# Patient Record
Sex: Female | Born: 1935 | Race: Black or African American | Hispanic: No | Smoking: Never smoker
Health system: Southern US, Community
[De-identification: ages and names within clinical notes are randomized; demographics above are authoritative.]

## PROBLEM LIST (undated history)

## (undated) DIAGNOSIS — J449 Chronic obstructive pulmonary disease, unspecified: Secondary | ICD-10-CM

## (undated) DIAGNOSIS — F0390 Unspecified dementia without behavioral disturbance: Secondary | ICD-10-CM

## (undated) DIAGNOSIS — I10 Essential (primary) hypertension: Secondary | ICD-10-CM

## (undated) DIAGNOSIS — S43005A Unspecified dislocation of left shoulder joint, initial encounter: Secondary | ICD-10-CM

## (undated) HISTORY — PX: OOPHORECTOMY: SHX86

## (undated) HISTORY — DX: Unspecified dislocation of left shoulder joint, initial encounter: S43.005A

---

## 2007-04-27 ENCOUNTER — Ambulatory Visit: Payer: Self-pay | Admitting: Emergency Medicine

## 2007-05-22 ENCOUNTER — Ambulatory Visit: Payer: Self-pay | Admitting: Internal Medicine

## 2007-06-01 ENCOUNTER — Ambulatory Visit: Payer: Self-pay | Admitting: Internal Medicine

## 2010-02-02 ENCOUNTER — Ambulatory Visit: Payer: Self-pay | Admitting: Internal Medicine

## 2010-02-02 ENCOUNTER — Inpatient Hospital Stay: Payer: Self-pay | Admitting: Internal Medicine

## 2010-10-18 ENCOUNTER — Ambulatory Visit: Payer: Self-pay | Admitting: Family Medicine

## 2010-11-05 ENCOUNTER — Ambulatory Visit: Payer: Self-pay | Admitting: Family Medicine

## 2010-11-18 ENCOUNTER — Emergency Department: Payer: Self-pay | Admitting: Emergency Medicine

## 2010-11-24 ENCOUNTER — Emergency Department: Payer: Self-pay | Admitting: Emergency Medicine

## 2010-12-21 ENCOUNTER — Ambulatory Visit: Payer: Self-pay | Admitting: Surgery

## 2010-12-28 ENCOUNTER — Ambulatory Visit: Payer: Self-pay | Admitting: Surgery

## 2011-04-29 ENCOUNTER — Ambulatory Visit: Payer: Self-pay | Admitting: Family Medicine

## 2012-11-14 ENCOUNTER — Emergency Department: Payer: Self-pay | Admitting: Emergency Medicine

## 2012-11-14 LAB — CBC
MCH: 27.5 pg (ref 26.0–34.0)
MCV: 84 fL (ref 80–100)
Platelet: 169 10*3/uL (ref 150–440)
RDW: 14.8 % — ABNORMAL HIGH (ref 11.5–14.5)
WBC: 6.5 10*3/uL (ref 3.6–11.0)

## 2012-11-14 LAB — COMPREHENSIVE METABOLIC PANEL
Albumin: 4.1 g/dL (ref 3.4–5.0)
Anion Gap: 7 (ref 7–16)
BUN: 12 mg/dL (ref 7–18)
Bilirubin,Total: 0.5 mg/dL (ref 0.2–1.0)
Chloride: 101 mmol/L (ref 98–107)
EGFR (African American): >60
Osmolality: 274 (ref 275–301)
Potassium: 3.7 mmol/L (ref 3.5–5.1)
SGOT(AST): 26 U/L (ref 15–37)
SGPT (ALT): 23 U/L (ref 12–78)
Sodium: 136 mmol/L (ref 136–145)
Total Protein: 7.8 g/dL (ref 6.4–8.2)

## 2012-11-14 LAB — URINALYSIS, COMPLETE
Bacteria: NONE SEEN
Blood: NEGATIVE
Leukocyte Esterase: NEGATIVE
Ph: 6 (ref 4.5–8.0)
Protein: 30
RBC,UR: 5 /HPF (ref 0–5)
Squamous Epithelial: <1
WBC UR: 2 /HPF (ref 0–5)

## 2012-11-14 LAB — CK TOTAL AND CKMB (NOT AT ARMC): CK, Total: 109 U/L (ref 21–215)

## 2016-04-08 ENCOUNTER — Emergency Department: Payer: Medicare Other

## 2016-04-08 ENCOUNTER — Emergency Department
Admission: EM | Admit: 2016-04-08 | Discharge: 2016-04-08 | Disposition: A | Discharge status: Home / Self Care | Payer: Medicare Other | Attending: Student | Admitting: Student

## 2016-04-08 ENCOUNTER — Encounter: Payer: Self-pay | Admitting: Emergency Medicine

## 2016-04-08 DIAGNOSIS — I10 Essential (primary) hypertension: Secondary | ICD-10-CM | POA: Diagnosis present

## 2016-04-08 DIAGNOSIS — R05 Cough: Secondary | ICD-10-CM | POA: Insufficient documentation

## 2016-04-08 DIAGNOSIS — J449 Chronic obstructive pulmonary disease, unspecified: Secondary | ICD-10-CM | POA: Insufficient documentation

## 2016-04-08 DIAGNOSIS — R059 Cough, unspecified: Secondary | ICD-10-CM

## 2016-04-08 HISTORY — DX: Chronic obstructive pulmonary disease, unspecified: J44.9

## 2016-04-08 HISTORY — DX: Essential (primary) hypertension: I10

## 2016-04-08 LAB — CBC
HEMATOCRIT: 40.2 % (ref 35.0–47.0)
HEMOGLOBIN: 13 g/dL (ref 12.0–16.0)
MCH: 27.2 pg (ref 26.0–34.0)
MCHC: 32.4 g/dL (ref 32.0–36.0)
MCV: 83.9 fL (ref 80.0–100.0)
Platelets: 176 10*3/uL (ref 150–440)
RBC: 4.79 MIL/uL (ref 3.80–5.20)
RDW: 14.9 % — ABNORMAL HIGH (ref 11.5–14.5)
WBC: 6.2 10*3/uL (ref 3.6–11.0)

## 2016-04-08 LAB — URINALYSIS COMPLETE WITH MICROSCOPIC (ARMC ONLY)
Bacteria, UA: NONE SEEN
Bilirubin Urine: NEGATIVE
Glucose, UA: NEGATIVE mg/dL
Hgb urine dipstick: NEGATIVE
Ketones, ur: NEGATIVE mg/dL
LEUKOCYTES UA: NEGATIVE
Nitrite: NEGATIVE
PH: 6 (ref 5.0–8.0)
PROTEIN: NEGATIVE mg/dL
SPECIFIC GRAVITY, URINE: 1.005 (ref 1.005–1.030)

## 2016-04-08 LAB — TROPONIN I: Troponin I: <0.03 ng/mL (ref <0.031)

## 2016-04-08 LAB — BASIC METABOLIC PANEL
Anion gap: 5 (ref 5–15)
BUN: 20 mg/dL (ref 6–20)
CO2: 30 mmol/L (ref 22–32)
Calcium: 9.5 mg/dL (ref 8.9–10.3)
Chloride: 105 mmol/L (ref 101–111)
Creatinine, Ser: 1.07 mg/dL — ABNORMAL HIGH (ref 0.44–1.00)
GFR calc Af Amer: 53 mL/min — ABNORMAL LOW (ref >60)
GFR calc non Af Amer: 46 mL/min — ABNORMAL LOW (ref >60)
GLUCOSE: 112 mg/dL — AB (ref 65–99)
Potassium: 3.7 mmol/L (ref 3.5–5.1)
Sodium: 140 mmol/L (ref 135–145)

## 2016-04-08 NOTE — ED Notes (Signed)
Pt presents with feelings of being drained and weak. States her bp has been elevated.

## 2016-04-08 NOTE — ED Provider Notes (Signed)
[facility] Emergency Department Provider Note  ____________________________________________  Time seen: Approximately 1:01 PM  I have reviewed the triage vital signs and the nursing notes.   HISTORY  Chief Complaint Hypertension    HPI Molly Parks is a 82 y.o. female with history of hypertension, GERD and COPD presents for evaluation of one week of intermittent hypertension, gradual onset, moderate, responsive to her antihypertensive medications. Patient reports that she checks her blood pressure daily. Over the past few days. she has seen a systolic as high as "180s". This typically resolves when she takes her antihypertensive medicines. Today she was concerned that her blood pressure was "too low to take my blood pressure medicines". For that reason, she presents to the emergency department. She denies any chest pain, difficulty breathing, headache, vision changes, numbness or weakness. No abdominal pain, vomiting, diarrhea. She has had some fatigue and cough productive of clear phlegm but denies any fevers.   Past Medical History  Diagnosis Date  . Hypertension   . COPD (chronic obstructive pulmonary disease) (HCC)     There are no active problems to display for this patient.   History reviewed. No pertinent past surgical history.  No current outpatient prescriptions on file.  Allergies Penicillins  No family history on file.  Social History Social History  Substance Use Topics  . Smoking status: Never Smoker   . Smokeless tobacco: None  . Alcohol Use: No    Review of Systems Constitutional: No fever/chills Eyes: No visual changes. ENT: No sore throat. Cardiovascular: Denies chest pain. Respiratory: Denies shortness of breath. Gastrointestinal: No abdominal pain.  No nausea, no vomiting.  No diarrhea.  No constipation. Genitourinary: Negative for dysuria. Musculoskeletal: Negative for back pain. Skin: Negative for rash. Neurological:  Negative for headaches, focal weakness or numbness.  10-point ROS otherwise negative.  ____________________________________________   PHYSICAL EXAM:  VITAL SIGNS: ED Triage Vitals  Enc Vitals Group     BP 04/08/16 1141 154/95 mmHg     Pulse Rate 04/08/16 1140 95     Resp 04/08/16 1140 18     Temp 04/08/16 1140 98 F (36.7 C)     Temp Source 04/08/16 1140 Oral     SpO2 04/08/16 1140 100 %     Weight 04/08/16 1140 147 lb (66.679 kg)     Height 04/08/16 1140  (1.753 m)     Head Cir --      Peak Flow --      Pain Score --      Pain Loc --      Pain Edu? --      Excl. in GC? --     Constitutional: Alert and oriented. Well appearing and in no acute distress. Eyes: Conjunctivae are normal. PERRL. EOMI. Head: Atraumatic. Nose: No congestion/rhinnorhea. Mouth/Throat: Mucous membranes are moist.  Oropharynx non-erythematous. Neck: No stridor. Supple without meningismus. Cardiovascular: Normal rate, regular rhythm. Grossly normal heart sounds.  Good peripheral circulation. Respiratory: Normal respiratory effort.  No retractions. Lungs CTAB. Gastrointestinal: Soft and nontender. No distention.  No CVA tenderness. Genitourinary: deferred Musculoskeletal: No lower extremity tenderness nor edema.  No joint effusions. Neurologic:  Normal speech and language. No gross focal neurologic deficits are appreciated. No gait instability. 5 out of 5 strength in bilateral upper and lower extremities, sensation intact to light touch throughout, cranial nerves II through XII intact. Skin:  Skin is warm, dry and intact. No rash noted. Psychiatric: Mood and affect are normal. Speech and behavior  are normal.  ____________________________________________   LABS (all labs ordered are listed, but only abnormal results are displayed)  Labs Reviewed  BASIC METABOLIC PANEL - Abnormal; Notable for the following:    Glucose, Bld 112 (*)    Creatinine, Ser 1.07 (*)    GFR calc non Af Amer 46 (*)     GFR calc Af Amer 53 (*)    All other components within normal limits  CBC - Abnormal; Notable for the following:    RDW 14.9 (*)    All other components within normal limits  URINALYSIS COMPLETEWITH MICROSCOPIC ([facility] ONLY) - Abnormal; Notable for the following:    Color, Urine YELLOW (*)    APPearance CLEAR (*)    Squamous Epithelial / LPF 0-5 (*)    All other components within normal limits  TROPONIN I   ____________________________________________  EKG  ED ECG REPORT I, Gayla DossGayle, Taevyn Hausen A, the attending physician, personally viewed and interpreted this ECG.   Date: 04/08/2016  EKG Time: 11:49  Rate: 89  Rhythm: normal sinus rhythm  Axis: normal  Intervals:none  ST&T Change: No acute ST elevation.  Motion artifact.  ____________________________________________  RADIOLOGY  CXR IMPRESSION: Negative chest. ____________________________________________   PROCEDURES  Procedure(s) performed: None  Critical Care performed: No  ____________________________________________   INITIAL IMPRESSION / ASSESSMENT AND PLAN / ED COURSE  Pertinent labs & imaging results that were available during my care of the patient were reviewed by me and considered in my medical decision making (see chart for details).  Molly Parks is a 82 y.o. female with history of hypertension, GERD and COPD presents for evaluation of one week of intermittent hypertension as well as cough. On exam, she is very well-appearing and in no acute distress. Vital signs stable, she is afebrile. She has mild to moderate hypertension here with systolic blood pressure ranging from 154-175 however she is asymptomatic. She has an intact neurological exam.BMP with mild guarding elevation 1.07, encouraged her to drink fluids. CBC remarkable troponin negative. Urinalysis consistent with infection. Chest x-ray shows no acute cardio pulmonary abnormality. No signs/symptoms supporting end organ dysfunction.  I discussed with  her that we would not make any alterations in her blood pressure medications at this time. We discussed that she will follow-up with her primary care doctor for reevaluation. We discussed return precautions, need for close follow-up and she is comfortable with the discharge plan. DC home. ____________________________________________   FINAL CLINICAL IMPRESSION(S) / ED DIAGNOSES  Final diagnoses:  Essential hypertension  Cough      Gayla DossEryka A Kinleigh Nault, MD 04/08/16 1528

## 2016-05-30 ENCOUNTER — Other Ambulatory Visit: Payer: Self-pay | Admitting: Family Medicine

## 2016-05-30 DIAGNOSIS — I714 Abdominal aortic aneurysm, without rupture, unspecified: Secondary | ICD-10-CM

## 2016-05-31 ENCOUNTER — Other Ambulatory Visit: Payer: Self-pay | Admitting: Family Medicine

## 2016-05-31 DIAGNOSIS — R1033 Periumbilical pain: Secondary | ICD-10-CM

## 2016-05-31 DIAGNOSIS — Z136 Encounter for screening for cardiovascular disorders: Secondary | ICD-10-CM

## 2016-06-06 ENCOUNTER — Ambulatory Visit: Admission: RE | Admit: 2016-06-06 | Payer: Medicare Other | Source: Ambulatory Visit

## 2016-09-12 ENCOUNTER — Other Ambulatory Visit: Payer: Self-pay | Admitting: Family Medicine

## 2016-09-12 DIAGNOSIS — Z780 Asymptomatic menopausal state: Secondary | ICD-10-CM

## 2017-01-07 ENCOUNTER — Emergency Department: Payer: Medicare Other

## 2017-01-07 ENCOUNTER — Emergency Department
Admission: EM | Admit: 2017-01-07 | Discharge: 2017-01-07 | Discharge status: Against Medical Advice | Payer: Medicare Other | Attending: Emergency Medicine | Admitting: Emergency Medicine

## 2017-01-07 ENCOUNTER — Encounter: Payer: Self-pay | Admitting: Intensive Care

## 2017-01-07 DIAGNOSIS — R112 Nausea with vomiting, unspecified: Secondary | ICD-10-CM | POA: Diagnosis not present

## 2017-01-07 DIAGNOSIS — I10 Essential (primary) hypertension: Secondary | ICD-10-CM | POA: Diagnosis not present

## 2017-01-07 DIAGNOSIS — I63431 Cerebral infarction due to embolism of right posterior cerebral artery: Secondary | ICD-10-CM | POA: Diagnosis not present

## 2017-01-07 DIAGNOSIS — R42 Dizziness and giddiness: Secondary | ICD-10-CM | POA: Diagnosis present

## 2017-01-07 DIAGNOSIS — R29810 Facial weakness: Secondary | ICD-10-CM

## 2017-01-07 DIAGNOSIS — J449 Chronic obstructive pulmonary disease, unspecified: Secondary | ICD-10-CM | POA: Diagnosis not present

## 2017-01-07 LAB — URINALYSIS, COMPLETE (UACMP) WITH MICROSCOPIC
BACTERIA UA: NONE SEEN
BILIRUBIN URINE: NEGATIVE
Glucose, UA: 50 mg/dL — AB
Hgb urine dipstick: NEGATIVE
KETONES UR: 20 mg/dL — AB
Nitrite: NEGATIVE
PROTEIN: 30 mg/dL — AB
SPECIFIC GRAVITY, URINE: 1.014 (ref 1.005–1.030)
pH: 7 (ref 5.0–8.0)

## 2017-01-07 LAB — COMPREHENSIVE METABOLIC PANEL
ALT: 10 U/L — ABNORMAL LOW (ref 14–54)
ANION GAP: 7 (ref 5–15)
AST: 20 U/L (ref 15–41)
Albumin: 3.9 g/dL (ref 3.5–5.0)
Alkaline Phosphatase: 42 U/L (ref 38–126)
BUN: 16 mg/dL (ref 6–20)
CHLORIDE: 104 mmol/L (ref 101–111)
CO2: 27 mmol/L (ref 22–32)
Calcium: 9 mg/dL (ref 8.9–10.3)
Creatinine, Ser: 0.99 mg/dL (ref 0.44–1.00)
GFR calc Af Amer: 58 mL/min — ABNORMAL LOW (ref >60)
GFR calc non Af Amer: 50 mL/min — ABNORMAL LOW (ref >60)
Glucose, Bld: 131 mg/dL — ABNORMAL HIGH (ref 65–99)
Potassium: 4.1 mmol/L (ref 3.5–5.1)
SODIUM: 138 mmol/L (ref 135–145)
Total Bilirubin: 0.8 mg/dL (ref 0.3–1.2)
Total Protein: 7 g/dL (ref 6.5–8.1)

## 2017-01-07 LAB — LIPASE, BLOOD: LIPASE: 18 U/L (ref 11–51)

## 2017-01-07 LAB — CBC
HEMATOCRIT: 41.7 % (ref 35.0–47.0)
HEMOGLOBIN: 13.9 g/dL (ref 12.0–16.0)
MCH: 28 pg (ref 26.0–34.0)
MCHC: 33.4 g/dL (ref 32.0–36.0)
MCV: 83.8 fL (ref 80.0–100.0)
Platelets: 208 10*3/uL (ref 150–440)
RBC: 4.98 MIL/uL (ref 3.80–5.20)
RDW: 13.7 % (ref 11.5–14.5)
WBC: 6.1 10*3/uL (ref 3.6–11.0)

## 2017-01-07 MED ORDER — MECLIZINE HCL 25 MG PO TABS
25.0000 mg | ORAL_TABLET | Freq: Once | ORAL | Status: AC
  Administered 2017-01-07: 25 mg via ORAL
  Filled 2017-01-07: qty 1

## 2017-01-07 MED ORDER — IOPAMIDOL (ISOVUE-370) INJECTION 76%
75.0000 mL | Freq: Once | INTRAVENOUS | Status: AC | PRN
Start: 2017-01-07 — End: 2017-01-07
  Administered 2017-01-07: 75 mL via INTRAVENOUS

## 2017-01-07 MED ORDER — MECLIZINE HCL 25 MG PO TABS: 25.0000 mg | ORAL_TABLET | Freq: Three times a day (TID) | ORAL | 0 refills | Status: DC | PRN

## 2017-01-07 MED ORDER — SODIUM CHLORIDE 0.9 % IV BOLUS (SEPSIS)
500.0000 mL | Freq: Once | INTRAVENOUS | Status: AC
  Administered 2017-01-07: 500 mL via INTRAVENOUS

## 2017-01-07 MED ORDER — ONDANSETRON HCL 4 MG/2ML IJ SOLN
4.0000 mg | Freq: Once | INTRAMUSCULAR | Status: AC
  Administered 2017-01-07: 4 mg via INTRAVENOUS
  Filled 2017-01-07: qty 2

## 2017-01-07 MED ORDER — ONDANSETRON 4 MG PO TBDP: 4.0000 mg | ORAL_TABLET | Freq: Three times a day (TID) | ORAL | 0 refills | Status: DC | PRN

## 2017-01-07 NOTE — ED Notes (Signed)
Patient in CT

## 2017-01-07 NOTE — Progress Notes (Signed)
CH had just completed visiting Pt in [place], when a family member of Pt in [place] asked if he could speak with him. CH talked to the family member. Pt's family wanted a Nurse to come in and help the Pt. CH called the Nurse to help the Pt.    01/07/17 1600  Clinical Encounter Type  Visited With Patient;Family  Visit Type Initial;Code;Trauma  Referral From Family  Consult/Referral To Chaplain  Spiritual Encounters  Spiritual Needs Prayer;Other (Comment)

## 2017-01-07 NOTE — ED Provider Notes (Signed)
[facility] Emergency Department Provider Note  ____________________________________________  Time seen: Approximately 10:48 AM  I have reviewed the triage vital signs and the nursing notes.   HISTORY  Chief Complaint Dizziness and Emesis    HPI Molly Parks is a 83 y.o. female with a history of HTN, COPD, presenting for dizziness and vomiting. The patient reports that she was preparing Cheerios in her kitchen around 7 AM, and when she turned, she became acutely "off balance, I almost fell," with associated nausea and did have one episode of emesis in the emergency department. She denies any headache, numbness tingling or weakness, blurred or double vision, speech changes, or recent trauma. No recent URI symptoms. No fever or chills.No recent changes in medications.   Past Medical History:  Diagnosis Date  . COPD (chronic obstructive pulmonary disease) (HCC)   . Hypertension     There are no active problems to display for this patient.   History reviewed. No pertinent surgical history.    Allergies Penicillins  History reviewed. No pertinent family history.  Social History Social History  Substance Use Topics  . Smoking status: Never Smoker  . Smokeless tobacco: Never Used  . Alcohol use No    Review of Systems Constitutional: No fever/chills.No trauma. No recent illness. Positive dizziness. Eyes: No visual changes. No blurred or double vision. ENT: No sore throat. No congestion or rhinorrhea. Cardiovascular: Denies chest pain. Denies palpitations. Respiratory: Denies shortness of breath.  No cough. Gastrointestinal: No abdominal pain.  Positive nausea, positive vomiting.  No diarrhea.  No constipation. Genitourinary: Negative for dysuria. Musculoskeletal: Negative for back pain. Skin: Negative for rash. Neurological: Negative for headaches. No focal numbness, tingling or weakness. Positive difficulty walking due to  imbalance.  10-point ROS otherwise negative.  ____________________________________________   PHYSICAL EXAM:  VITAL SIGNS: ED Triage Vitals  Enc Vitals Group     BP 01/07/17 1034 (!) 188/92     Pulse Rate 01/07/17 1034 75     Resp 01/07/17 1034 (!) 36     Temp 01/07/17 1034 97.6 F (36.4 C)     Temp Source 01/07/17 1034 Oral     SpO2 01/07/17 1034 100 %     Weight 01/07/17 1036 137 lb (62.1 kg)     Height 01/07/17 1036 5' 9.5" (1.765 m)     Head Circumference --      Peak Flow --      Pain Score --      Pain Loc --      Pain Edu? --      Excl. in GC? --     Constitutional: Patient is alert and able to answer questions appropriately. Her speech is clear, and she is comfortable appearing. Chronically ill appearing, cachectic, but nontoxic. Eyes: Conjunctivae are normal.  EOMI. PERRLA. No scleral icterus. Head: Atraumatic. Nose: No congestion/rhinnorhea. Mouth/Throat: Mucous membranes are dry.  Neck: No stridor.  Supple.  No JVD. Cardiovascular: Normal rate, regular rhythm with occasional extra beat. No murmurs, rubs or gallops.  Respiratory: Normal respiratory effort.  No accessory muscle use or retractions. Lungs CTAB.  No wheezes, rales or ronchi. Gastrointestinal: Soft, nontender and nondistended.  No guarding or rebound.  No peritoneal signs. Musculoskeletal: No LE edema. No ttp in the calves or palpable cords.  Negative Homan's sign. Neurologic: Alert. Speech is clear.  Loss of right nasolabial fold with normal wrinkles on the forehead bilaterally. Smile is symmetric and tongue is midline. EOMI. PERRLA. No horizontal or vertical  nystagmus and extraocular movements do not reproduce dizziness. No pronator drift. 5 out of 5 grip, biceps, triceps, hip flexors, plantar flexion and dorsiflexion. Normal sensation to light touch in the bilateral upper and lower extremities, and face. Normal heel-to-shin. Normal finger-nose-finger without ataxia. Skin:  Skin is warm, dry and intact.  No rash noted. Psychiatric: Mood and affect are normal. Speech and behavior are normal.  Normal judgement.  ____________________________________________   LABS (all labs ordered are listed, but only abnormal results are displayed)  Labs Reviewed  URINALYSIS, COMPLETE (UACMP) WITH MICROSCOPIC - Abnormal; Notable for the following:       Result Value   Color, Urine YELLOW (*)    APPearance CLEAR (*)    Glucose, UA 50 (*)    Ketones, ur 20 (*)    Protein, ur 30 (*)    Leukocytes, UA TRACE (*)    Squamous Epithelial / LPF 0-5 (*)    All other components within normal limits  COMPREHENSIVE METABOLIC PANEL - Abnormal; Notable for the following:    Glucose, Bld 131 (*)    ALT 10 (*)    GFR calc non Af Amer 50 (*)    GFR calc Af Amer 58 (*)    All other components within normal limits  CBC  LIPASE, BLOOD   ____________________________________________  EKG  Not completed prior to leaving AMA  ____________________________________________  RADIOLOGY  Ct Angio Head W Or Wo Contrast  Result Date: 01/07/2017 CLINICAL DATA:  Dizziness, weakness, nausea upon waking. History of hypertension. EXAM: CT ANGIOGRAPHY HEAD AND NECK TECHNIQUE: Multidetector CT imaging of the head and neck was performed using the standard protocol during bolus administration of intravenous contrast. Multiplanar CT image reconstructions and MIPs were obtained to evaluate the vascular anatomy. Carotid stenosis measurements (when applicable) are obtained utilizing NASCET criteria, using the distal internal carotid diameter as the denominator. CONTRAST:  75 cc Isovue 370 COMPARISON:  None. FINDINGS: CT HEAD FINDINGS BRAIN: Patchy blurring of the RIGHT medial occipital lobe, RIGHT occipital lobe. Multiple old small cerebellar infarcts. Old small RIGHT caudate lacunar infarct. The ventricles and sulci are normal for age. No intraparenchymal hemorrhage, mass effect nor midline shift. Patchy supratentorial white matter  hypodensities less than expected for patient's age, though non-specific are most compatible with chronic small vessel ischemic disease. No abnormal extra-axial fluid collections. Basal cisterns are patent. VASCULAR: Unremarkable. SKULL: No skull fracture. No significant scalp soft tissue swelling. SINUSES/ORBITS: The mastoid air-cells and included paranasal sinuses are well-aerated.The included ocular globes and orbital contents are non-suspicious. OTHER: None. CTA NECK AORTIC ARCH: Normal appearance of the thoracic arch, normal branch pattern. Mild calcific atherosclerosis of the aortic arch. The origins of the innominate, left Common carotid artery and subclavian artery are widely patent. RIGHT CAROTID SYSTEM: Common carotid artery is widely patent, coursing in a straight line fashion. Normal appearance of the carotid bifurcation without hemodynamically significant stenosis by NASCET criteria. 2 mm posteriorly directed out patching RIGHT external carotid artery origin. Normal appearance of the included internal carotid artery. LEFT CAROTID SYSTEM: Common carotid artery is widely patent, coursing in a straight line fashion. Normal appearance of the carotid bifurcation without hemodynamically significant stenosis by NASCET criteria, trace calcific atherosclerosis. Normal appearance of the included internal carotid artery. VERTEBRAL ARTERIES:Codominant vertebral artery's. Normal appearance of the vertebral arteries, which appear widely patent. SKELETON: No acute osseous process though bone windows have not been submitted. Severe C3-4 through C6-7 degenerative discs resulting in moderate to severe LEFT C3-4, C4-5, C5-6 neural  foraminal narrowing. OTHER NECK: Soft tissues of the neck are non-acute though, not tailored for evaluation. CTA HEAD ANTERIOR CIRCULATION: Normal appearance of the cervical internal carotid arteries, petrous, cavernous and supra clinoid internal carotid arteries. Widely patent anterior  communicating artery, anterior and middle cerebral arteries. Mild luminal irregularity of the anterior cerebral arteries, to lesser extent mid and distal middle cerebral arteries. No large vessel occlusion, hemodynamically significant stenosis, dissection, contrast extravasation or aneurysm. POSTERIOR CIRCULATION: Patent vertebral arteries, vertebrobasilar junction and basilar artery, as well as main branch vessels. Patent posterior cerebral arteries. Mild luminal irregularity RIGHT greater than LEFT posterior cerebral artery mid and distal segments. Small LEFT and tiny RIGHT posterior communicating arteries are present. No large vessel occlusion, hemodynamically significant stenosis, dissection, contrast extravasation or aneurysm. VENOUS SINUSES: Major dural venous sinuses are patent though not tailored for evaluation on this angiographic examination. ANATOMIC VARIANTS: None. DELAYED PHASE: No abnormal intracranial enhancement. IMPRESSION: CT HEAD: Age indeterminate patchy small RIGHT posterior cerebral artery/ watershed infarcts. Old RIGHT basal ganglia and small cerebellar infarcts. Mild chronic small vessel ischemic disease. CTA NECK: No hemodynamically significant stenosis or acute vascular process. 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 CTA HEAD:  No emergent large vessel occlusion or severe stenosis. Mild intracranial atherosclerosis. Electronically Signed   By: Awilda Metro M.D.   On: 01/07/2017 13:47   Dg Chest 2 View  Result Date: 01/07/2017 CLINICAL DATA:  Weakness, dizziness and nausea since this morning. Vomiting. Productive cough. EXAM: CHEST  2 VIEW COMPARISON:  04/08/2016 FINDINGS: Lungs are adequately inflated without consolidation or effusion. Cardiomediastinal silhouette is within normal. There is minimal calcified plaque over the thoracic aorta. There are mild degenerative changes of the spine. Remainder of the exam is unchanged. IMPRESSION: No active  cardiopulmonary disease. Electronically Signed   By: Elberta Fortis M.D.   On: 01/07/2017 11:56   Ct Angio Neck W And/or Wo Contrast  Result Date: 01/07/2017 CLINICAL DATA:  Dizziness, weakness, nausea upon waking. History of hypertension. EXAM: CT ANGIOGRAPHY HEAD AND NECK TECHNIQUE: Multidetector CT imaging of the head and neck was performed using the standard protocol during bolus administration of intravenous contrast. Multiplanar CT image reconstructions and MIPs were obtained to evaluate the vascular anatomy. Carotid stenosis measurements (when applicable) are obtained utilizing NASCET criteria, using the distal internal carotid diameter as the denominator. CONTRAST:  75 cc Isovue 370 COMPARISON:  None. FINDINGS: CT HEAD FINDINGS BRAIN: Patchy blurring of the RIGHT medial occipital lobe, RIGHT occipital lobe. Multiple old small cerebellar infarcts. Old small RIGHT caudate lacunar infarct. The ventricles and sulci are normal for age. No intraparenchymal hemorrhage, mass effect nor midline shift. Patchy supratentorial white matter hypodensities less than expected for patient's age, though non-specific are most compatible with chronic small vessel ischemic disease. No abnormal extra-axial fluid collections. Basal cisterns are patent. VASCULAR: Unremarkable. SKULL: No skull fracture. No significant scalp soft tissue swelling. SINUSES/ORBITS: The mastoid air-cells and included paranasal sinuses are well-aerated.The included ocular globes and orbital contents are non-suspicious. OTHER: None. CTA NECK AORTIC ARCH: Normal appearance of the thoracic arch, normal branch pattern. Mild calcific atherosclerosis of the aortic arch. The origins of the innominate, left Common carotid artery and subclavian artery are widely patent. RIGHT CAROTID SYSTEM: Common carotid artery is widely patent, coursing in a straight line fashion. Normal appearance of the carotid bifurcation without hemodynamically significant stenosis by  NASCET criteria. 2 mm posteriorly directed out patching RIGHT external carotid artery origin. Normal appearance of the included internal carotid  artery. LEFT CAROTID SYSTEM: Common carotid artery is widely patent, coursing in a straight line fashion. Normal appearance of the carotid bifurcation without hemodynamically significant stenosis by NASCET criteria, trace calcific atherosclerosis. Normal appearance of the included internal carotid artery. VERTEBRAL ARTERIES:Codominant vertebral artery's. Normal appearance of the vertebral arteries, which appear widely patent. SKELETON: No acute osseous process though bone windows have not been submitted. Severe C3-4 through C6-7 degenerative discs resulting in moderate to severe LEFT C3-4, C4-5, C5-6 neural foraminal narrowing. OTHER NECK: Soft tissues of the neck are non-acute though, not tailored for evaluation. CTA HEAD ANTERIOR CIRCULATION: Normal appearance of the cervical internal carotid arteries, petrous, cavernous and supra clinoid internal carotid arteries. Widely patent anterior communicating artery, anterior and middle cerebral arteries. Mild luminal irregularity of the anterior cerebral arteries, to lesser extent mid and distal middle cerebral arteries. No large vessel occlusion, hemodynamically significant stenosis, dissection, contrast extravasation or aneurysm. POSTERIOR CIRCULATION: Patent vertebral arteries, vertebrobasilar junction and basilar artery, as well as main branch vessels. Patent posterior cerebral arteries. Mild luminal irregularity RIGHT greater than LEFT posterior cerebral artery mid and distal segments. Small LEFT and tiny RIGHT posterior communicating arteries are present. No large vessel occlusion, hemodynamically significant stenosis, dissection, contrast extravasation or aneurysm. VENOUS SINUSES: Major dural venous sinuses are patent though not tailored for evaluation on this angiographic examination. ANATOMIC VARIANTS: None. DELAYED  PHASE: No abnormal intracranial enhancement. IMPRESSION: CT HEAD: Age indeterminate patchy small RIGHT posterior cerebral artery/ watershed infarcts. Old RIGHT basal ganglia and small cerebellar infarcts. Mild chronic small vessel ischemic disease. CTA NECK: No hemodynamically significant stenosis or acute vascular process. 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 CTA HEAD:  No emergent large vessel occlusion or severe stenosis. Mild intracranial atherosclerosis. Electronically Signed   By: Awilda Metro M.D.   On: 01/07/2017 13:47    ____________________________________________   PROCEDURES  Procedure(s) performed: None  Procedures  Critical Care performed: No ____________________________________________   INITIAL IMPRESSION / ASSESSMENT AND PLAN / ED COURSE  Pertinent labs & imaging results that were available during my care of the patient were reviewed by me and considered in my medical decision making (see chart for details).  83 y.o. female with a history of HTN presenting with acute onset dizziness, loss of right nasolabial fold on my examination. This patient may have a peripheral cause of vertigo, but I am also concerned about a central stroke so a get a CT angiogram the head and neck for further evaluation. I will treat the patient symptomatically to see if it can improve her symptoms. Basic labs are pending, as well as urinalysis.  ----------------------------------------- 3:21 PM on 01/07/2017 -----------------------------------------  After meclizine and Zofran, the patient states she was feeling significantly better. Her blood pressure, hypertension, even improved. However, her CT scan did show an age-indeterminate infarct in the posterior cerebral artery. I spoke with Dr. Thad Ranger, the neurologist on-call, who recommended MRI imaging for further evaluation.  I went to speak with the patient, her son, and another family member to let them know about  the concern about an age-indeterminate infarct, especially in the setting of facial droop and acute dizziness. After long discussion, the patient refused MRI and left AMA. She refused MRI because she did not want to be "in the tube." I gave her a full description of the MRI scanner, as well as offered an anti-anxiolytic or possible admission for sedation in order to perform this test, and she refused. We had a long discussion about the  risk of stroke, repeat or worsening stroke, long-term and short-term neurologic disability, and death, and she still did not want to stay for further evaluation.  ____________________________________________  FINAL CLINICAL IMPRESSION(S) / ED DIAGNOSES  Final diagnoses:  Cerebral infarction due to embolism of right posterior cerebral artery (HCC)  Dizziness  Non-intractable vomiting with nausea, unspecified vomiting type  Facial droop    Clinical Course       NEW MEDICATIONS STARTED DURING THIS VISIT:  Discharge Medication List as of 01/07/2017  2:30 PM    START taking these medications   Details  meclizine (ANTIVERT) 25 MG tablet Take 1 tablet (25 mg total) by mouth 3 (three) times daily as needed for dizziness., Starting Tue 01/07/2017, Print    ondansetron (ZOFRAN ODT) 4 MG disintegrating tablet Take 1 tablet (4 mg total) by mouth every 8 (eight) hours as needed for nausea or vomiting., Starting Tue 01/07/2017, Print          Rockne Menghini, MD 01/07/17 1529

## 2017-01-07 NOTE — ED Triage Notes (Signed)
PAtient presents from home with dizziness, weakness and nausea upon awakening. EMS v/s WNL. Blood sugar 135. Patient A&O x4. Emesis X1 during triage

## 2017-01-07 NOTE — Discharge Instructions (Signed)
Today you are leaving the hospital AGAINST MEDICAL ADVICE. There are signs of stroke on her CT scan, and the only way to know if this stroke is new or old is to get an MRI. You do not want the MRI. If you leave, you risk having worsening symptoms, new symptoms, or death.  Return to the emergency department at any time for continued evaluation, new headache, visual changes, speech changes, difficulty walking, fainting, numbness tingling or weakness, or any other symptoms concerning to you.

## 2017-02-04 ENCOUNTER — Other Ambulatory Visit: Payer: Self-pay | Admitting: Family Medicine

## 2017-02-04 DIAGNOSIS — Z780 Asymptomatic menopausal state: Secondary | ICD-10-CM

## 2017-07-31 ENCOUNTER — Emergency Department
Admission: EM | Admit: 2017-07-31 | Discharge: 2017-08-01 | Disposition: A | Discharge status: Home / Self Care | Payer: Medicare Other | Attending: Emergency Medicine | Admitting: Emergency Medicine

## 2017-07-31 ENCOUNTER — Encounter: Payer: Self-pay | Admitting: Emergency Medicine

## 2017-07-31 ENCOUNTER — Emergency Department: Payer: Medicare Other

## 2017-07-31 DIAGNOSIS — N390 Urinary tract infection, site not specified: Secondary | ICD-10-CM | POA: Diagnosis not present

## 2017-07-31 DIAGNOSIS — I10 Essential (primary) hypertension: Secondary | ICD-10-CM | POA: Insufficient documentation

## 2017-07-31 DIAGNOSIS — Z79899 Other long term (current) drug therapy: Secondary | ICD-10-CM | POA: Insufficient documentation

## 2017-07-31 DIAGNOSIS — R531 Weakness: Secondary | ICD-10-CM | POA: Insufficient documentation

## 2017-07-31 DIAGNOSIS — J449 Chronic obstructive pulmonary disease, unspecified: Secondary | ICD-10-CM | POA: Diagnosis not present

## 2017-07-31 DIAGNOSIS — R5383 Other fatigue: Secondary | ICD-10-CM | POA: Diagnosis present

## 2017-07-31 MED ORDER — SODIUM CHLORIDE 0.9 % IV BOLUS (SEPSIS)
500.0000 mL | Freq: Once | INTRAVENOUS | Status: AC
  Administered 2017-08-01: 500 mL via INTRAVENOUS

## 2017-07-31 NOTE — ED Provider Notes (Signed)
[facility] Emergency Department Provider Note   ____________________________________________   First MD Initiated Contact with Patient 07/31/17 2333     (approximate)  I have reviewed the triage vital signs and the nursing notes.   HISTORY  Chief Complaint Fatigue    HPI Molly Parks is a 83 y.o. female who presents to the ED from home with a chief complaint of generalized weakness and urinary frequency. Symptoms onset approximately 3 PM today. Patient reports occasional nausea, none currently. Reports cough productive of clear sputum. Ambulates with a cane and states she has not been so weak as to cause fall or nonambulation today.Denies associated fever, chills, chest pain, shortness of breath, abdominal pain, vomiting, diarrhea. Denies recent travel or trauma. Nothing makes her symptoms better or worse.   Past Medical History:  Diagnosis Date  . COPD (chronic obstructive pulmonary disease) (HCC)   . Hypertension     There are no active problems to display for this patient.   History reviewed. No pertinent surgical history.  Prior to Admission medications   Medication Sig Start Date End Date Taking? Authorizing Provider  ciprofloxacin (CIPRO) 250 MG tablet Take 1 tablet (250 mg total) by mouth 2 (two) times daily. 08/01/17   Irean Hong, MD  meclizine (ANTIVERT) 25 MG tablet Take 1 tablet (25 mg total) by mouth 3 (three) times daily as needed for dizziness. 01/07/17   Rockne Menghini, MD  ondansetron (ZOFRAN ODT) 4 MG disintegrating tablet Take 1 tablet (4 mg total) by mouth every 8 (eight) hours as needed for nausea or vomiting. 08/01/17   Irean Hong, MD    Allergies Penicillins  History reviewed. No pertinent family history.  Social History Social History  Substance Use Topics  . Smoking status: Never Smoker  . Smokeless tobacco: Never Used  . Alcohol use No    Review of Systems  Constitutional: Positive for generalized  weakness. No fever/chills Eyes: No visual changes. ENT: No sore throat. Cardiovascular: Denies chest pain. Respiratory: Positive for cough with clear sputum. Denies shortness of breath. Gastrointestinal: No abdominal pain.  Positive for nausea, no vomiting.  No diarrhea.  No constipation. Genitourinary: Positive for urinary frequency. Negative for dysuria. Musculoskeletal: Negative for back pain. Skin: Negative for rash. Neurological: Negative for headaches, focal weakness or numbness.   ____________________________________________   PHYSICAL EXAM:  VITAL SIGNS: ED Triage Vitals  Enc Vitals Group     BP 07/31/17 2306 (!) 154/81     Pulse Rate 07/31/17 2306 75     Resp 07/31/17 2306 15     Temp 07/31/17 2306 97.8 F (36.6 C)     Temp Source 07/31/17 2306 Oral     SpO2 07/31/17 2306 100 %     Weight 07/31/17 2308 143 lb (64.9 kg)     Height 07/31/17 2308 5' 9.5" (1.765 m)     Head Circumference --      Peak Flow --      Pain Score 07/31/17 2306 0     Pain Loc --      Pain Edu? --      Excl. in GC? --     Constitutional: Alert and oriented. Frail appearing and in no acute distress. Eyes: Conjunctivae are normal. PERRL. EOMI. Head: Atraumatic. Nose: No congestion/rhinnorhea. Mouth/Throat: Mucous membranes are moist.  Oropharynx non-erythematous. Neck: No stridor.   Cardiovascular: Normal rate, regular rhythm. Grossly normal heart sounds.  Good peripheral circulation. Respiratory: Normal respiratory effort.  No retractions. Lungs  CTAB. Gastrointestinal: Soft and nontender to light or deep palpation. No distention. No abdominal bruits. No CVA tenderness. Musculoskeletal: No lower extremity tenderness nor edema.  No joint effusions. Neurologic:  Normal speech and language. No gross focal neurologic deficits are appreciated.  Skin:  Skin is warm, dry and intact. No rash noted. Psychiatric: Mood and affect are normal. Speech and behavior are  normal.  ____________________________________________   LABS (all labs ordered are listed, but only abnormal results are displayed)  Labs Reviewed  CBC WITH DIFFERENTIAL/PLATELET - Abnormal; Notable for the following:       Result Value   MCHC 31.9 (*)    RDW 15.1 (*)    Lymphs Abs 0.8 (*)    Monocytes Absolute 0.1 (*)    All other components within normal limits  URINALYSIS, COMPLETE (UACMP) WITH MICROSCOPIC - Abnormal; Notable for the following:    Color, Urine YELLOW (*)    APPearance CLEAR (*)    Glucose, UA 150 (*)    Ketones, ur 20 (*)    Protein, ur 30 (*)    Leukocytes, UA MODERATE (*)    Squamous Epithelial / LPF 0-5 (*)    All other components within normal limits  COMPREHENSIVE METABOLIC PANEL - Abnormal; Notable for the following:    Glucose, Bld 152 (*)    GFR calc non Af Amer 60 (*)    All other components within normal limits  TROPONIN I  LIPASE, BLOOD   ____________________________________________  EKG  ED ECG REPORT I, Daiya Tamer J, the attending physician, personally viewed and interpreted this ECG.   Date: 07/31/2017  EKG Time: 2308  Rate: 73  Rhythm: normal EKG, normal sinus rhythm  Axis: Normal  Intervals:none  ST&T Change: Nonspecific  ____________________________________________  RADIOLOGY  Dg Chest Port 1 View  Result Date: 08/01/2017 CLINICAL DATA:  Acute onset of generalized fatigue and nausea. Initial encounter. EXAM: PORTABLE CHEST 1 VIEW COMPARISON:  Chest radiograph performed 01/07/2017 FINDINGS: The lungs are well-aerated and clear. There is no evidence of focal opacification, pleural effusion or pneumothorax. The cardiomediastinal silhouette is within normal limits. No acute osseous abnormalities are seen. Clips are noted within the right upper quadrant, reflecting prior cholecystectomy. IMPRESSION: No acute cardiopulmonary process seen. Electronically Signed   By: Roanna Raider M.D.   On: 08/01/2017 00:33     ____________________________________________   PROCEDURES  Procedure(s) performed: None  Procedures  Critical Care performed: No  ____________________________________________   INITIAL IMPRESSION / ASSESSMENT AND PLAN / ED COURSE  Pertinent labs & imaging results that were available during my care of the patient were reviewed by me and considered in my medical decision making (see chart for details).  83 year old female with COPD and hypertension who presents with generalized weakness and urinary frequency. Will obtain screening lab work and urinalysis, chest x-ray and reassess.  Clinical Course as of Aug 02 419  Fri Aug 01, 2017  1610 Updated patient her son on urine results. Patient reports anaphylaxis with penicillin so we'll avoid penicillin and cephalosporins altogether. Awaiting lab results.  [JS]  0138 Patient feeling overall improved. Updated patient and son of laboratory results. We'll discharge home with prescriptions for Cipro, Zofran and patient will follow-up with her PCP closely. Strict return precautions given. Both verbalize understanding and agree with plan of care.  [JS]  0153 Prescription for Colace given per patient's request.  [JS]    Clinical Course User Index [JS] Irean Hong, MD     ____________________________________________   FINAL CLINICAL  IMPRESSION(S) / ED DIAGNOSES  Final diagnoses:  Generalized weakness  Lower urinary tract infectious disease      NEW MEDICATIONS STARTED DURING THIS VISIT:  New Prescriptions   CIPROFLOXACIN (CIPRO) 250 MG TABLET    Take 1 tablet (250 mg total) by mouth 2 (two) times daily.   ONDANSETRON (ZOFRAN ODT) 4 MG DISINTEGRATING TABLET    Take 1 tablet (4 mg total) by mouth every 8 (eight) hours as needed for nausea or vomiting.     Note:  This document was prepared using Dragon voice recognition software and may include unintentional dictation errors.    Irean HongSung, Atalya Dano J, MD 08/01/17 [phone]

## 2017-07-31 NOTE — ED Triage Notes (Signed)
Pt presents to ED via EMS from home with c/o generalize fatigue since 3pm today. Not focal neuro symptoms noted. Pt also reports reports nausea but no vomiting.

## 2017-08-01 ENCOUNTER — Telehealth: Payer: Self-pay | Admitting: Emergency Medicine

## 2017-08-01 DIAGNOSIS — R531 Weakness: Secondary | ICD-10-CM | POA: Diagnosis not present

## 2017-08-01 LAB — CBC WITH DIFFERENTIAL/PLATELET
BASOS ABS: 0 10*3/uL (ref 0–0.1)
BASOS PCT: 0 %
EOS PCT: 0 %
Eosinophils Absolute: 0 10*3/uL (ref 0–0.7)
HEMATOCRIT: 39.9 % (ref 35.0–47.0)
Hemoglobin: 12.7 g/dL (ref 12.0–16.0)
Lymphocytes Relative: 11 %
Lymphs Abs: 0.8 10*3/uL — ABNORMAL LOW (ref 1.0–3.6)
MCH: 26.8 pg (ref 26.0–34.0)
MCHC: 31.9 g/dL — ABNORMAL LOW (ref 32.0–36.0)
MCV: 84 fL (ref 80.0–100.0)
MONO ABS: 0.1 10*3/uL — AB (ref 0.2–0.9)
Monocytes Relative: 2 %
NEUTROS ABS: 6.5 10*3/uL (ref 1.4–6.5)
Neutrophils Relative %: 87 %
PLATELETS: 171 10*3/uL (ref 150–440)
RBC: 4.74 MIL/uL (ref 3.80–5.20)
RDW: 15.1 % — AB (ref 11.5–14.5)
WBC: 7.5 10*3/uL (ref 3.6–11.0)

## 2017-08-01 LAB — COMPREHENSIVE METABOLIC PANEL
ALK PHOS: 53 U/L (ref 38–126)
ALT: 17 U/L (ref 14–54)
ANION GAP: 9 (ref 5–15)
AST: 26 U/L (ref 15–41)
Albumin: 4.2 g/dL (ref 3.5–5.0)
BILIRUBIN TOTAL: 0.7 mg/dL (ref 0.3–1.2)
BUN: 18 mg/dL (ref 6–20)
CALCIUM: 9.4 mg/dL (ref 8.9–10.3)
CO2: 26 mmol/L (ref 22–32)
Chloride: 101 mmol/L (ref 101–111)
Creatinine, Ser: 0.85 mg/dL (ref 0.44–1.00)
GFR, EST NON AFRICAN AMERICAN: 60 mL/min — AB (ref >60)
Glucose, Bld: 152 mg/dL — ABNORMAL HIGH (ref 65–99)
Potassium: 4.1 mmol/L (ref 3.5–5.1)
Sodium: 136 mmol/L (ref 135–145)
TOTAL PROTEIN: 7.7 g/dL (ref 6.5–8.1)

## 2017-08-01 LAB — URINALYSIS, COMPLETE (UACMP) WITH MICROSCOPIC
BILIRUBIN URINE: NEGATIVE
Bacteria, UA: NONE SEEN
Glucose, UA: 150 mg/dL — AB
HGB URINE DIPSTICK: NEGATIVE
Ketones, ur: 20 mg/dL — AB
NITRITE: NEGATIVE
PH: 7 (ref 5.0–8.0)
Protein, ur: 30 mg/dL — AB
SPECIFIC GRAVITY, URINE: 1.014 (ref 1.005–1.030)

## 2017-08-01 LAB — TROPONIN I: Troponin I: <0.03 ng/mL (ref <0.03)

## 2017-08-01 LAB — LIPASE, BLOOD: LIPASE: 26 U/L (ref 11–51)

## 2017-08-01 MED ORDER — CIPROFLOXACIN IN D5W 400 MG/200ML IV SOLN
400.0000 mg | Freq: Once | INTRAVENOUS | Status: AC
  Administered 2017-08-01: 400 mg via INTRAVENOUS
  Filled 2017-08-01: qty 200

## 2017-08-01 MED ORDER — CIPROFLOXACIN HCL 250 MG PO TABS: 250.0000 mg | ORAL_TABLET | Freq: Two times a day (BID) | ORAL | 0 refills | Status: DC

## 2017-08-01 MED ORDER — ONDANSETRON 4 MG PO TBDP: 4.0000 mg | ORAL_TABLET | Freq: Three times a day (TID) | ORAL | 0 refills | Status: DC | PRN

## 2017-08-01 MED ORDER — DOCUSATE SODIUM 100 MG PO CAPS: 100.0000 mg | ORAL_CAPSULE | Freq: Two times a day (BID) | ORAL | 0 refills | Status: DC

## 2017-08-01 NOTE — Telephone Encounter (Signed)
[facility] called to say pt is allergic to cipro.  Spoke with dr York Ceriseforbach and he would like to have rx changed to nitrofurantoin 100 mg po twice daily for 7 days.  Change called to [facility].

## 2017-08-01 NOTE — ED Provider Notes (Signed)
-----------------------------------------   11:45 AM on 08/01/2017 -----------------------------------------  Pharmacy called and they have a documented history of Cipro allergy.  Dr. Ardine BjorkSung's note also indicates that the patient has anaphylaxis to [facility], so it would be wise to avoid cephalosporins as well.  Though not the recommended treatment, the best alternative (as per ID recommendations) is nitrofurantoin 100 mg PO BID, and I will provide a 7-day course.  Marisue IvanLiz (ED RN) is calling in the prescription and updating the patient's allergy list.   Loleta RoseForbach, Azan Maneri, MD 08/01/17 1149

## 2017-08-01 NOTE — Discharge Instructions (Signed)
1. Take antibiotic as prescribed (Cipro 250 mg twice daily 7 days). 2. Take Zofran as needed for nausea. 3. Drink plenty of fluids daily. 4. Return to the ER for worsening symptoms, persistent vomiting, difficulty breathing or other concerns.

## 2017-11-03 ENCOUNTER — Emergency Department: Payer: Medicare Other

## 2017-11-03 ENCOUNTER — Other Ambulatory Visit: Payer: Self-pay

## 2017-11-03 ENCOUNTER — Inpatient Hospital Stay
Admission: EM | Admit: 2017-11-03 | Discharge: 2017-11-05 | DRG: 564 | Disposition: A | Discharge status: Home Health | Payer: Medicare Other | Attending: Internal Medicine | Admitting: Internal Medicine

## 2017-11-03 DIAGNOSIS — I10 Essential (primary) hypertension: Secondary | ICD-10-CM | POA: Diagnosis present

## 2017-11-03 DIAGNOSIS — S43005A Unspecified dislocation of left shoulder joint, initial encounter: Secondary | ICD-10-CM | POA: Diagnosis present

## 2017-11-03 DIAGNOSIS — Z881 Allergy status to other antibiotic agents status: Secondary | ICD-10-CM | POA: Diagnosis not present

## 2017-11-03 DIAGNOSIS — Z8249 Family history of ischemic heart disease and other diseases of the circulatory system: Secondary | ICD-10-CM

## 2017-11-03 DIAGNOSIS — I499 Cardiac arrhythmia, unspecified: Secondary | ICD-10-CM

## 2017-11-03 DIAGNOSIS — E876 Hypokalemia: Secondary | ICD-10-CM | POA: Diagnosis present

## 2017-11-03 DIAGNOSIS — T796XXA Traumatic ischemia of muscle, initial encounter: Secondary | ICD-10-CM | POA: Diagnosis present

## 2017-11-03 DIAGNOSIS — Z7982 Long term (current) use of aspirin: Secondary | ICD-10-CM | POA: Diagnosis not present

## 2017-11-03 DIAGNOSIS — F419 Anxiety disorder, unspecified: Secondary | ICD-10-CM | POA: Diagnosis present

## 2017-11-03 DIAGNOSIS — Y92019 Unspecified place in single-family (private) house as the place of occurrence of the external cause: Secondary | ICD-10-CM | POA: Diagnosis not present

## 2017-11-03 DIAGNOSIS — M21332 Wrist drop, left wrist: Secondary | ICD-10-CM

## 2017-11-03 DIAGNOSIS — F329 Major depressive disorder, single episode, unspecified: Secondary | ICD-10-CM | POA: Diagnosis present

## 2017-11-03 DIAGNOSIS — J449 Chronic obstructive pulmonary disease, unspecified: Secondary | ICD-10-CM | POA: Diagnosis present

## 2017-11-03 DIAGNOSIS — W010XXA Fall on same level from slipping, tripping and stumbling without subsequent striking against object, initial encounter: Secondary | ICD-10-CM | POA: Diagnosis present

## 2017-11-03 DIAGNOSIS — Z833 Family history of diabetes mellitus: Secondary | ICD-10-CM

## 2017-11-03 DIAGNOSIS — S4292XA Fracture of left shoulder girdle, part unspecified, initial encounter for closed fracture: Secondary | ICD-10-CM

## 2017-11-03 DIAGNOSIS — Z809 Family history of malignant neoplasm, unspecified: Secondary | ICD-10-CM | POA: Diagnosis not present

## 2017-11-03 DIAGNOSIS — Z880 Allergy status to penicillin: Secondary | ICD-10-CM | POA: Diagnosis not present

## 2017-11-03 DIAGNOSIS — M6282 Rhabdomyolysis: Secondary | ICD-10-CM | POA: Diagnosis present

## 2017-11-03 DIAGNOSIS — G5632 Lesion of radial nerve, left upper limb: Secondary | ICD-10-CM | POA: Diagnosis present

## 2017-11-03 DIAGNOSIS — I214 Non-ST elevation (NSTEMI) myocardial infarction: Secondary | ICD-10-CM | POA: Diagnosis present

## 2017-11-03 LAB — CBC WITH DIFFERENTIAL/PLATELET
Basophils Absolute: 0 10*3/uL (ref 0–0.1)
Basophils Relative: 0 %
EOS PCT: 0 %
Eosinophils Absolute: 0 10*3/uL (ref 0–0.7)
HEMATOCRIT: 43.6 % (ref 35.0–47.0)
Hemoglobin: 13.7 g/dL (ref 12.0–16.0)
LYMPHS PCT: 3 %
Lymphs Abs: 0.4 10*3/uL — ABNORMAL LOW (ref 1.0–3.6)
MCH: 26.9 pg (ref 26.0–34.0)
MCHC: 31.3 g/dL — AB (ref 32.0–36.0)
MCV: 85.9 fL (ref 80.0–100.0)
MONO ABS: 0.6 10*3/uL (ref 0.2–0.9)
MONOS PCT: 4 %
NEUTROS ABS: 12.3 10*3/uL — AB (ref 1.4–6.5)
Neutrophils Relative %: 93 %
PLATELETS: 173 10*3/uL (ref 150–440)
RBC: 5.07 MIL/uL (ref 3.80–5.20)
RDW: 14.9 % — AB (ref 11.5–14.5)
WBC: 13.3 10*3/uL — ABNORMAL HIGH (ref 3.6–11.0)

## 2017-11-03 LAB — BASIC METABOLIC PANEL
Anion gap: 14 (ref 5–15)
BUN: 16 mg/dL (ref 6–20)
CALCIUM: 9.6 mg/dL (ref 8.9–10.3)
CO2: 24 mmol/L (ref 22–32)
CREATININE: 0.95 mg/dL (ref 0.44–1.00)
Chloride: 100 mmol/L — ABNORMAL LOW (ref 101–111)
GFR calc Af Amer: >60 mL/min (ref >60)
GFR, EST NON AFRICAN AMERICAN: 52 mL/min — AB (ref >60)
GLUCOSE: 143 mg/dL — AB (ref 65–99)
Potassium: 3.3 mmol/L — ABNORMAL LOW (ref 3.5–5.1)
Sodium: 138 mmol/L (ref 135–145)

## 2017-11-03 LAB — CK: Total CK: 774 U/L — ABNORMAL HIGH (ref 38–234)

## 2017-11-03 LAB — TROPONIN I: Troponin I: 0.3 ng/mL (ref <0.03)

## 2017-11-03 LAB — PROTIME-INR
INR: 1.02
Prothrombin Time: 13.3 seconds (ref 11.4–15.2)

## 2017-11-03 LAB — APTT: aPTT: 25 seconds (ref 24–36)

## 2017-11-03 MED ORDER — POTASSIUM CHLORIDE IN NACL 40-0.9 MEQ/L-% IV SOLN
INTRAVENOUS | Status: DC
  Administered 2017-11-03 – 2017-11-04 (×2): 100 mL/h via INTRAVENOUS
  Filled 2017-11-03 (×4): qty 1000

## 2017-11-03 MED ORDER — SODIUM CHLORIDE 0.9 % IV BOLUS (SEPSIS)
500.0000 mL | Freq: Once | INTRAVENOUS | Status: AC
  Administered 2017-11-03: 500 mL via INTRAVENOUS

## 2017-11-03 MED ORDER — LIDOCAINE HCL (PF) 1 % IJ SOLN
INTRAMUSCULAR | Status: AC
  Filled 2017-11-03: qty 5

## 2017-11-03 MED ORDER — ONDANSETRON HCL 4 MG/2ML IJ SOLN
4.0000 mg | Freq: Once | INTRAMUSCULAR | Status: AC
  Administered 2017-11-03: 4 mg via INTRAVENOUS
  Filled 2017-11-03: qty 2

## 2017-11-03 MED ORDER — ENOXAPARIN SODIUM 80 MG/0.8ML ~~LOC~~ SOLN
1.0000 mg/kg | Freq: Two times a day (BID) | SUBCUTANEOUS | Status: DC
  Administered 2017-11-03 – 2017-11-05 (×4): 65 mg via SUBCUTANEOUS
  Filled 2017-11-03 (×4): qty 0.8

## 2017-11-03 MED ORDER — MECLIZINE HCL 25 MG PO TABS
25.0000 mg | ORAL_TABLET | Freq: Three times a day (TID) | ORAL | Status: DC | PRN
  Filled 2017-11-03: qty 1

## 2017-11-03 MED ORDER — MIDAZOLAM HCL 5 MG/5ML IJ SOLN
INTRAMUSCULAR | Status: AC
  Filled 2017-11-03: qty 5

## 2017-11-03 MED ORDER — ENOXAPARIN SODIUM 40 MG/0.4ML ~~LOC~~ SOLN: 40.0000 mg | SUBCUTANEOUS | Status: DC

## 2017-11-03 MED ORDER — HYDRALAZINE HCL 20 MG/ML IJ SOLN
10.0000 mg | Freq: Four times a day (QID) | INTRAMUSCULAR | Status: DC | PRN
Start: 2017-11-03 — End: 2017-11-05

## 2017-11-03 MED ORDER — NITROGLYCERIN 2 % TD OINT
0.5000 [in_us] | TOPICAL_OINTMENT | Freq: Four times a day (QID) | TRANSDERMAL | Status: DC
  Administered 2017-11-04 (×3): 0.5 [in_us] via TOPICAL
  Filled 2017-11-03 (×3): qty 1

## 2017-11-03 MED ORDER — ACETAMINOPHEN 650 MG RE SUPP
650.0000 mg | Freq: Four times a day (QID) | RECTAL | Status: DC | PRN
Start: 2017-11-03 — End: 2017-11-05

## 2017-11-03 MED ORDER — ACETAMINOPHEN 325 MG PO TABS
650.0000 mg | ORAL_TABLET | Freq: Four times a day (QID) | ORAL | Status: DC | PRN
  Administered 2017-11-04: 650 mg via ORAL
  Filled 2017-11-03: qty 2

## 2017-11-03 MED ORDER — DOCUSATE SODIUM 100 MG PO CAPS
100.0000 mg | ORAL_CAPSULE | Freq: Two times a day (BID) | ORAL | Status: DC
  Administered 2017-11-03 – 2017-11-05 (×4): 100 mg via ORAL
  Filled 2017-11-03 (×4): qty 1

## 2017-11-03 MED ORDER — ONDANSETRON HCL 4 MG/2ML IJ SOLN: 4.0000 mg | Freq: Four times a day (QID) | INTRAMUSCULAR | Status: DC | PRN

## 2017-11-03 MED ORDER — ONDANSETRON HCL 4 MG PO TABS: 4.0000 mg | ORAL_TABLET | Freq: Four times a day (QID) | ORAL | Status: DC | PRN

## 2017-11-03 MED ORDER — MORPHINE SULFATE (PF) 2 MG/ML IV SOLN
2.0000 mg | Freq: Once | INTRAVENOUS | Status: AC
  Administered 2017-11-03: 2 mg via INTRAVENOUS
  Filled 2017-11-03: qty 1

## 2017-11-03 NOTE — ED Triage Notes (Signed)
Pt presents today via [facility] post fall. EMD state pt was found in floor by son to whom she lives with. PT states son had not been home since yesterday, and she fell around 2711 today. Pt denies LOC or hitting head.

## 2017-11-03 NOTE — H&P (Signed)
Molly Parks is an 83 y.o. female.   Chief Complaint: Fall HPI: The patient with past medical history of hypertension and COPD presents to the emergency department via EMS after a fall at her nursing home.  The patient was on the floor for some time.  She denies hitting her head but cannot remember the circumstances of her fall.  She denies pain anywhere at this time.  CT of her head was negative in the emergency department.  At first the admitting staff was going to workup potential syncope but laboratory evaluation revealed that the patient had an NTEMI for which the hospitalist service was called for further management.  Past Medical History:  Diagnosis Date  . COPD (chronic obstructive pulmonary disease) ([place])   . Hypertension     Past Surgical History:  Procedure Laterality Date  . OOPHORECTOMY Right    Patient is not forthcoming with information  No family history on file. Patient is reluctant to talk Social History:  reports that  has never smoked. she has never used smokeless tobacco. She reports that she does not drink alcohol. Her drug history is not on file.  Allergies:  Allergies  Allergen Reactions  . Ciprofloxacin   . Penicillins     Medications Prior to Admission  Medication Sig Dispense Refill  . aspirin 325 MG EC tablet Take 325 mg daily by mouth.    . clonazePAM (KLONOPIN) 0.5 MG tablet Take 0.25 mg 2 (two) times daily as needed by mouth.   0  . ciprofloxacin (CIPRO) 250 MG tablet Take 1 tablet (250 mg total) by mouth 2 (two) times daily. (Patient not taking: Reported on 11/03/2017) 14 tablet 0  . docusate sodium (COLACE) 100 MG capsule Take 1 capsule (100 mg total) by mouth 2 (two) times daily. (Patient not taking: Reported on 11/03/2017) 10 capsule 0  . meclizine (ANTIVERT) 25 MG tablet Take 1 tablet (25 mg total) by mouth 3 (three) times daily as needed for dizziness. (Patient not taking: Reported on 11/03/2017) 12 tablet 0  . ondansetron (ZOFRAN ODT) 4 MG  disintegrating tablet Take 1 tablet (4 mg total) by mouth every 8 (eight) hours as needed for nausea or vomiting. (Patient not taking: Reported on 11/03/2017) 20 tablet 0    Results for orders placed or performed during the hospital encounter of 11/03/17 (from the past 48 hour(s))  CBC with Differential     Status: Abnormal   Collection Time: 11/03/17  7:18 PM  Result Value Ref Range   WBC 13.3 (H) 3.6 - 11.0 K/uL   RBC 5.07 3.80 - 5.20 MIL/uL   Hemoglobin 13.7 12.0 - 16.0 g/dL   HCT 43.6 35.0 - 47.0 %   MCV 85.9 80.0 - 100.0 fL   MCH 26.9 26.0 - 34.0 pg   MCHC 31.3 (L) 32.0 - 36.0 g/dL   RDW 14.9 (H) 11.5 - 14.5 %   Platelets 173 150 - 440 K/uL   Neutrophils Relative % 93 %   Neutro Abs 12.3 (H) 1.4 - 6.5 K/uL   Lymphocytes Relative 3 %   Lymphs Abs 0.4 (L) 1.0 - 3.6 K/uL   Monocytes Relative 4 %   Monocytes Absolute 0.6 0.2 - 0.9 K/uL   Eosinophils Relative 0 %   Eosinophils Absolute 0.0 0 - 0.7 K/uL   Basophils Relative 0 %   Basophils Absolute 0.0 0 - 0.1 K/uL  Basic metabolic panel     Status: Abnormal   Collection Time: 11/03/17  7:18 PM  Result Value Ref Range   Sodium 138 135 - 145 mmol/L   Potassium 3.3 (L) 3.5 - 5.1 mmol/L   Chloride 100 (L) 101 - 111 mmol/L   CO2 24 22 - 32 mmol/L   Glucose, Bld 143 (H) 65 - 99 mg/dL   BUN 16 6 - 20 mg/dL   Creatinine, Ser 0.95 0.44 - 1.00 mg/dL   Calcium 9.6 8.9 - 10.3 mg/dL   GFR calc non Af Amer 52 (L) >60 mL/min   GFR calc Af Amer >60 >60 mL/min    Comment: (NOTE) The eGFR has been calculated using the CKD EPI equation. This calculation has not been validated in all clinical situations. eGFR's persistently <60 mL/min signify possible Chronic Kidney Disease.    Anion gap 14 5 - 15  CK     Status: Abnormal   Collection Time: 11/03/17  7:18 PM  Result Value Ref Range   Total CK 774 (H) 38 - 234 U/L  Troponin I     Status: Abnormal   Collection Time: 11/03/17  7:18 PM  Result Value Ref Range   Troponin I 0.30 (HH) <0.03  ng/mL    Comment: CRITICAL RESULT CALLED TO, READ BACK BY AND VERIFIED WITH AMY COHEN ON 11/03/17 AT 2043 [facility]   Protime-INR     Status: None   Collection Time: 11/03/17 11:09 PM  Result Value Ref Range   Prothrombin Time 13.3 11.4 - 15.2 seconds   INR 1.02   APTT     Status: None   Collection Time: 11/03/17 11:09 PM  Result Value Ref Range   aPTT 25 24 - 36 seconds   Dg Elbow Complete Left  Result Date: 11/03/2017 CLINICAL DATA:  Fall. EXAM: LEFT ELBOW - COMPLETE 3+ VIEW COMPARISON:  None. FINDINGS: There is no evidence of fracture, dislocation, or joint effusion. There is no evidence of arthropathy or other focal bone abnormality. Soft tissues are unremarkable. IMPRESSION: Negative. Electronically Signed   By: Titus Dubin M.D.   On: 11/03/2017 19:33   Dg Wrist Complete Left  Result Date: 11/03/2017 CLINICAL DATA:  Fall.  Unable to move wrist. EXAM: LEFT WRIST - COMPLETE 3+ VIEW COMPARISON:  None. FINDINGS: There is no evidence of fracture or dislocation. Moderate degenerative changes of first CMC joint. Osteopenia. Soft tissues are unremarkable. IMPRESSION: Negative. Electronically Signed   By: Titus Dubin M.D.   On: 11/03/2017 19:36   Ct Head Wo Contrast  Result Date: 11/03/2017 CLINICAL DATA:  83 year old female with unwitnessed fall. EXAM: CT HEAD WITHOUT CONTRAST TECHNIQUE: Contiguous axial images were obtained from the base of the skull through the vertex without intravenous contrast. COMPARISON:  Head CT dated 01/07/2017 FINDINGS: Brain: There is moderate age-related atrophy and chronic microvascular ischemic changes. 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 Small focus of low-attenuation in the left cerebellar hemisphere, likely an old lacunar infarct. There is no acute intracranial hemorrhage. No mass effect or midline shift noted. No extra-axial fluid collection. Vascular: No hyperdense vessel or unexpected calcification. Skull:  Normal. Negative for fracture or focal lesion. Sinuses/Orbits: There is mucoperiosteal thickening and partial opacification of the right sphenoid sinus. Remainder of the visualized paranasal sinuses and the mastoid air cells are clear. Other: None IMPRESSION: 1. No acute intracranial hemorrhage. 2. Age-related atrophy chronic microvascular ischemic changes. 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 Electronically Signed   By: Anner Crete M.D.   On: 11/03/2017 20:04   Dg Shoulder Left  Result Date: 11/03/2017 CLINICAL DATA:  Postreduction for shoulder dislocation. EXAM: LEFT SHOULDER - 2+ VIEW COMPARISON:  Earlier same day FINDINGS: Three views study shows interval reduction of the humeral head dislocation. Hill-Sachs deformity evident. Tiny fragments in the acromiohumeral space suggests age indeterminate cortical fracture fragments. IMPRESSION: 1. Interval reduction of the left shoulder dislocation with Hill-Sachs deformity evident. 2. Possible intra-articular loose bodies/small fracture fragments. Electronically Signed   By: Misty Stanley M.D.   On: 11/03/2017 20:23   Dg Shoulder Left  Result Date: 11/03/2017 CLINICAL DATA:  Fall. EXAM: LEFT SHOULDER - 2+ VIEW COMPARISON:  None. FINDINGS: Acute anterior dislocation of the humeral head with respect to the glenoid. Probable Hill-Sachs deformity. Mild degenerative changes of the acromioclavicular joint. IMPRESSION: Acute anterior shoulder dislocation with probable Hill-Sachs deformity. Electronically Signed   By: Titus Dubin M.D.   On: 11/03/2017 19:34    Review of Systems  Constitutional: Negative for chills and fever.  HENT: Negative for sore throat and tinnitus.   Eyes: Negative for blurred vision and redness.  Respiratory: Negative for cough and shortness of breath.   Cardiovascular: Negative for chest pain, palpitations, orthopnea and PND.  Gastrointestinal: Negative for abdominal pain, diarrhea, nausea and vomiting.   Genitourinary: Negative for dysuria, frequency and urgency.  Musculoskeletal: Positive for falls. Negative for joint pain and myalgias.  Skin: Negative for rash.       No lesions  Neurological: Negative for speech change, focal weakness and weakness.  Endo/Heme/Allergies: Does not bruise/bleed easily.       No temperature intolerance  Psychiatric/Behavioral: Negative for depression and suicidal ideas.    Blood pressure (!) 160/78, pulse 76, temperature 97.9 F (36.6 C), temperature source Oral, resp. rate 18, height 5' 7" (1.702 m), weight 63.5 kg (140 lb), SpO2 100 %. Physical Exam  Vitals reviewed. Constitutional: She is oriented to person, place, and time. She appears well-developed and well-nourished. No distress.  HENT:  Head: Normocephalic and atraumatic.  Mouth/Throat: Oropharynx is clear and moist.  Eyes: Conjunctivae and EOM are normal. Pupils are equal, round, and reactive to light. No scleral icterus.  Neck: Normal range of motion. Neck supple. No JVD present. No tracheal deviation present. No thyromegaly present.  Cardiovascular: Normal rate, regular rhythm and normal heart sounds. Exam reveals no gallop and no friction rub.  No murmur heard. Respiratory: Effort normal and breath sounds normal.  GI: Soft. Bowel sounds are normal. She exhibits no distension. There is no tenderness.  Genitourinary:  Genitourinary Comments: Deferred  Musculoskeletal: Normal range of motion. She exhibits no edema.  Lymphadenopathy:    She has no cervical adenopathy.  Neurological: She is alert and oriented to person, place, and time. No cranial nerve deficit. She exhibits normal muscle tone.  Skin: Skin is warm and dry. No rash noted. No erythema.  Psychiatric: She has a normal mood and affect. Her behavior is normal. Judgment and thought content normal.     Assessment/Plan This is an 83 year old female admitted for NSTEMI. 1.  NSTEMI: Troponin elevated to 0.3; the patient is chest  pain-free at this time.  Continue to follow cardiac biomarkers.  Manage blood pressure.  Continue to monitor telemetry.  Consult cardiology.  Cardiogram ordered. 2.  Rhabdomyolysis: Mildly elevated CK.  May be contributing to the elevation in troponin however will check MB fraction as well.  Continue to hydrate with intravenous fluid. 3.  Hypertension: Uncontrolled.  Continue hydralazine.  I will placed Nitropaste on the patient's chest. 4.  Falls: The  patient takes meclizine as an outpatient.  Continue for in hospital stay as well as likely outpatient physical therapy. 5.  Depression: Initiate antidepressant as the patient is nearly catatonic at times. 6.  DVT prophylaxis: Therapeutic Lovenox 7.  GI prophylaxis: None The patient is a full code.  Time spent on admission orders and patient care proximally 45 minutes  Harrie Foreman, MD 11/03/2017, 11:47 PM

## 2017-11-03 NOTE — Progress Notes (Signed)
Talked to Dr. Sheryle Hailiamond about patient's BP of 160/78, order to give IV hydralazine PRN if SBP is greater than 160. No other concern at the moment. RN will continue to monitor.

## 2017-11-03 NOTE — ED Notes (Signed)
Changed patient and bed linens. 

## 2017-11-03 NOTE — ED Provider Notes (Signed)
[facility] Emergency Department Provider Note  ____________________________________________   First MD Initiated Contact with Patient 11/03/17 1819     (approximate)  I have reviewed the triage vital signs and the nursing notes.   HISTORY  Chief Complaint Fall   HPI Molly Parks is a 83 y.o. female with a history of COPD and hypertension who is presenting to the emergency department after a fall.  The patient is unsure of the exact time of the fall but the son thinks that she may have fallen at about 12 noon today.  She was then down for several hours.  The patient reports that she may have lost her balance and fallen into a door frame.  She is reporting pain now to her left elbow as well as her left shoulder.  Says the left arm secondary to pain but unable to move the left wrist secondary to weakness.  She says that over the past several hours she has had progressive weakness to the left wrist and is unable to move it now but can move her fingers to the left hand.  She denies hitting her head or having a headache.  Says that she takes a 324 mg aspirin every evening.  Denies any history of atrial fibrillation.   Past Medical History:  Diagnosis Date  . COPD (chronic obstructive pulmonary disease) (HCC)   . Hypertension     There are no active problems to display for this patient.   No past surgical history on file.  Prior to Admission medications   Medication Sig Start Date End Date Taking? Authorizing Provider  ciprofloxacin (CIPRO) 250 MG tablet Take 1 tablet (250 mg total) by mouth 2 (two) times daily. 08/01/17   Irean Hong, MD  docusate sodium (COLACE) 100 MG capsule Take 1 capsule (100 mg total) by mouth 2 (two) times daily. 08/01/17   Irean Hong, MD  meclizine (ANTIVERT) 25 MG tablet Take 1 tablet (25 mg total) by mouth 3 (three) times daily as needed for dizziness. 01/07/17   Rockne Menghini, MD  ondansetron (ZOFRAN ODT) 4 MG  disintegrating tablet Take 1 tablet (4 mg total) by mouth every 8 (eight) hours as needed for nausea or vomiting. 08/01/17   Irean Hong, MD    Allergies Ciprofloxacin and Penicillins  No family history on file.  Social History Social History   Tobacco Use  . Smoking status: Never Smoker  . Smokeless tobacco: Never Used  Substance Use Topics  . Alcohol use: No  . Drug use: Not on file    Review of Systems  Constitutional: No fever/chills Eyes: No visual changes. ENT: No sore throat. Cardiovascular: Denies chest pain. Respiratory: Denies shortness of breath. Gastrointestinal: No abdominal pain.  No nausea, no vomiting.  No diarrhea.  No constipation. Genitourinary: Negative for dysuria. Musculoskeletal: Negative for back pain. Skin: Negative for rash. Neurological: Negative for headaches, focal weakness or numbness.   ____________________________________________   PHYSICAL EXAM:  VITAL SIGNS: ED Triage Vitals [11/03/17 1743]  Enc Vitals Group     BP (!) 158/110     Pulse Rate 87     Resp 19     Temp 98.1 F (36.7 C)     Temp Source Oral     SpO2 95 %     Weight 140 lb (63.5 kg)     Height 5\' 7"  (1.702 m)     Head Circumference      Peak Flow  Pain Score      Pain Loc      Pain Edu?      Excl. in GC?     Constitutional: Alert and oriented. Well appearing and in no acute distress. Eyes: Conjunctivae are normal.  Head: Atraumatic. Nose: No congestion/rhinnorhea. Mouth/Throat: Mucous membranes are moist.  Neck: No stridor.  No tenderness to palpation of the cervical spine.  No deformity or step-off. Cardiovascular: Normal rate, regular rhythm. Grossly normal heart sounds.  Good peripheral circulation with an intact radial pulse to the left wrist. Respiratory: Normal respiratory effort.  No retractions. Lungs CTAB. Gastrointestinal: Soft and nontender. No distention. No CVA tenderness. Musculoskeletal: No lower extremity tenderness nor edema.  No joint  effusions.  5 out of 5 strength bilateral lower extreme is.  No limb shortening.  Pelvis is stable and without tenderness to the bilateral hips.  Left elbow with ecchymosis overlying the dorsal aspect.  The patient is holding her left elbow in flexion.  The left wrist is limp and being held in flexion as well and is flaccid.  However, there is 5 out of 5 strength of the fingers to the left hand with sensation that is intact to light touch.  Also with tenderness to palpation to the proximal left humerus without any deformity.  However, I am unable to range left shoulder secondary to pain.  Patient is also numb over the left lateral deltoid region.  Otherwise sensate to light touch to the left upper extremity. Neurologic:  Normal speech and language.  Skin:  Skin is warm, dry and intact. No rash noted. Psychiatric: Mood and affect are normal. Speech and behavior are normal.  ____________________________________________   LABS (all labs ordered are listed, but only abnormal results are displayed)  Labs Reviewed  CBC WITH DIFFERENTIAL/PLATELET  BASIC METABOLIC PANEL  PROTIME-INR  APTT  CK  URINALYSIS, COMPLETE (UACMP) WITH MICROSCOPIC   ____________________________________________  EKG  ED ECG REPORT I, Roy Snuffer,  Teena Iraniavid M, the attending physician, personally viewed and interpreted this ECG.   Date: 11/03/2017  EKG Time: 1744  Rate: 80  Rhythm: atrial fibrillation, rate 80  Axis: Normal  Intervals:none  ST&T Change: No ST segment elevation or depression.  No abnormal T wave inversion.  EKG machine reads minimal ST elevation in lateral leads.  However, this is likely from artifact.  ED ECG REPORT I, Arelia LongestSchaevitz,  Ashyr Hedgepath M, the attending physician, personally viewed and interpreted this ECG.   Date: 11/03/2017  EKG Time: 2000  Rate: 75  Rhythm: normal sinus rhythm  Axis: Normal  Intervals:none  ST&T Change: No ST segment elevation or depression.  No abnormal T wave  inversion.   ____________________________________________  RADIOLOGY  No acute finding on the ct of the head.    Left ant shoulder dislocation.  No acute finding to the left wrist/elbow.    Likely with Hill-Sachs deformity.  Postreduction film shows reduced left shoulder.   ____________________________________________   PROCEDURES  Procedure(s) performed:  Reduction of dislocation Date/Time: 8:09 PM Performed by: Arelia LongestSchaevitz,  Grainger Mccarley M Authorized by: Arelia LongestSchaevitz,  Marlan Steward M Consent: Verbal consent obtained. Risks and benefits: risks, benefits and alternatives were discussed Consent given by: patient Required items: required blood products, implants, devices, and special equipment available Time out: Immediately prior to procedure a "time out" was called to verify the correct patient, procedure, equipment, support staff and site/side marked as required.  Patient sedated: none Intra-articular left shoulder block from a left lateral approach.  Patient was prepped with chlorhexidine.  Injected with 1% lidocaine, 15 mL's from the lateral approach.  Good maroon flash of blood was achieved.  Vitals: Vital signs were monitored during sedation. Patient tolerance: Patient tolerated the procedure well with no immediate complications. Joint: left shoulder  Reduction technique: modified hennepin with small amount of counter traction  Patient placed in left shoulder immobilizer status post reduction.  Also placed in Velcro left forearm spica splint secondary to still with a left wrist drop.  However, her sensation did return to the left lateral deltoid status post reduction.  Procedures  Critical Care performed:   ____________________________________________   INITIAL IMPRESSION / ASSESSMENT AND PLAN / ED COURSE  Pertinent labs & imaging results that were available during my care of the patient were reviewed by me and considered in my medical decision making (see chart for  details).  DDX: Syncope, mechanical fall, proximal humerus fracture, elbow fracture, nerve injury, intracranial hemorrhage, UTI, new onset atrial fibrillation  As part of my medical decision making, I reviewed the following data within the electronic MEDICAL RECORD NUMBER Old chart reviewed  ----------------------------------------- 8:12 PM on 11/03/2017 -----------------------------------------  Patient to be admitted to the hospital.  Questionable A. fib upon arrival with syncope versus mechanical fall.  Patient also with mild rhabdomyolysis.  Signed out to Dr. Sheryle Haildiamond.  Patient and family aware of need for admission in the hospital.      ____________________________________________   FINAL CLINICAL IMPRESSION(S) / ED DIAGNOSES  Fall.  Rhabdomyolysis.  Arrhythmia.  Left shoulder dislocation.  Left-sided wrist drop.    NEW MEDICATIONS STARTED DURING THIS VISIT:  This SmartLink is deprecated. Use AVSMEDLIST instead to display the medication list for a patient.   Note:  This document was prepared using Dragon voice recognition software and may include unintentional dictation errors.     Myrna BlazerSchaevitz, Analysia Dungee Matthew, MD 11/03/17 2014

## 2017-11-03 NOTE — ED Triage Notes (Signed)
Pt states she takes Asprin daily. Pt states she has not taken her BP medication today she only takes it as needed.

## 2017-11-03 NOTE — ED Notes (Signed)
Hooked patient up to monitor. 

## 2017-11-04 LAB — URINALYSIS, COMPLETE (UACMP) WITH MICROSCOPIC
Bacteria, UA: NONE SEEN
Bilirubin Urine: NEGATIVE
Glucose, UA: NEGATIVE mg/dL
Ketones, ur: NEGATIVE mg/dL
Leukocytes, UA: NEGATIVE
Nitrite: NEGATIVE
Protein, ur: NEGATIVE mg/dL
Specific Gravity, Urine: 1.002 — ABNORMAL LOW (ref 1.005–1.030)
Squamous Epithelial / LPF: NONE SEEN
pH: 7 (ref 5.0–8.0)

## 2017-11-04 LAB — BASIC METABOLIC PANEL
Anion gap: 9 (ref 5–15)
BUN: 12 mg/dL (ref 6–20)
CHLORIDE: 104 mmol/L (ref 101–111)
CO2: 25 mmol/L (ref 22–32)
Calcium: 8.8 mg/dL — ABNORMAL LOW (ref 8.9–10.3)
Creatinine, Ser: 1.07 mg/dL — ABNORMAL HIGH (ref 0.44–1.00)
GFR calc Af Amer: 53 mL/min — ABNORMAL LOW (ref >60)
GFR calc non Af Amer: 45 mL/min — ABNORMAL LOW (ref >60)
GLUCOSE: 113 mg/dL — AB (ref 65–99)
POTASSIUM: 4 mmol/L (ref 3.5–5.1)
SODIUM: 138 mmol/L (ref 135–145)

## 2017-11-04 LAB — TROPONIN I
Troponin I: 0.99 ng/mL (ref <0.03)
Troponin I: 0.99 ng/mL (ref <0.03)

## 2017-11-04 LAB — CK: CK TOTAL: 1603 U/L — AB (ref 38–234)

## 2017-11-04 LAB — CKMB (ARMC ONLY): CK, MB: 27.1 ng/mL — ABNORMAL HIGH (ref 0.5–5.0)

## 2017-11-04 LAB — TSH: TSH: 1.045 u[IU]/mL (ref 0.350–4.500)

## 2017-11-04 MED ORDER — METOPROLOL TARTRATE 25 MG PO TABS
12.5000 mg | ORAL_TABLET | Freq: Two times a day (BID) | ORAL | Status: DC
  Administered 2017-11-04 – 2017-11-05 (×2): 12.5 mg via ORAL
  Filled 2017-11-04 (×2): qty 1

## 2017-11-04 MED ORDER — IBUPROFEN 400 MG PO TABS
600.0000 mg | ORAL_TABLET | Freq: Four times a day (QID) | ORAL | Status: DC | PRN
  Administered 2017-11-04: 600 mg via ORAL
  Filled 2017-11-04: qty 2

## 2017-11-04 MED ORDER — HYDROCODONE-ACETAMINOPHEN 5-325 MG PO TABS: 1.0000 | ORAL_TABLET | ORAL | Status: DC | PRN

## 2017-11-04 MED ORDER — CLONAZEPAM 0.5 MG PO TABS
0.2500 mg | ORAL_TABLET | Freq: Two times a day (BID) | ORAL | Status: DC
  Administered 2017-11-04: 0.25 mg via ORAL
  Filled 2017-11-04 (×2): qty 1

## 2017-11-04 MED ORDER — ASPIRIN EC 81 MG PO TBEC
81.0000 mg | DELAYED_RELEASE_TABLET | Freq: Every day | ORAL | Status: DC
  Administered 2017-11-04 – 2017-11-05 (×2): 81 mg via ORAL
  Filled 2017-11-04 (×2): qty 1

## 2017-11-04 MED ORDER — SODIUM CHLORIDE 0.9 % IV SOLN
INTRAVENOUS | Status: DC
  Administered 2017-11-04 – 2017-11-05 (×3): via INTRAVENOUS

## 2017-11-04 NOTE — Progress Notes (Signed)
Patient ID: Molly Parks, female   DOB: 11/02/1928, 83 y.o.   MRN: [id number]  [employer] PROGRESS NOTE  Molly Perchearl V Rosenbach NWG:[id number] DOB: 01/26/1928 DOA: 11/03/2017 PCP: Dione Housekeeperlmedo, Mario Ernesto, MD  HPI/Subjective: Patient had a fall and dislocated her shoulder and now cannot extend her fingers on her left hand.  The patient states that she was on the ground for a long period of time until her son came home.  She was trying to crawl and it was very difficult.  She had her shoulder reduced in the ER.  Objective: Vitals:   11/04/17 0808 11/04/17 1658  BP: 131/65 (!) 150/79  Pulse: 78 96  Resp:  18  Temp: 98 F (36.7 C) 98 F (36.7 C)  SpO2: 100% 100%    Filed Weights   11/03/17 1743 11/03/17 2234 11/04/17 0600  Weight: 63.5 kg (140 lb) 63.5 kg (140 lb) 63.8 kg (140 lb 9.6 oz)    ROS: Review of Systems  Constitutional: Negative for chills and fever.  Eyes: Negative for blurred vision.  Respiratory: Negative for cough and shortness of breath.   Cardiovascular: Negative for chest pain.  Gastrointestinal: Negative for abdominal pain, constipation, diarrhea, nausea and vomiting.  Genitourinary: Negative for dysuria.  Musculoskeletal: Positive for joint pain.  Neurological: Negative for dizziness and headaches.   Exam: Physical Exam  Constitutional: She is oriented to person, place, and time.  HENT:  Nose: No mucosal edema.  Mouth/Throat: No oropharyngeal exudate or posterior oropharyngeal edema.  Eyes: Conjunctivae, EOM and lids are normal. Pupils are equal, round, and reactive to light.  Neck: No JVD present. Carotid bruit is not present. No edema present. No thyroid mass and no thyromegaly present.  Cardiovascular: S1 normal and S2 normal. Exam reveals no gallop.  No murmur heard. Pulses:      Dorsalis pedis pulses are 2+ on the right side, and 2+ on the left side.  Respiratory: No respiratory distress. She has no wheezes. She has no rhonchi. She has no rales.  GI:  Soft. Bowel sounds are normal. There is no tenderness.  Musculoskeletal:       Right ankle: She exhibits no swelling.       Left ankle: She exhibits no swelling.  Lymphadenopathy:    She has no cervical adenopathy.  Neurological: She is alert and oriented to person, place, and time. No cranial nerve deficit.  Left wrist drop.  Patient can extend her fingers on her left hand.  Skin: Skin is warm. No rash noted. Nails show no clubbing.  Psychiatric: She has a normal mood and affect.      Data Reviewed: Basic Metabolic Panel: Recent Labs  Lab 11/03/17 1918 11/04/17 0750  NA 138 138  K 3.3* 4.0  CL 100* 104  CO2 24 25  GLUCOSE 143* 113*  BUN 16 12  CREATININE 0.95 1.07*  CALCIUM 9.6 8.8*   CBC: Recent Labs  Lab 11/03/17 1918  WBC 13.3*  NEUTROABS 12.3*  HGB 13.7  HCT 43.6  MCV 85.9  PLT 173   Cardiac Enzymes: Recent Labs  Lab 11/03/17 1918 11/03/17 2309 11/04/17 0750 11/04/17 1204  CKTOTAL 774*  --  1,603*  --   CKMB  --  27.1*  --   --   TROPONINI 0.30*  --  0.99* 0.99*     Studies: Dg Elbow Complete Left  Result Date: 11/03/2017 CLINICAL DATA:  Fall. EXAM: LEFT ELBOW - COMPLETE 3+ VIEW COMPARISON:  None. FINDINGS: There is no evidence  of fracture, dislocation, or joint effusion. There is no evidence of arthropathy or other focal bone abnormality. Soft tissues are unremarkable. IMPRESSION: Negative. Electronically Signed   By: Obie Dredge M.D.   On: 11/03/2017 19:33   Dg Wrist Complete Left  Result Date: 11/03/2017 CLINICAL DATA:  Fall.  Unable to move wrist. EXAM: LEFT WRIST - COMPLETE 3+ VIEW COMPARISON:  None. FINDINGS: There is no evidence of fracture or dislocation. Moderate degenerative changes of first CMC joint. Osteopenia. Soft tissues are unremarkable. IMPRESSION: Negative. Electronically Signed   By: Obie Dredge M.D.   On: 11/03/2017 19:36   Ct Head Wo Contrast  Result Date: 11/03/2017 CLINICAL DATA:  83 year old female with  unwitnessed fall. EXAM: CT HEAD WITHOUT CONTRAST TECHNIQUE: Contiguous axial images were obtained from the base of the skull through the vertex without intravenous contrast. COMPARISON:  Head CT dated 01/07/2017 FINDINGS: Brain: There is moderate age-related atrophy and chronic microvascular ischemic changes. 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 Small focus of low-attenuation in the left cerebellar hemisphere, likely an old lacunar infarct. There is no acute intracranial hemorrhage. No mass effect or midline shift noted. No extra-axial fluid collection. Vascular: No hyperdense vessel or unexpected calcification. Skull: Normal. Negative for fracture or focal lesion. Sinuses/Orbits: There is mucoperiosteal thickening and partial opacification of the right sphenoid sinus. Remainder of the visualized paranasal sinuses and the mastoid air cells are clear. Other: None IMPRESSION: 1. No acute intracranial hemorrhage. 2. Age-related atrophy chronic microvascular ischemic changes. 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 Electronically Signed   By: Elgie Collard M.D.   On: 11/03/2017 20:04   Dg Shoulder Left  Result Date: 11/03/2017 CLINICAL DATA:  Postreduction for shoulder dislocation. EXAM: LEFT SHOULDER - 2+ VIEW COMPARISON:  Earlier same day FINDINGS: Three views study shows interval reduction of the humeral head dislocation. Hill-Sachs deformity evident. Tiny fragments in the acromiohumeral space suggests age indeterminate cortical fracture fragments. IMPRESSION: 1. Interval reduction of the left shoulder dislocation with Hill-Sachs deformity evident. 2. Possible intra-articular loose bodies/small fracture fragments. Electronically Signed   By: Kennith Center M.D.   On: 11/03/2017 20:23   Dg Shoulder Left  Result Date: 11/03/2017 CLINICAL DATA:  Fall. EXAM: LEFT SHOULDER - 2+ VIEW COMPARISON:  None. FINDINGS: Acute anterior dislocation of the humeral  head with respect to the glenoid. Probable Hill-Sachs deformity. Mild degenerative changes of the acromioclavicular joint. IMPRESSION: Acute anterior shoulder dislocation with probable Hill-Sachs deformity. Electronically Signed   By: Obie Dredge M.D.   On: 11/03/2017 19:34    Scheduled Meds: . clonazePAM  0.25 mg Oral BID  . docusate sodium  100 mg Oral BID  . enoxaparin (LOVENOX) injection  1 mg/kg Subcutaneous Q12H  . nitroGLYCERIN  0.5 inch Topical Q6H   Continuous Infusions: . sodium chloride 75 mL/hr at 11/04/17 1210    Assessment/Plan:  1. Acute rhabdomyolysis from fall and being on the floor for a long period of time.  IV fluid hydration.  I do not think this patient had an MI.  Check CPK tomorrow morning.  Discontinue nitroglycerin patch. 2. Left wrist drop after shoulder dislocation.  Unclear if this happened while she was crawling on the ground or not.  Case discussed with orthopedic surgery to evaluate the patient.  Could be a brachial plexus injury or neuropraxia.  On last x-ray shoulder looks like it has been reduced.  Potentially will need time for this to improve.  PT and OT  consultations place.  May end up needing an EMG as outpatient.  Will get neurology consultation while here. 3. Hypokalemia.  Replaced and IV fluids.  Change IV fluids to normal saline. 4. Essential hypertension.  Start metoprolol low dose.  Discontinue Nitropatch 5. Anxiety on Klonopin  Code Status:     Code Status Orders  (From admission, onward)        Start     Ordered   11/03/17 2236  Full code  Continuous     11/03/17 2235    Code Status History    Date Active Date Inactive Code Status Order ID Comments User Context   This patient has a current code status but no historical code status.    Advance Directive Documentation     Most Recent Value  Type of Advance Directive  Living will  Pre-existing out of facility DNR order (yellow form or pink MOST form)  No data  "MOST" Form in  Place?  No data     Family Communication: Son at the bedside Disposition Plan: Once PT and OT evaluate the patient potentially can be dispositioned.  Would also like to see CPK trended better.  Consultants:  Orthopedic surgery  Time spent: 35 minutes in coordination of care speaking with nursing staff and orthopedic surgery and family  Alford HighlandWIETING, Rodolph Hagemann  [employer]

## 2017-11-04 NOTE — Progress Notes (Signed)
Patient resting in the bed at this time, rounded with MD at bedside, POA updated about POC at this time, denies  any pain, vss, mood calm,

## 2017-11-04 NOTE — Care Management Note (Signed)
Case Management Note  Patient Details  Name: Molly Parks MRN: [id number] Date of Birth: 05/18/1928  Subjective/Objective:                 Patient lives at home with her son [name].  Patient informed CM I fell and I could not get up.  "I am not going to a nursing home so forget it."  Spoke with patient's son [name].  He had left the house around 10 and returned around 3:30 and patient ws calling out for help.  Up until now- there have been no concerns regarding patient being left alone during the day.  Has access to a walker.  Patient's arm is now in a sling due to shoulder dislocation.   Discussed home health services with [name] in in agreement.  Current with PCP and confirmed contact information.  PT and OT consult pending. No agency preference for home health  Action/Plan:  [facility]  Provided Med Alert brochure Contacted Expected Discharge Date:                  Expected Discharge Plan:     In-House Referral:     Discharge planning Services     Post Acute Care Choice:    Choice offered to:     DME Arranged:    DME Agency:     HH Arranged:    HH Agency:     Status of Service:     If discussed at [employer] Length of Stay Meetings, dates discussed:    Additional Comments:  Eber HongGreene, Sarahmarie Leavey R, RN 11/04/2017, 1:26 PM

## 2017-11-04 NOTE — Consult Note (Signed)
ORTHOPAEDIC CONSULTATION  PATIENT NAME: Molly Parks DOB: 09/09/1928  MRN: [id number]  REQUESTING PHYSICIAN: Shaune Pollackhen, Qing, MD  Chief Complaint:  Left "drop wrist"   HPI: Molly Perchearl V Strub is a 83 y.o. female who complains of the inability to actively extend the left wrist and fingers. The patient fell at home yesterday and sustained an anterior dislocation of the left shoulder. She was down for approximately 4 hours before her son came home and found her. She had apparently attempted to pull herself up from the floor unsuccessfully. Upon presentation to the ER she complained of left shoulder and elbow pain but also inability to extend the wrist due to "weakness". She denied any previous problems with the left hand or wrist. She does report some decreased sensation to the left little finger.  After closed reduction of the shoulder dislocation she was placed in a shoulder immobilizer and wrist splint.   Past Medical History:  Diagnosis Date  . COPD (chronic obstructive pulmonary disease) (HCC)   . Hypertension    Past Surgical History:  Procedure Laterality Date  . OOPHORECTOMY Right    Social History   Socioeconomic History  . Marital status: Widowed    Spouse name: None  . Number of children: None  . Years of education: None  . Highest education level: None  Social Needs  . Financial resource strain: None  . Food insecurity - worry: None  . Food insecurity - inability: None  . Transportation needs - medical: None  . Transportation needs - non-medical: None  Occupational History  . None  Tobacco Use  . Smoking status: Never Smoker  . Smokeless tobacco: Never Used  Substance and Sexual Activity  . Alcohol use: No  . Drug use: None  . Sexual activity: None  Other Topics Concern  . None  Social History Narrative  . None   No family history on file. Allergies  Allergen Reactions  . Ciprofloxacin   . Penicillins    Prior to Admission medications   Medication Sig Start  Date End Date Taking? Authorizing Provider  aspirin 325 MG EC tablet Take 325 mg daily by mouth.   Yes [provider]  clonazePAM (KLONOPIN) 0.5 MG tablet Take 0.25 mg 2 (two) times daily as needed by mouth.  09/10/17  Yes [provider]  ciprofloxacin (CIPRO) 250 MG tablet Take 1 tablet (250 mg total) by mouth 2 (two) times daily. Patient not taking: Reported on 11/03/2017 08/01/17   Irean HongSung, Jade J, MD  docusate sodium (COLACE) 100 MG capsule Take 1 capsule (100 mg total) by mouth 2 (two) times daily. Patient not taking: Reported on 11/03/2017 08/01/17   Irean HongSung, Jade J, MD  meclizine (ANTIVERT) 25 MG tablet Take 1 tablet (25 mg total) by mouth 3 (three) times daily as needed for dizziness. Patient not taking: Reported on 11/03/2017 01/07/17   Rockne MenghiniNorman, Anne-Caroline, MD  ondansetron (ZOFRAN ODT) 4 MG disintegrating tablet Take 1 tablet (4 mg total) by mouth every 8 (eight) hours as needed for nausea or vomiting. Patient not taking: Reported on 11/03/2017 08/01/17   Irean HongSung, Jade J, MD   Dg Elbow Complete Left  Result Date: 11/03/2017 CLINICAL DATA:  Fall. EXAM: LEFT ELBOW - COMPLETE 3+ VIEW COMPARISON:  None. FINDINGS: There is no evidence of fracture, dislocation, or joint effusion. There is no evidence of arthropathy or other focal bone abnormality. Soft tissues are unremarkable. IMPRESSION: Negative. Electronically Signed   By: Obie DredgeWilliam T Derry M.D.   On: 11/03/2017 19:33  Dg Wrist Complete Left  Result Date: 11/03/2017 CLINICAL DATA:  Fall.  Unable to move wrist. EXAM: LEFT WRIST - COMPLETE 3+ VIEW COMPARISON:  None. FINDINGS: There is no evidence of fracture or dislocation. Moderate degenerative changes of first CMC joint. Osteopenia. Soft tissues are unremarkable. IMPRESSION: Negative. Electronically Signed   By: Obie DredgeWilliam T Derry M.D.   On: 11/03/2017 19:36   Ct Head Wo Contrast  Result Date: 11/03/2017 CLINICAL DATA:  83 year old female with unwitnessed fall. EXAM: CT HEAD  WITHOUT CONTRAST TECHNIQUE: Contiguous axial images were obtained from the base of the skull through the vertex without intravenous contrast. COMPARISON:  Head CT dated 01/07/2017 FINDINGS: Brain: There is moderate age-related atrophy and chronic microvascular ischemic changes. 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 Small focus of low-attenuation in the left cerebellar hemisphere, likely an old lacunar infarct. There is no acute intracranial hemorrhage. No mass effect or midline shift noted. No extra-axial fluid collection. Vascular: No hyperdense vessel or unexpected calcification. Skull: Normal. Negative for fracture or focal lesion. Sinuses/Orbits: There is mucoperiosteal thickening and partial opacification of the right sphenoid sinus. Remainder of the visualized paranasal sinuses and the mastoid air cells are clear. Other: None IMPRESSION: 1. No acute intracranial hemorrhage. 2. Age-related atrophy chronic microvascular ischemic changes. 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 Electronically Signed   By: Elgie CollardArash  Radparvar M.D.   On: 11/03/2017 20:04   Dg Shoulder Left  Result Date: 11/03/2017 CLINICAL DATA:  Postreduction for shoulder dislocation. EXAM: LEFT SHOULDER - 2+ VIEW COMPARISON:  Earlier same day FINDINGS: Three views study shows interval reduction of the humeral head dislocation. Hill-Sachs deformity evident. Tiny fragments in the acromiohumeral space suggests age indeterminate cortical fracture fragments. IMPRESSION: 1. Interval reduction of the left shoulder dislocation with Hill-Sachs deformity evident. 2. Possible intra-articular loose bodies/small fracture fragments. Electronically Signed   By: Kennith CenterEric  Mansell M.D.   On: 11/03/2017 20:23   Dg Shoulder Left  Result Date: 11/03/2017 CLINICAL DATA:  Fall. EXAM: LEFT SHOULDER - 2+ VIEW COMPARISON:  None. FINDINGS: Acute anterior dislocation of the humeral head with respect to the  glenoid. Probable Hill-Sachs deformity. Mild degenerative changes of the acromioclavicular joint. IMPRESSION: Acute anterior shoulder dislocation with probable Hill-Sachs deformity. Electronically Signed   By: Obie DredgeWilliam T Derry M.D.   On: 11/03/2017 19:34    Positive ROS: All other systems have been reviewed and were otherwise negative with the exception of those mentioned in the HPI and as above.  Physical Exam: General: Well developed, well nourished female seen in no acute distress. Neck: Supple, nontender, good range of motion. Spurling's test and Lhermitte's sign are negative. Skin: No lesions, ecchymosis to the shoulder, elbow, or wist. Neurologic: Awake, alert, and oriented. Sensory function is grossly intact with the exception of slight decrease to the left little finger. No appreciable numbness to the dorsal webspace between the left thumb and index finger or to the lateral aspect of the left shoulder. Motor strength is felt to be 5 over 5 with the exception of complete loss of active extension to the left wrist and left index, long, ring, and little fingers. Active left thumb extension is present but diminished. No clonus or tremor. Good motor coordination.  MUSCULOSKELETAL: Examination of the left upper extremity was performed. There was mild tenderness to palpation along the shoulder without significant swelling. Shoulder range of motion was not assessed due to the injury. No significant tenderness to palpation along the upper arm or elbow.  Tinel test at the elbow was negative. Good passive range of motion to the left wrist and digits. Fair to good grip strength and active wrist flexion. No tenderness to the hand or wrist. Good capillary refill.  Assessment: Left radial nerve palsy S/p anterior dislocation of the left shoulder  Plan: Continue with the shoulder immobilizer. Consult OT for hand therapy and fabrication of a wrist splint to accommodate the radial nerve palsy. Consider  Neurology consult. The palsy/neuropraxia may be related to a traction injury associated with the shoulder dislocation, although the axillary nerve is more commonly seen with such injury.  Hridaan Bouse P. Angie Fava M.D.

## 2017-11-04 NOTE — Progress Notes (Signed)
PT Cancellation Note  Patient Details Name: Molly Parks MRN: [id number] DOB: 02/16/1928   Cancelled Treatment:    Reason Eval/Treat Not Completed: Other (comment). Consult received and chart reviewed. Pt with fall at home with L shoulder dislocation (now in sling) with pending ortho consult. Pt also with + NSTEMI per ED notes with up trending troponin's along with pending cardio consult. Will hold therapy at this time until medically cleared.   [name] 11/04/2017, 2:09 PM  Elizabeth PalauStephanie Meshulem Onorato, PT, DPT [phone]

## 2017-11-04 NOTE — Consult Note (Signed)
[facility]  CARDIOLOGY CONSULT NOTE  Patient ID: Molly Parks MRN: [id number] DOB/AGE: 81/03/1928 83 y.o.  Admit date: 11/03/2017 Referring Physician Dr. Imogene BurnHen Primary Physician Dr. Zada Finderslmedo Primary Cardiologist Dr. Lady GaryFath Reason for Consultation fall with elevated troponin  HPI: Pt is an 83 yo female with history of hpertension who states she suffered a mechanical fall and was not able to get up for 4-5 hours. She was crawling on the floor trying to get to a telephone. Her family found her and brought her to the er. INitial troponin was 0.30 and increased to 0.99. Marland Kitchen. She was relatively hypokalemic with serum K of 3.3 and creatinine of 0.95. CK and MB were also mildly elevated suggestive of rhabdomyaolysis. She denied any chest pain prior to admission or subsequntly. She is somewhat a difficult historian. Head ct revaled atrophy with no acute cva. EKG showed nsr with no ischemia. SHe has had no arrhythmia on telemetry. She complains of left shoulder and arm pain after her fall   Review of Systems  Constitutional: Negative.   HENT: Negative.   Eyes: Negative.   Respiratory: Positive for shortness of breath.   Cardiovascular: Negative.   Gastrointestinal: Negative.   Genitourinary: Negative.   Musculoskeletal: Positive for falls, joint pain and myalgias.  Skin: Negative.   Neurological: Negative.   Endo/Heme/Allergies: Negative.   Psychiatric/Behavioral: Negative.     Past Medical History:  Diagnosis Date  . COPD (chronic obstructive pulmonary disease) (HCC)   . Hypertension     No family history on file.  Social History   Socioeconomic History  . Marital status: Widowed    Spouse name: Not on file  . Number of children: Not on file  . Years of education: Not on file  . Highest education level: Not on file  Social Needs  . Financial resource strain: Not on file  . Food insecurity - worry: Not on file  . Food insecurity -  inability: Not on file  . Transportation needs - medical: Not on file  . Transportation needs - non-medical: Not on file  Occupational History  . Not on file  Tobacco Use  . Smoking status: Never Smoker  . Smokeless tobacco: Never Used  Substance and Sexual Activity  . Alcohol use: No  . Drug use: Not on file  . Sexual activity: Not on file  Other Topics Concern  . Not on file  Social History Narrative  . Not on file    Past Surgical History:  Procedure Laterality Date  . OOPHORECTOMY Right      Medications Prior to Admission  Medication Sig Dispense Refill Last Dose  . aspirin 325 MG EC tablet Take 325 mg daily by mouth.   Unknown at Unknown  . clonazePAM (KLONOPIN) 0.5 MG tablet Take 0.25 mg 2 (two) times daily as needed by mouth.   0 prn at prn  . ciprofloxacin (CIPRO) 250 MG tablet Take 1 tablet (250 mg total) by mouth 2 (two) times daily. (Patient not taking: Reported on 11/03/2017) 14 tablet 0 Completed Course at Unknown time  . docusate sodium (COLACE) 100 MG capsule Take 1 capsule (100 mg total) by mouth 2 (two) times daily. (Patient not taking: Reported on 11/03/2017) 10 capsule 0 Not Taking  . meclizine (ANTIVERT) 25 MG tablet Take 1 tablet (25 mg total) by mouth 3 (three) times daily as needed for dizziness. (Patient not taking: Reported on 11/03/2017) 12 tablet 0 Not Taking  .  ondansetron (ZOFRAN ODT) 4 MG disintegrating tablet Take 1 tablet (4 mg total) by mouth every 8 (eight) hours as needed for nausea or vomiting. (Patient not taking: Reported on 11/03/2017) 20 tablet 0 Not Taking    Physical Exam: Blood pressure (!) 150/79, pulse 96, temperature 98 F (36.7 C), temperature source Oral, resp. rate 18, height 5\' 7"  (1.702 m), weight 63.8 kg (140 lb 9.6 oz), SpO2 100 %.   Wt Readings from Last 1 Encounters:  11/04/17 63.8 kg (140 lb 9.6 oz)     General appearance: cooperative and slowed mentation Resp: clear to auscultation bilaterally Chest wall: left sided  chest wall tenderness Cardio: regular rate and rhythm Extremities: left arm and shoulder trauma Neurologic: Grossly normal  Labs:   Lab Results  Component Value Date   WBC 13.3 (H) 11/03/2017   HGB 13.7 11/03/2017   HCT 43.6 11/03/2017   MCV 85.9 11/03/2017   PLT 173 11/03/2017    Recent Labs  Lab 11/04/17 0750  NA 138  K 4.0  CL 104  CO2 25  BUN 12  CREATININE 1.07*  CALCIUM 8.8*  GLUCOSE 113*   Lab Results  Component Value Date   CKTOTAL 1,603 (H) 11/04/2017   CKMB 27.1 (H) 11/03/2017   TROPONINI 0.99 (HH) 11/04/2017       EKG: nsr  ASSESSMENT AND PLAN:  Pt with history of hypertension who was admitted after sufferijng a mechanical fall resulting in a prolonged period on the floor. She was noted to have probable mild rhabdo with mild elevated serum troponin and ck/mb. Renal function was fairly normal. Troponin does not appear to represent an acute coronary event. Echo is pending. Would continue with gentle hydration. WIll review echo when avaialb.e  Signed: Dalia HeadingKenneth A Filiberto Wamble MD, [facility] 11/04/2017, 7:22 PM

## 2017-11-04 NOTE — Progress Notes (Signed)
OT Cancellation Note  Patient Details Name: Molly Parks MRN: [id number] DOB: 03/21/1928   Cancelled Treatment:    Reason Eval/Treat Not Completed: Medical issues which prohibited therapy. Order received and chart reviewed. Pt with fall at home with L shoulder dislocation (now in sling), L wrist drop, with pending ortho consult. Pt also with + NSTEMI per ED notes with up trending troponin's along with pending cardio consult. Will hold therapy at this time until medically cleared.  Richrd PrimeJamie Stiller, MPH, MS, OTR/L ascom [phone] 11/04/17, 2:20 PM

## 2017-11-05 DIAGNOSIS — M21332 Wrist drop, left wrist: Secondary | ICD-10-CM

## 2017-11-05 LAB — CBC
HCT: 36.5 % (ref 35.0–47.0)
HEMOGLOBIN: 11.9 g/dL — AB (ref 12.0–16.0)
MCH: 27.5 pg (ref 26.0–34.0)
MCHC: 32.5 g/dL (ref 32.0–36.0)
MCV: 84.6 fL (ref 80.0–100.0)
PLATELETS: 148 10*3/uL — AB (ref 150–440)
RBC: 4.31 MIL/uL (ref 3.80–5.20)
RDW: 14.7 % — ABNORMAL HIGH (ref 11.5–14.5)
WBC: 9.5 10*3/uL (ref 3.6–11.0)

## 2017-11-05 LAB — CK: CK TOTAL: 843 U/L — AB (ref 38–234)

## 2017-11-05 MED ORDER — METOPROLOL TARTRATE 25 MG PO TABS
12.5000 mg | ORAL_TABLET | Freq: Two times a day (BID) | ORAL | 0 refills | Status: DC
Start: 2017-11-05 — End: 2024-08-03

## 2017-11-05 MED ORDER — HYDROCODONE-ACETAMINOPHEN 5-325 MG PO TABS: 1.0000 | ORAL_TABLET | Freq: Four times a day (QID) | ORAL | 0 refills | Status: DC | PRN

## 2017-11-05 MED ORDER — BISACODYL 5 MG PO TBEC
5.0000 mg | DELAYED_RELEASE_TABLET | Freq: Every day | ORAL | Status: DC | PRN
  Administered 2017-11-05: 5 mg via ORAL
  Filled 2017-11-05: qty 1

## 2017-11-05 MED ORDER — BISACODYL 5 MG PO TBEC: 5.0000 mg | DELAYED_RELEASE_TABLET | Freq: Every day | ORAL | 0 refills | Status: DC | PRN

## 2017-11-05 NOTE — Evaluation (Signed)
Occupational Therapy Evaluation Patient Details Name: Molly Parks MRN: [id number] DOB: 1928/05/04 Today's Date: 11/05/2017    History of Present Illness Pt admitted for rhabdomyolosis. Pt with fall with caused L shoulder dislocation, reduced in ER. Pt now with complaints of L wrist drop and currently in immobilizer. Pt with increased troponins, however no acute MI per cardio. Ortho consult completed and cleared for therapy with diagnosis of radial nerve palsy. HIstory includes COPD and HTN. 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    Clinical Impression    Patient was evaluated this date by OT, she lives at home with her son. Patient was independent prior to admission and is a retired Engineer, civil (consulting). She stated that she has a long walking stick for ambulation but would use a RW for longer distances. She has a wrist cock up splint in place but no sling on during session.  She states that pain is improved but is uncomfortable mainly when having to sit up or stand.  Forward flexion is limited for ADLs and currently requires mod assist.  She presents with intact sensation in L hand with wrist drop and has minimal flexion and extension in digits 2-4 and no movement or sensation in 5th digit.  She has good AROM in L thumb.  Pt educated in ways to modify splint to help support fingers using a toilet paper roll with gauze and a foam utensil holder.  Rec she move L hand as much as possible and use R hand to passively move fingers in full extension which she demonstrated back to therapist.  If her wrist drop continues, rec a splint be fabricated at [facility] with Oletta Cohn.  This facility does not have the equipment or supplies to fabricate a custom splint.  Patient presents with decreased strength, decreased ability to perform self care tasks and will need further instructions for managing daily self care tasks with use of one hand only. Patient will benefit from skilled OT care to address these limitations  and improve independence in daily tasks to return home with her son.Rec SNF to improve function and use of LUE and hand to prevent falls and then progress to [facility] services.  She has a friend Britta Mccreedy who can help during the day and her son can supervise her at night (he works during the day). She has a BSC, grab bars over toilet and a transfer tub bench in hallway bathroom.    Follow Up Recommendations  SNF    Equipment Recommendations       Recommendations for Other Services       Precautions / Restrictions Precautions Precautions: Fall Required Braces or Orthoses: Other Brace/Splint Other Brace/Splint: shoulder immobilizer-worn at all times and L wrist cock up splint Restrictions Weight Bearing Restrictions: No      Mobility Bed Mobility Overal bed mobility: Needs Assistance Bed Mobility: Supine to Sit     Supine to sit: Mod assist     General bed mobility comments: needs assist for trunkal elevation and scooting out towards EOB. Once seated at EOB, slight dizziness noted.   Transfers Overall transfer level: Needs assistance Equipment used: (IV pole) Transfers: Sit to/from Stand Sit to Stand: Mod assist         General transfer comment: Pt cued for pushing from seated surface. Needs assist for rising to standing secondary to B LE weakness. Once standing, able to decreased assist to min assist and pt able to hold onto IV pole    Balance Overall  balance assessment: History of Falls;Needs assistance Sitting-balance support: Feet supported;Single extremity supported Sitting balance-Leahy Scale: Good     Standing balance support: Single extremity supported Standing balance-Leahy Scale: Fair                             ADL either performed or assessed with clinical judgement   ADL Overall ADL's : Needs assistance/impaired Eating/Feeding: Set up;Minimal assistance Eating/Feeding Details (indicate cue type and reason): only able to use R hand for opening  packages and is R handed Grooming: Oral care;Applying deodorant;Set up;Brushing hair;Minimal assistance Grooming Details (indicate cue type and reason): Using R hand only. Upper Body Bathing: Set up;Moderate assistance Upper Body Bathing Details (indicate cue type and reason): pain in LUE limiting ability to move LUE with no current WB restrictions from MD and was reduced in ER after dislocated and has immobilizer and wrist cock up splint LUE and wrist      Upper Body Dressing : Maximal assistance;Set up   Lower Body Dressing: Set up;Moderate assistance               Functional mobility during ADLs: Minimal assistance;Cueing for safety General ADL Comments: Mod assist for sit to stand but then min assist to supervision after she is standing to go from chair to bed per NSG report.  Pt was not able to tolerate sitting up for an hour and was in bed already since PT session.  Movement OOB is painful and needs assist for L side to push up.      Vision Baseline Vision/History: Wears glasses Wears Glasses: At all times Patient Visual Report: No change from baseline       Perception     Praxis      Pertinent Vitals/Pain Pain Assessment: Faces Faces Pain Scale: Hurts a little bit Pain Location: L shoulder with movement Pain Descriptors / Indicators: Discomfort;Dull Pain Intervention(s): Limited activity within patient's tolerance;Repositioned;Monitored during session;Premedicated before session     Hand Dominance Right   Extremity/Trunk Assessment Upper Extremity Assessment Upper Extremity Assessment: Generalized weakness   Lower Extremity Assessment Lower Extremity Assessment: Defer to PT evaluation       Communication Communication Communication: No difficulties   Cognition Arousal/Alertness: Awake/alert Behavior During Therapy: WFL for tasks assessed/performed;Anxious Overall Cognitive Status: Within Functional Limits for tasks assessed                                      General Comments  sat at EOB approx 3-4 min, able to perform LAQ and marches while dizziness subsided    Exercises     Shoulder Instructions      Home Living Family/patient expects to be discharged to:: Private residence Living Arrangements: Children Available Help at Discharge: Family;Available PRN/intermittently Type of Home: House Home Access: Stairs to enter Entergy CorporationEntrance Stairs-Number of Steps: 3 Entrance Stairs-Rails: Right Home Layout: One level     Bathroom Shower/Tub: Producer, television/film/videoWalk-in shower   Bathroom Toilet: Handicapped height Bathroom Accessibility: Yes How Accessible: Accessible via walker Home Equipment: Walker - 2 wheels;Walker - standard;Bedside commode;Grab bars - toilet;Tub bench   Additional Comments: Pt has a walk in shower in bedroom with built in seat and a tub with shower in hallway with a transfer tub bench.  She has BSC and grab bars on each side of toilet in her bathroom by bedroom.  Prior Functioning/Environment Level of Independence: Independent        Comments: previously independent, occasionally used RW for long distances        OT Problem List: Decreased strength;Decreased range of motion;Decreased activity tolerance;Impaired UE functional use;Pain      OT Treatment/Interventions: Self-care/ADL training;DME and/or AE instruction;Patient/family education    OT Goals(Current goals can be found in the care plan section) Acute Rehab OT Goals Patient Stated Goal: "to do more for myself again" OT Goal Formulation: With patient Time For Goal Achievement: 11/19/17 Potential to Achieve Goals: Good ADL Goals Pt Will Perform Grooming: with set-up;with modified independence Pt Will Perform Upper Body Dressing: with mod assist;with set-up;sitting;with caregiver independent in assisting Pt Will Perform Lower Body Dressing: with set-up;with min assist;sit to/from stand Pt Will Transfer to Toilet: with supervision;with min assist;stand  pivot transfer;grab bars Pt/caregiver will Perform Home Exercise Program: Increased strength;Left upper extremity;Increased ROM;With written HEP provided;With Supervision  OT Frequency: Min 1X/week   Barriers to D/C:            Co-evaluation              AM-PAC PT "6 Clicks" Daily Activity     Outcome Measure Help from another person eating meals?: A Little Help from another person taking care of personal grooming?: A Little Help from another person toileting, which includes using toliet, bedpan, or urinal?: A Lot Help from another person bathing (including washing, rinsing, drying)?: A Lot Help from another person to put on and taking off regular upper body clothing?: A Lot Help from another person to put on and taking off regular lower body clothing?: A Lot 6 Click Score: 14   End of Session Nurse Communication: Other (comment)(rec assessing skin at edges of splint and provided gauze along edges)  Activity Tolerance: Patient limited by pain Patient left: in bed;with call bell/phone within reach;with bed alarm set  OT Visit Diagnosis: History of falling (Z91.81);Muscle weakness (generalized) (M62.81);Other (comment)(Decreased use of LUE and hand from shoulder dislocation and wrist drop)                Time: [id number] OT Time Calculation (min): 40 min Charges:  OT General Charges $OT Visit: 1 Visit OT Evaluation $OT Eval Moderate Complexity: 1 Mod OT Treatments $Self Care/Home Management : 8-22 mins $Therapeutic Activity: 8-22 mins G-Codes: OT G-codes **NOT FOR INPATIENT CLASS** Functional Limitation: Self care Self Care Current Status (H8469(G8987): At least 60 percent but less than 80 percent impaired, limited or restricted Self Care Goal Status (G2952(G8988): At least 40 percent but less than 60 percent impaired, limited or restricted   Susanne BordersSusan Adeena Bernabe, OTR/L ascom [phone] 11/05/17, 12:05 PM

## 2017-11-05 NOTE — Discharge Summary (Signed)
[facility] [place] [facility]   PATIENT NAME: Molly Parks    MR#:  [id number]  DATE OF BIRTH:  04/23/1928  DATE OF ADMISSION:  11/03/2017   ADMITTING PHYSICIAN: Arnaldo NatalMichael S Diamond, MD  DATE OF DISCHARGE: 11/05/2017 PRIMARY CARE PHYSICIAN: Dione Housekeeperlmedo, Mario Ernesto, MD   ADMISSION DIAGNOSIS:  Left wrist drop [M21.332] Traumatic rhabdomyolysis, initial encounter (HCC) [T79.6XXA] Traumatic closed displaced fracture of left shoulder with anterior dislocation, initial encounter [J19[S42.92XA] Cardiac arrhythmia, unspecified cardiac arrhythmia type [I49.9] DISCHARGE DIAGNOSIS:  Active Problems:   Rhabdomyolysis  SECONDARY DIAGNOSIS:   Past Medical History:  Diagnosis Date  . COPD (chronic obstructive pulmonary disease) (HCC)   . Hypertension    HOSPITAL COURSE:   1. Acute rhabdomyolysis from fall and being on the floor for a long period of time.  Improving with IV fluid hydration. \  Elevated troponin, possible due to above. Continue aspirin and lopressor, follow up echo and Dr. Lady GaryFath as outpatient.  2. Left wrist drop after shoulder dislocation. Could be a brachial plexus injury or neuropraxia.  continue PT and OT, pain control. If no improvement in symptoms patient to have a NCV/EMG on an outpatient basis and MRI of the brachial plexus which will need to be performed outpatient in an open MR machine per Dr. Thad Rangereynolds. Follow up Dr. Ernest PineHooten as outpatient.  3. Hypokalemia.    Improved with supplement. 4. Essential hypertension.  Start metoprolol low dose.  Discontinued Nitropatch. 5. Anxiety on Klonopin DISCHARGE CONDITIONS:  Stable, discharge to home with home health PT, OT, RN, NA, SW today. CONSULTS OBTAINED:  Treatment Team:  Dalia HeadingFath, Kenneth A, MD Thana Farreynolds, Leslie, MD Ernest PineHooten, Illene LabradorJames P, MD DRUG ALLERGIES:   Allergies  Allergen Reactions  . Ciprofloxacin   . Penicillins    DISCHARGE MEDICATIONS:   Allergies as of 11/05/2017      Reactions   Ciprofloxacin    Penicillins       Medication List    STOP taking these medications   ciprofloxacin 250 MG tablet Commonly known as:  CIPRO     TAKE these medications   aspirin 325 MG EC tablet Take 325 mg daily by mouth.   bisacodyl 5 MG EC tablet Commonly known as:  DULCOLAX Take 1 tablet (5 mg total) daily as needed by mouth for moderate constipation.   clonazePAM 0.5 MG tablet Commonly known as:  KLONOPIN Take 0.25 mg 2 (two) times daily as needed by mouth.   docusate sodium 100 MG capsule Commonly known as:  COLACE Take 1 capsule (100 mg total) by mouth 2 (two) times daily.   HYDROcodone-acetaminophen 5-325 MG tablet Commonly known as:  NORCO/VICODIN Take 1 tablet every 6 (six) hours as needed by mouth for moderate pain or severe pain.   meclizine 25 MG tablet Commonly known as:  ANTIVERT Take 1 tablet (25 mg total) by mouth 3 (three) times daily as needed for dizziness.   metoprolol tartrate 25 MG tablet Commonly known as:  LOPRESSOR Take 0.5 tablets (12.5 mg total) 2 (two) times daily by mouth.   ondansetron 4 MG disintegrating tablet Commonly known as:  ZOFRAN ODT Take 1 tablet (4 mg total) by mouth every 8 (eight) hours as needed for nausea or vomiting.        DISCHARGE INSTRUCTIONS:  See AVS. If you experience worsening of your admission symptoms, develop shortness of breath, life threatening emergency, suicidal or homicidal thoughts you must seek medical attention immediately by calling 911 or calling your MD  immediately  if symptoms less severe.  You Must read complete instructions/literature along with all the possible adverse reactions/side effects for all the Medicines you take and that have been prescribed to you. Take any new Medicines after you have completely understood and accpet all the possible adverse reactions/side effects.   Please note  You were cared for by a hospitalist during your hospital stay. If you have any questions about your  discharge medications or the care you received while you were in the hospital after you are discharged, you can call the unit and asked to speak with the hospitalist on call if the hospitalist that took care of you is not available. Once you are discharged, your primary care physician will handle any further medical issues. Please note that NO REFILLS for any discharge medications will be authorized once you are discharged, as it is imperative that you return to your primary care physician (or establish a relationship with a primary care physician if you do not have one) for your aftercare needs so that they can reassess your need for medications and monitor your lab values.    On the day of Discharge:  VITAL SIGNS:  Blood pressure 135/64, pulse 74, temperature 98.4 F (36.9 C), temperature source Oral, resp. rate 18, height 5\' 7"  (1.702 m), weight 141 lb 9.6 oz (64.2 kg), SpO2 100 %. PHYSICAL EXAMINATION:  GENERAL:  83 y.o.-year-old patient lying in the bed with no acute distress.  EYES: Pupils equal, round, reactive to light and accommodation. No scleral icterus. Extraocular muscles intact.  HEENT: Head atraumatic, normocephalic. Oropharynx and nasopharynx clear.  NECK:  Supple, no jugular venous distention. No thyroid enlargement, no tenderness.  LUNGS: Normal breath sounds bilaterally, no wheezing, rales,rhonchi or crepitation. No use of accessory muscles of respiration.  CARDIOVASCULAR: S1, S2 normal. No murmurs, rubs, or gallops.  ABDOMEN: Soft, non-tender, non-distended. Bowel sounds present. No organomegaly or mass.  EXTREMITIES: No pedal edema, cyanosis, or clubbing. Left wrist drop.  Patient can extend her fingers on her left hand.  NEUROLOGIC: Cranial nerves II through XII are intact. Muscle strength 4/5 in all extremities. Sensation intact. Gait not checked.  PSYCHIATRIC: The patient is alert and oriented x 3.  SKIN: No obvious rash, lesion, or ulcer.  DATA REVIEW:   CBC Recent  Labs  Lab 11/05/17 0854  WBC 9.5  HGB 11.9*  HCT 36.5  PLT 148*    Chemistries  Recent Labs  Lab 11/04/17 0750  NA 138  K 4.0  CL 104  CO2 25  GLUCOSE 113*  BUN 12  CREATININE 1.07*  CALCIUM 8.8*     Microbiology Results  No results found for this or any previous visit.  RADIOLOGY:  No results found.   Management plans discussed with the patient, her son and they are in agreement.  CODE STATUS: Full Code   TOTAL TIME TAKING CARE OF THIS PATIENT: 42 minutes.    Shaune Pollackhen, Akela Pocius M.D on 11/05/2017 at 4:07 PM  Between 7am to 6pm - Pager - [phone]  After 6pm go to [email or web address] - Social research officer, governmentpassword [email or web address]  [facility]  Office  [phone]  CC: Primary care physician; Dione Housekeeperlmedo, Mario Ernesto, MD   Note: This dictation was prepared with Dragon dictation along with smaller phrase technology. Any transcriptional errors that result from this process are unintentional.

## 2017-11-05 NOTE — Progress Notes (Signed)
Molly Parks to be D/C'd Home per MD order.  Discussed prescriptions and follow up appointments with the patient. Prescriptions given to patient, medication list explained in detail. Pt verbalized understanding. Son Molly Parks at bedside  Allergies as of 11/05/2017      Reactions   Ciprofloxacin    Penicillins       Medication List    STOP taking these medications   ciprofloxacin 250 MG tablet Commonly known as:  CIPRO     TAKE these medications   aspirin 325 MG EC tablet Take 325 mg daily by mouth.   bisacodyl 5 MG EC tablet Commonly known as:  DULCOLAX Take 1 tablet (5 mg total) daily as needed by mouth for moderate constipation.   clonazePAM 0.5 MG tablet Commonly known as:  KLONOPIN Take 0.25 mg 2 (two) times daily as needed by mouth.   docusate sodium 100 MG capsule Commonly known as:  COLACE Take 1 capsule (100 mg total) by mouth 2 (two) times daily.   HYDROcodone-acetaminophen 5-325 MG tablet Commonly known as:  NORCO/VICODIN Take 1 tablet every 6 (six) hours as needed by mouth for moderate pain or severe pain.   meclizine 25 MG tablet Commonly known as:  ANTIVERT Take 1 tablet (25 mg total) by mouth 3 (three) times daily as needed for dizziness.   metoprolol tartrate 25 MG tablet Commonly known as:  LOPRESSOR Take 0.5 tablets (12.5 mg total) 2 (two) times daily by mouth.   ondansetron 4 MG disintegrating tablet Commonly known as:  ZOFRAN ODT Take 1 tablet (4 mg total) by mouth every 8 (eight) hours as needed for nausea or vomiting.       Vitals:   11/05/17 0327 11/05/17 0734  BP: 131/74 135/64  Pulse: 74 74  Resp: 18 18  Temp: 98.6 F (37 C) 98.4 F (36.9 C)  SpO2: 99% 100%    Skin clean, dry and intact without evidence of skin break down, no evidence of skin tears noted. IV catheter discontinued intact. Site without signs and symptoms of complications. Dressing and pressure applied. Tele box removed and returned.Pt denies pain at this time. No  complaints noted.  An After Visit Summary was printed and given to the patient. Patient escorted via WC, and D/C home via private auto.  Molly Parks

## 2017-11-05 NOTE — Clinical Social Work Note (Signed)
CSW received referral for SNF.  Case discussed with case manager and plan is to discharge home with home health.  CSW to sign off please re-consult if social work needs arise.  Cecil Bixby R. Cherylin Waguespack, MSW, LCSWA [phone]  

## 2017-11-05 NOTE — Consult Note (Signed)
Reason for Consult:Left wrist drop Referring Physician: Imogene Burnhen  CC: Left wrist drop  HPI: Molly Parks is an 83 y.o. female who reports a fall at her facility.  Reports that she was on the floor for 4 hours.  Presented with severe left shoulder pain.  Was found to have a left shoulder dislocation that has now been reduced.  Patient also reports left wrist drop.    Past Medical History:  Diagnosis Date  . COPD (chronic obstructive pulmonary disease) (HCC)   . Hypertension     Past Surgical History:  Procedure Laterality Date  . OOPHORECTOMY Right     Family history: Father with HTN.  Sister with cancer and DM  Social History:  reports that  has never smoked. she has never used smokeless tobacco. She reports that she does not drink alcohol. Her drug history is not on file.  Allergies  Allergen Reactions  . Ciprofloxacin   . Penicillins     Medications:  I have reviewed the patient's current medications. Prior to Admission:  Medications Prior to Admission  Medication Sig Dispense Refill Last Dose  . aspirin 325 MG EC tablet Take 325 mg daily by mouth.   Unknown at Unknown  . clonazePAM (KLONOPIN) 0.5 MG tablet Take 0.25 mg 2 (two) times daily as needed by mouth.   0 prn at prn  . ciprofloxacin (CIPRO) 250 MG tablet Take 1 tablet (250 mg total) by mouth 2 (two) times daily. (Patient not taking: Reported on 11/03/2017) 14 tablet 0 Completed Course at Unknown time  . docusate sodium (COLACE) 100 MG capsule Take 1 capsule (100 mg total) by mouth 2 (two) times daily. (Patient not taking: Reported on 11/03/2017) 10 capsule 0 Not Taking  . meclizine (ANTIVERT) 25 MG tablet Take 1 tablet (25 mg total) by mouth 3 (three) times daily as needed for dizziness. (Patient not taking: Reported on 11/03/2017) 12 tablet 0 Not Taking  . ondansetron (ZOFRAN ODT) 4 MG disintegrating tablet Take 1 tablet (4 mg total) by mouth every 8 (eight) hours as needed for nausea or vomiting. (Patient not taking:  Reported on 11/03/2017) 20 tablet 0 Not Taking   Scheduled: . aspirin EC  81 mg Oral Daily  . clonazePAM  0.25 mg Oral BID  . docusate sodium  100 mg Oral BID  . enoxaparin (LOVENOX) injection  1 mg/kg Subcutaneous Q12H  . metoprolol tartrate  12.5 mg Oral BID    ROS: History obtained from the patient  General ROS: negative for - chills, fatigue, fever, night sweats, weight gain or weight loss Psychological ROS: negative for - behavioral disorder, hallucinations, memory difficulties, mood swings or suicidal ideation Ophthalmic ROS: negative for - blurry vision, double vision, eye pain or loss of vision ENT ROS: negative for - epistaxis, nasal discharge, oral lesions, sore throat, tinnitus or vertigo Allergy and Immunology ROS: negative for - hives or itchy/watery eyes Hematological and Lymphatic ROS: negative for - bleeding problems, bruising or swollen lymph nodes Endocrine ROS: negative for - galactorrhea, hair pattern changes, polydipsia/polyuria or temperature intolerance Respiratory ROS: negative for - cough, hemoptysis, shortness of breath or wheezing Cardiovascular ROS: negative for - chest pain, dyspnea on exertion, edema or irregular heartbeat Gastrointestinal ROS: negative for - abdominal pain, diarrhea, hematemesis, nausea/vomiting or stool incontinence Genito-Urinary ROS: negative for - dysuria, hematuria, incontinence or urinary frequency/urgency Musculoskeletal ROS: left shoulder pain Neurological ROS: as noted in HPI Dermatological ROS: negative for rash and skin lesion changes  Physical Examination: Blood pressure  135/64, pulse 74, temperature 98.4 F (36.9 C), temperature source Oral, resp. rate 18, height 5\' 7"  (1.702 m), weight 64.2 kg (141 lb 9.6 oz), SpO2 100 %.  HEENT-  Normocephalic, no lesions, without obvious abnormality.  Normal external eye and conjunctiva.  Normal TM's bilaterally.  Normal auditory canals and external ears. Normal external nose, mucus  membranes and septum.  Normal pharynx. Cardiovascular- S1, S2 normal, pulses palpable throughout   Lungs- chest clear, no wheezing, rales, normal symmetric air entry Abdomen- soft, non-tender; bowel sounds normal; no masses,  no organomegaly Extremities- in sling Lymph-no adenopathy palpable Musculoskeletal-left shoulder tenderness Skin-warm and dry, no hyperpigmentation, vitiligo, or suspicious lesions  Neurological Examination   Mental Status: Alert, oriented, thought content appropriate.  Speech fluent without evidence of aphasia.  Able to follow 3 step commands without difficulty. Cranial Nerves: II: Discs flat bilaterally; Visual fields grossly normal, pupils equal, round, reactive to light and accommodation III,IV, VI: ptosis not present, extra-ocular motions intact bilaterally V,VII: smile symmetric, facial light touch sensation normal bilaterally VIII: hearing normal bilaterally IX,X: gag reflex present XI: bilateral shoulder shrug XII: midline tongue extension Motor: Due to pain patient does not cooperate fully with exam.  5/5 RUE.  Makes a 4/5 left hand grip.  Unable to extend at the wrist.  Some preserved eversion.  0/5 inversion at the wrist.  Able to lift both lower extremities against gravity.   Sensory: Pinprick and light touch decreased in the left pinky.   Deep Tendon Reflexes: Patient odes not allow to be tested in the upper extremities   Laboratory Studies:   Basic Metabolic Panel: Recent Labs  Lab 11/03/17 1918 11/04/17 0750  NA 138 138  K 3.3* 4.0  CL 100* 104  CO2 24 25  GLUCOSE 143* 113*  BUN 16 12  CREATININE 0.95 1.07*  CALCIUM 9.6 8.8*    Liver Function Tests: No results for input(s): AST, ALT, ALKPHOS, BILITOT, PROT, ALBUMIN in the last 168 hours. No results for input(s): LIPASE, AMYLASE in the last 168 hours. No results for input(s): AMMONIA in the last 168 hours.  CBC: Recent Labs  Lab 11/03/17 1918 11/05/17 0854  WBC 13.3* 9.5   NEUTROABS 12.3*  --   HGB 13.7 11.9*  HCT 43.6 36.5  MCV 85.9 84.6  PLT 173 148*    Cardiac Enzymes: Recent Labs  Lab 11/03/17 1918 11/03/17 2309 11/04/17 0750 11/04/17 1204 11/05/17 0854  CKTOTAL 774*  --  1,603*  --  843*  CKMB  --  27.1*  --   --   --   TROPONINI 0.30*  --  0.99* 0.99*  --     BNP: Invalid input(s): POCBNP  CBG: No results for input(s): GLUCAP in the last 168 hours.  Microbiology: No results found for this or any previous visit.  Coagulation Studies: Recent Labs    11/03/17 2309  LABPROT 13.3  INR 1.02    Urinalysis:  Recent Labs  Lab 11/04/17 0915  COLORURINE STRAW*  LABSPEC 1.002*  PHURINE 7.0  GLUCOSEU NEGATIVE  HGBUR MODERATE*  BILIRUBINUR NEGATIVE  KETONESUR NEGATIVE  PROTEINUR NEGATIVE  NITRITE NEGATIVE  LEUKOCYTESUR NEGATIVE    Lipid Panel:  No results found for: CHOL, TRIG, HDL, CHOLHDL, VLDL, LDLCALC  HgbA1C: No results found for: HGBA1C  Urine Drug Screen:  No results found for: LABOPIA, COCAINSCRNUR, LABBENZ, AMPHETMU, THCU, LABBARB  Alcohol Level: No results for input(s): ETH in the last 168 hours.  Other results: EKG: sinus rhythm at 75 bpm.  Imaging: Dg Elbow Complete Left  Result Date: 11/03/2017 CLINICAL DATA:  Fall. EXAM: LEFT ELBOW - COMPLETE 3+ VIEW COMPARISON:  None. FINDINGS: There is no evidence of fracture, dislocation, or joint effusion. There is no evidence of arthropathy or other focal bone abnormality. Soft tissues are unremarkable. IMPRESSION: Negative. Electronically Signed   By: Obie Dredge M.D.   On: 11/03/2017 19:33   Dg Wrist Complete Left  Result Date: 11/03/2017 CLINICAL DATA:  Fall.  Unable to move wrist. EXAM: LEFT WRIST - COMPLETE 3+ VIEW COMPARISON:  None. FINDINGS: There is no evidence of fracture or dislocation. Moderate degenerative changes of first CMC joint. Osteopenia. Soft tissues are unremarkable. IMPRESSION: Negative. Electronically Signed   By: Obie Dredge M.D.   On:  11/03/2017 19:36   Ct Head Wo Contrast  Result Date: 11/03/2017 CLINICAL DATA:  83 year old female with unwitnessed fall. EXAM: CT HEAD WITHOUT CONTRAST TECHNIQUE: Contiguous axial images were obtained from the base of the skull through the vertex without intravenous contrast. COMPARISON:  Head CT dated 01/07/2017 FINDINGS: Brain: There is moderate age-related atrophy and chronic microvascular ischemic changes. 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 Small focus of low-attenuation in the left cerebellar hemisphere, likely an old lacunar infarct. There is no acute intracranial hemorrhage. No mass effect or midline shift noted. No extra-axial fluid collection. Vascular: No hyperdense vessel or unexpected calcification. Skull: Normal. Negative for fracture or focal lesion. Sinuses/Orbits: There is mucoperiosteal thickening and partial opacification of the right sphenoid sinus. Remainder of the visualized paranasal sinuses and the mastoid air cells are clear. Other: None IMPRESSION: 1. No acute intracranial hemorrhage. 2. Age-related atrophy chronic microvascular ischemic changes. 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 Electronically Signed   By: Elgie Collard M.D.   On: 11/03/2017 20:04   Dg Shoulder Left  Result Date: 11/03/2017 CLINICAL DATA:  Postreduction for shoulder dislocation. EXAM: LEFT SHOULDER - 2+ VIEW COMPARISON:  Earlier same day FINDINGS: Three views study shows interval reduction of the humeral head dislocation. Hill-Sachs deformity evident. Tiny fragments in the acromiohumeral space suggests age indeterminate cortical fracture fragments. IMPRESSION: 1. Interval reduction of the left shoulder dislocation with Hill-Sachs deformity evident. 2. Possible intra-articular loose bodies/small fracture fragments. Electronically Signed   By: Kennith Center M.D.   On: 11/03/2017 20:23   Dg Shoulder Left  Result Date: 11/03/2017 CLINICAL DATA:   Fall. EXAM: LEFT SHOULDER - 2+ VIEW COMPARISON:  None. FINDINGS: Acute anterior dislocation of the humeral head with respect to the glenoid. Probable Hill-Sachs deformity. Mild degenerative changes of the acromioclavicular joint. IMPRESSION: Acute anterior shoulder dislocation with probable Hill-Sachs deformity. Electronically Signed   By: Obie Dredge M.D.   On: 11/03/2017 19:34     Assessment/Plan: 83 year old female presenting after a fall with a left wrist drop.  Patient will not allow more proximal muscle testing on the left arm but at this point appears to a have a severe posterior interosseous injury.  Likely compressive due to prolonged time down.  Due to limited examination can not rule out a plexus injury and patient refused MRI at this time.    Recommendations: 1.  Continued conservative therapy with OT 2.  If no improvement in symptoms patient to have a NCV/EMG on an outpatient basis and MRI of the brachial plexus which will need to be performed outpatient in an open MR machine.     Thana Farr, MD Neurology [phone] 11/05/2017, 10:22 AM

## 2017-11-05 NOTE — Plan of Care (Signed)
Patient has a shoulder immobilizer on the left shoulder. Motrin and Norco added to patient's PRN pain meds.. Patient rested well overnight without any acute event.

## 2017-11-05 NOTE — Evaluation (Signed)
Physical Therapy Evaluation Patient Details Name: Molly Parks MRN: [id number] DOB: 11/30/1928 Today's Date: 11/05/2017   History of Present Illness  Pt admitted for rhabdomyolosis. Pt with fall with caused L shoulder dislocation, reduced in ER. Pt now with complaints of L wrist drop and currently in immobilizer. Pt with increased troponins, however no acute MI per cardio. Ortho consult completed and cleared for therapy with diagnosis of radial nerve palsy. HIstory includes COPD and HTN. 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   Clinical Impression  Pt is a pleasant 83 year old female who was admitted for rhabdomyolysis from fall and L shoulder dislocation. Pt performs bed mobility with mod assist, transfers with mod assist, and ambulation with min assist and IV pole. Pt demonstrates deficits with strength/mobility/balance/endurance. Pt very fearful of falls and needs significant assistance for all mobility efforts. Pt is not safe to dc home at this time. Pt complains of immobilizer irritating skin, placed gauze around fabric for skin barrier protection. Educated on benefits of sitting in recliner, although pt very hesitant and wanted to transfer back to bed. Coordinated care with OT for continued therapy. Would benefit from skilled PT to address above deficits and promote optimal return to PLOF; recommend transition to STR upon discharge from acute hospitalization.       Follow Up Recommendations SNF    Equipment Recommendations  (possible HW vs SPC for balance)    Recommendations for Other Services       Precautions / Restrictions Precautions Precautions: Fall Required Braces or Orthoses: Other Brace/Splint Other Brace/Splint: shoulder immobilizer-worn at all times Restrictions Weight Bearing Restrictions: No      Mobility  Bed Mobility Overal bed mobility: Needs Assistance Bed Mobility: Supine to Sit     Supine to sit: Mod assist     General bed mobility comments: needs assist for  trunkal elevation and scooting out towards EOB. Once seated at EOB, slight dizziness noted.   Transfers Overall transfer level: Needs assistance Equipment used: (IV pole) Transfers: Sit to/from Stand Sit to Stand: Mod assist         General transfer comment: Pt cued for pushing from seated surface. Needs assist for rising to standing secondary to B LE weakness. Once standing, able to decreased assist to min assist and pt able to hold onto IV pole  Ambulation/Gait Ambulation/Gait assistance: Min assist Ambulation Distance (Feet): 20 Feet Assistive device: (IV Pole) Gait Pattern/deviations: Step-to pattern     General Gait Details: cautious and short steps around room with IV pole. Pt unsteady and very fearful of falling. Increased pain noted with exertion.  Stairs            Wheelchair Mobility    Modified Rankin (Stroke Patients Only)       Balance Overall balance assessment: History of Falls;Needs assistance Sitting-balance support: Feet supported;Single extremity supported Sitting balance-Leahy Scale: Good     Standing balance support: Single extremity supported Standing balance-Leahy Scale: Fair                               Pertinent Vitals/Pain Pain Assessment: Faces Faces Pain Scale: Hurts a little bit Pain Location: L shoulder with movement Pain Descriptors / Indicators: Discomfort;Dull Pain Intervention(s): Limited activity within patient's tolerance;Repositioned    Home Living Family/patient expects to be discharged to:: Private residence Living Arrangements: Children(son) Available Help at Discharge: Family;Available PRN/intermittently Type of Home: House Home Access: Stairs to enter Entrance Stairs-Rails:  Right Entrance Stairs-Number of Steps: 3 Home Layout: One level Home Equipment: Walker - 2 wheels;Walker - standard      Prior Function Level of Independence: Independent         Comments: previously independent,  occasionally used RW for long distances     Hand Dominance        Extremity/Trunk Assessment   Upper Extremity Assessment Upper Extremity Assessment: Generalized weakness(R UE grossly 4/5; unable to test L UE secondary to immoblize)    Lower Extremity Assessment Lower Extremity Assessment: Generalized weakness(B LE grossly 3+/5)       Communication   Communication: No difficulties  Cognition Arousal/Alertness: Awake/alert Behavior During Therapy: WFL for tasks assessed/performed Overall Cognitive Status: Within Functional Limits for tasks assessed                                        General Comments General comments (skin integrity, edema, etc.): sat at EOB approx 3-4 min, able to perform LAQ and marches while dizziness subsided    Exercises     Assessment/Plan    PT Assessment Patient needs continued PT services  PT Problem List Decreased strength;Decreased activity tolerance;Decreased balance;Decreased mobility;Pain       PT Treatment Interventions Gait training;Therapeutic activities;Therapeutic exercise;Balance training    PT Goals (Current goals can be found in the Care Plan section)  Acute Rehab PT Goals Patient Stated Goal: to get stronger PT Goal Formulation: With patient Time For Goal Achievement: 11/19/17 Potential to Achieve Goals: Good    Frequency Min 2X/week   Barriers to discharge Inaccessible home environment;Decreased caregiver support      Co-evaluation               AM-PAC PT "6 Clicks" Daily Activity  Outcome Measure Difficulty turning over in bed (including adjusting bedclothes, sheets and blankets)?: Unable Difficulty moving from lying on back to sitting on the side of the bed? : Unable Difficulty sitting down on and standing up from a chair with arms (e.g., wheelchair, bedside commode, etc,.)?: Unable Help needed moving to and from a bed to chair (including a wheelchair)?: A Little Help needed walking in  hospital room?: A Little Help needed climbing 3-5 steps with a railing? : A Lot 6 Click Score: 11    End of Session Equipment Utilized During Treatment: Gait belt Activity Tolerance: Patient limited by pain Patient left: in chair;with chair alarm set Nurse Communication: Mobility status(RN aide notified of external cath removal) PT Visit Diagnosis: Unsteadiness on feet (R26.81);Muscle weakness (generalized) (M62.81);History of falling (Z91.81);Difficulty in walking, not elsewhere classified (R26.2);Pain Pain - Right/Left: Left Pain - part of body: Shoulder    Time: [id number] PT Time Calculation (min) (ACUTE ONLY): 42 min   Charges:   PT Evaluation $PT Eval Moderate Complexity: 1 Mod PT Treatments $Therapeutic Activity: 23-37 mins   PT G Codes:   PT G-Codes **NOT FOR INPATIENT CLASS** Functional Assessment Tool Used: AM-PAC 6 Clicks Basic Mobility Functional Limitation: Mobility: Walking and moving around Mobility: Walking and Moving Around Current Status ([id number]): At least 60 percent but less than 80 percent impaired, limited or restricted Mobility: Walking and Moving Around Goal Status [phone](G8979): At least 40 percent but less than 60 percent impaired, limited or restricted    Elizabeth PalauStephanie Gevork Ayyad, PT, DPT [phone]   Montez Cuda 11/05/2017, 10:44 AM

## 2017-11-05 NOTE — Care Management Note (Signed)
Case Management Note  Patient Details  Name: Molly Parks MRN: [id number] Date of Birth: 04/24/1928  Subjective/Objective:                 Physical therapy has recommended skilled nursing facility placement which patient adamantly declines.  She wants to discharge home with home health services.  Her son Sherrine MaplesGlenn at the bedside is in agreement.  he is getting a Med Alert  in the home and verbalizes understanding that patient should not be left alone at present.    Action/Plan:  Notified [facility] of discharge.  Provided with list of private duty agencies.  Expected Discharge Date:  11/05/17               Expected Discharge Plan:  Home w Home Health Services  In-House Referral:     Discharge planning Services  CM Consult  Post Acute Care Choice:    Choice offered to:  Adult Children(Son Sherrine Maples- Glenn)  DME Arranged:    DME Agency:     HH Arranged:  RN, Nurse's Aide, OT, PT, Social Work [facility]  Status of Service:  Completed, signed off  If discussed at [employer] Meetings, dates discussed:    Additional Comments:  Eber HongGreene, Omaya Nieland R, RN 11/05/2017, 2:39 PM

## 2017-11-05 NOTE — Discharge Instructions (Signed)
HHPT, OT, RN, NA, SW. Fall precaution. Left wrist drop,  can not rule out a plexus injury. If no improvement in symptoms patient to have a NCV/EMG on an outpatient basis and MRI of the brachial plexus which will need to be performed outpatient in an open MR machine.

## 2017-11-12 IMAGING — DX DG CHEST 1V PORT
1 series · 1 of 1 positions shown · non-contrast
Comparison: Chest radiograph performed 01/07/2017

CLINICAL DATA: Acute onset of generalized fatigue and nausea.
Initial encounter.

EXAM:
PORTABLE CHEST 1 VIEW

[chest ap]
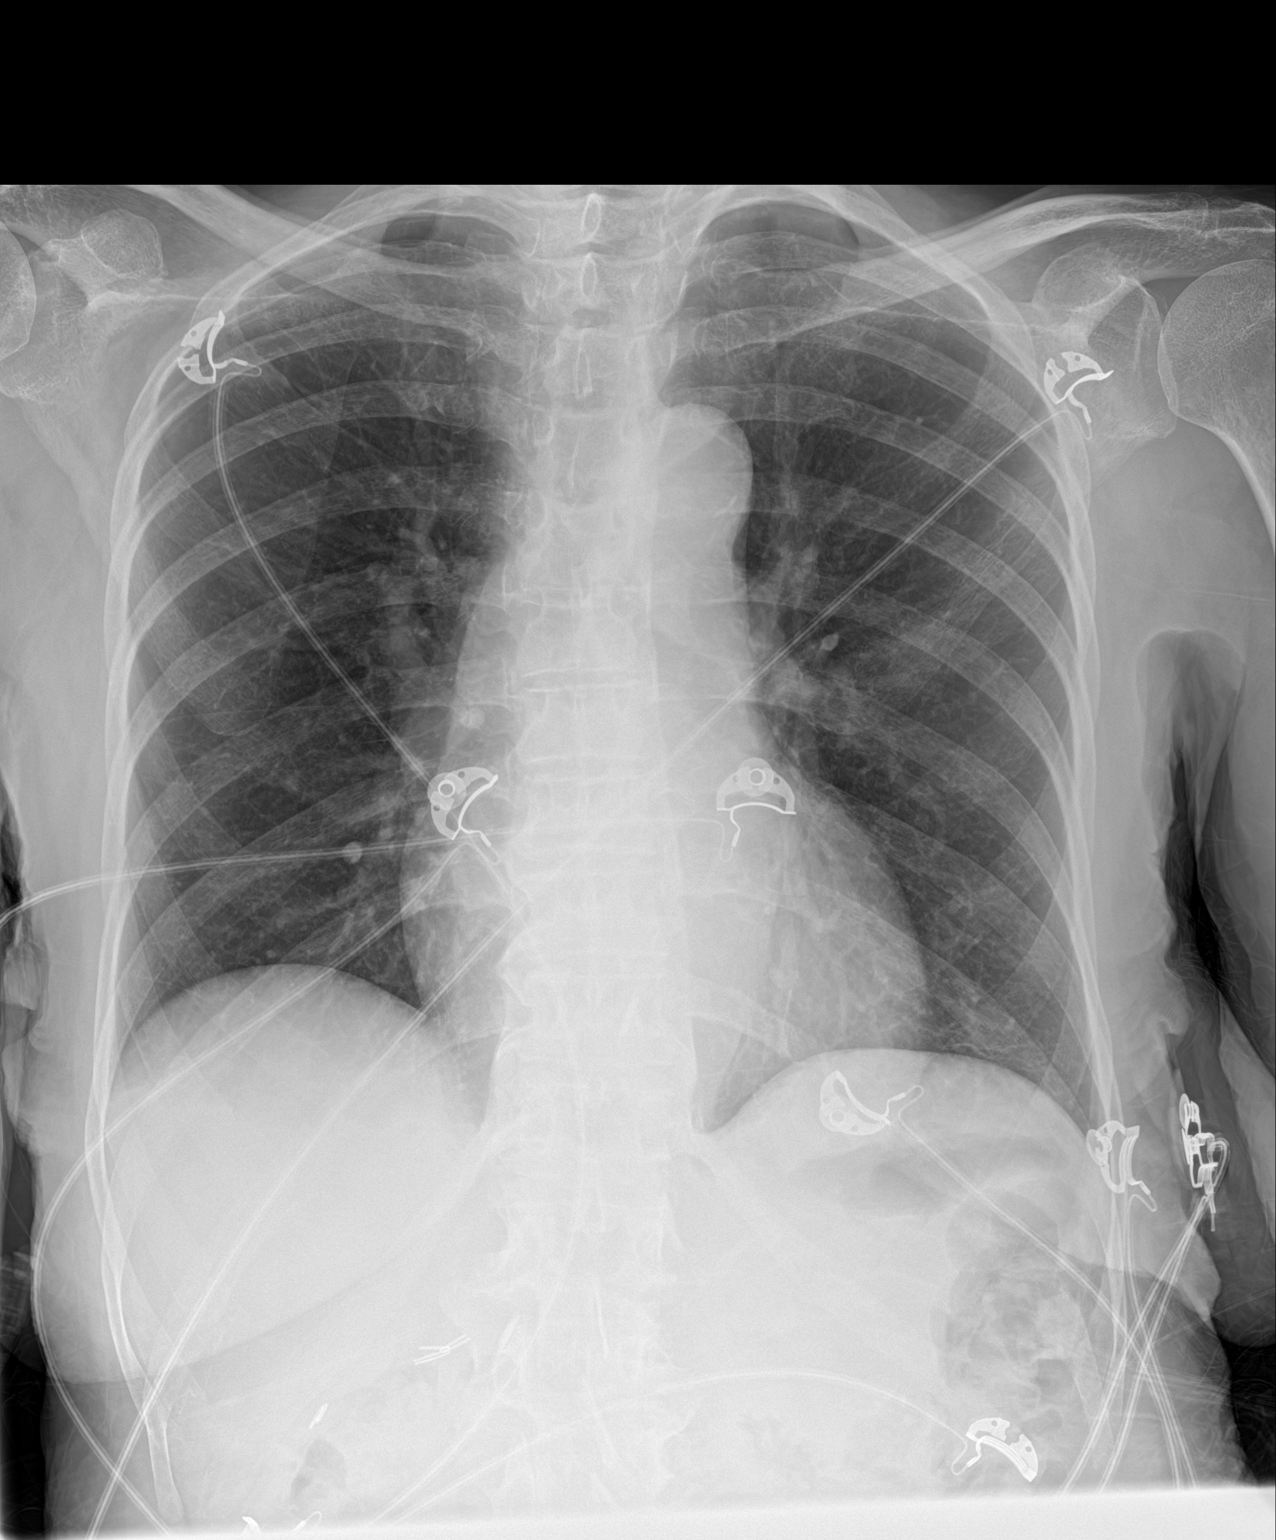

[1 of 1 positions shown; findings below may reference images not displayed]

FINDINGS: The lungs are well-aerated and clear. There is no evidence of focal
opacification, pleural effusion or pneumothorax.

The cardiomediastinal silhouette is within normal limits. No acute
osseous abnormalities are seen. Clips are noted within the right
upper quadrant, reflecting prior cholecystectomy.
IMPRESSION: No acute cardiopulmonary process seen.

## 2018-01-15 ENCOUNTER — Ambulatory Visit: Payer: Medicare Other | Attending: Unknown Physician Specialty | Admitting: Occupational Therapy

## 2018-01-15 DIAGNOSIS — M25622 Stiffness of left elbow, not elsewhere classified: Secondary | ICD-10-CM | POA: Insufficient documentation

## 2018-01-15 DIAGNOSIS — M79602 Pain in left arm: Secondary | ICD-10-CM | POA: Insufficient documentation

## 2018-01-15 DIAGNOSIS — M25642 Stiffness of left hand, not elsewhere classified: Secondary | ICD-10-CM | POA: Insufficient documentation

## 2018-01-15 DIAGNOSIS — M25632 Stiffness of left wrist, not elsewhere classified: Secondary | ICD-10-CM | POA: Insufficient documentation

## 2018-01-20 ENCOUNTER — Encounter: Payer: Self-pay | Admitting: Occupational Therapy

## 2018-01-20 ENCOUNTER — Other Ambulatory Visit: Payer: Self-pay

## 2018-01-20 ENCOUNTER — Ambulatory Visit: Payer: Medicare Other | Admitting: Occupational Therapy

## 2018-01-20 DIAGNOSIS — M25642 Stiffness of left hand, not elsewhere classified: Secondary | ICD-10-CM | POA: Diagnosis present

## 2018-01-20 DIAGNOSIS — M25622 Stiffness of left elbow, not elsewhere classified: Secondary | ICD-10-CM

## 2018-01-20 DIAGNOSIS — M79602 Pain in left arm: Secondary | ICD-10-CM

## 2018-01-20 DIAGNOSIS — M25632 Stiffness of left wrist, not elsewhere classified: Secondary | ICD-10-CM

## 2018-01-20 NOTE — Therapy (Signed)
[facility] [address] [place], [address] Phone: [phone]   Fax:  [phone]  Occupational Therapy Evaluation  Patient Details  Name: Molly Parks MRN: [id number] Date of Birth: Jun 22, 1928 Referring Provider: De Blanch   Encounter Date: 01/20/2018  OT End of Session - 01/20/18 2030    Visit Number  1    Number of Visits  1    Date for OT Re-Evaluation  01/20/18    OT Start Time  1325    OT Stop Time  1430    OT Time Calculation (min)  65 min    Activity Tolerance  Patient tolerated treatment well;Patient limited by pain    Behavior During Therapy  [facility] for tasks assessed/performed       Past Medical History:  Diagnosis Date  . COPD (chronic obstructive pulmonary disease) (HCC)   . Dislocation, shoulder closed, left, initial encounter   . Hypertension     Past Surgical History:  Procedure Laterality Date  . OOPHORECTOMY Right     There were no vitals filed for this visit.  Subjective Assessment - 01/20/18 2024    Subjective   I fell and dislocated my shoulder and then they had my arm in sling against my body with wrist and hand in sling or splint - I try and exercise my fingers- I was still was driving prior to my fall     Patient Stated Goals  I am here for splint for my fingers and hand and for you to look at it     Currently in Pain?  Yes    Pain Score  6     Pain Location  Arm    Pain Orientation  Left        [facility] OT Assessment - 01/20/18 0001      Assessment   Medical Diagnosis  L brachial plexus / L shoulder dislocation     Referring Provider  De Blanch    Onset Date/Surgical Date  11/03/17    Hand Dominance  Right      Home  Environment   Lives With  Son      Prior Function   Vocation  Retired    Leisure   Was nurse prior to retirement ,  did own ADL;s , drove       AROM   Overall AROM Comments  MC  A flexion 70 degrees, ext -30 ;   PIP's A flexion 70-80 and  extention -40 - 3rd PIP       Left Hand AROM   L Thumb IP 0-80  20 Degrees AROM and back into extnetion     L Thumb Palmar ADduction/ABduction 0-45  45 AROM           Custom wrist splint on most of time Ed on  Precautions - and contact me or HH OT if rubbing  Did provided tubigrip to wear under splint - but she has hard time getting hard slid thru it but can use with HHOT and son under splint if needed   Take off 2-3 x day for HEP  Contrast to L hand and wrist  PROM for MC flexion and extention  PROM for PIP flexion and extention  Composite fist to 4 cm foam block - PROM and place and hold  And PROM extnetion  10 reps  All  AROM and PROM for thumb PA  Pick up 2 cm foam block alternate digits  for opposition   PROM for supination and wrist extention  10 reps                OT Education - 01/20/18 2030    Education provided  Yes    Education Details  Findings of eval and HEP and splint wearing     Person(s) Educated  Patient;Child(ren)    Methods  Explanation;Demonstration;Verbal cues;Tactile cues;Handout    Comprehension  Need further instruction;Verbalized understanding;Returned demonstration          OT Long Term Goals - 01/20/18 2038      OT LONG TERM GOAL #1   Title  Pt and son verbalize understanding of homeprogram and  splint wearing to position wrist in neutral and to [facility] info and [facility] info [facility]     Baseline  no knowledge prior             Plan - 01/20/18 2031    Clinical Impression Statement  Pt had fall in Nov 18 that resulted in L shoulder dislocation and per MD notes brachial plexus injury - Dr Gavin PottersKernodle refer pt to hand therapy for dynamic splint for digits ROM - upon evaluation this date  pt and sone arrive - pt  in prefab wrist splint - wrist kept in some lfexion in splint - and pt show increase edema in digits , and extreme stiffness in thumb and digits as well as wrist  - BUT UPON assessment pt had partial AROM in  thumb PA , thumb flexion , digits flexion and extention of PIP's and MC's - as well as supiination/pronation and wrist flexion and extention  - phone Dr Gavin PottersKernodle and discuss that  I do not feel she will benefit form dynamic splint at this time becuase of extreme stiffness - and would benefit from OT for  contrast, manual therapy , spliting and PROM/AROM  to all joints and All planes  - did fabricat pt custom forearm base volar wrsit splint to keep wrist in neutral  - provided HEP to start off [facility] and provided my contact info - pt and son in agreement  and report in session this is the most motion they had seen in her hand since injury     Occupational performance deficits (Please refer to evaluation for details):  ADL's;IADL's;Rest and Sleep;Play;Leisure    OT Frequency  One time visit    OT Treatment/Interventions  Splinting;Therapeutic exercise    Plan  Pt will have Homehealth services - home bound  came only to me for custom splint     Clinical Decision Making  Multiple treatment options, significant modification of task necessary    OT Home Exercise Plan  see pt instruction    Consulted and Agree with Plan of Care  Patient;Family member/caregiver       Patient will benefit from skilled therapeutic intervention in order to improve the following deficits and impairments:     Visit Diagnosis: Stiffness of left hand, not elsewhere classified - Plan: Ot plan of care cert/re-cert  Stiffness of left wrist, not elsewhere classified - Plan: Ot plan of care cert/re-cert  Stiffness of left elbow, not elsewhere classified - Plan: Ot plan of care cert/re-cert  Pain in left arm - Plan: Ot plan of care cert/re-cert    Problem List Patient Active Problem List   Diagnosis Date Noted  . Rhabdomyolysis 11/03/2017    Oletta CohnuPreez, Quinnton Bury OTR/L,CLT 01/20/2018, 8:47 PM  [facility] [address] [place],  [address] Phone: [phone]   Fax:  [phone]  Name: Molly Parks MRN: [id number] Date of Birth: [birth year]

## 2018-01-20 NOTE — Patient Instructions (Signed)
Custom wrist splint on most of time  Take off 2-3 x day for HEP  Contrast to L hand and wrist  PROM for MC flexion and extention  PROM for PIP flexion and extention  Composite fist to 4 cm foam block - PROM and place and hold  And PROM extnetion  10 reps  All  AROM and PROM for thumb PA  Pick up 2 cm foam block alternate digits for opposition   PROM for supination and wrist extention  10 reps

## 2018-02-15 IMAGING — DX DG SHOULDER 2+V*L*
2 series · 2 of 2 positions shown · non-contrast
Comparison: None.

CLINICAL DATA: Fall.

EXAM:
LEFT SHOULDER - 2+ VIEW

[shoulder axial (1 of 2)]
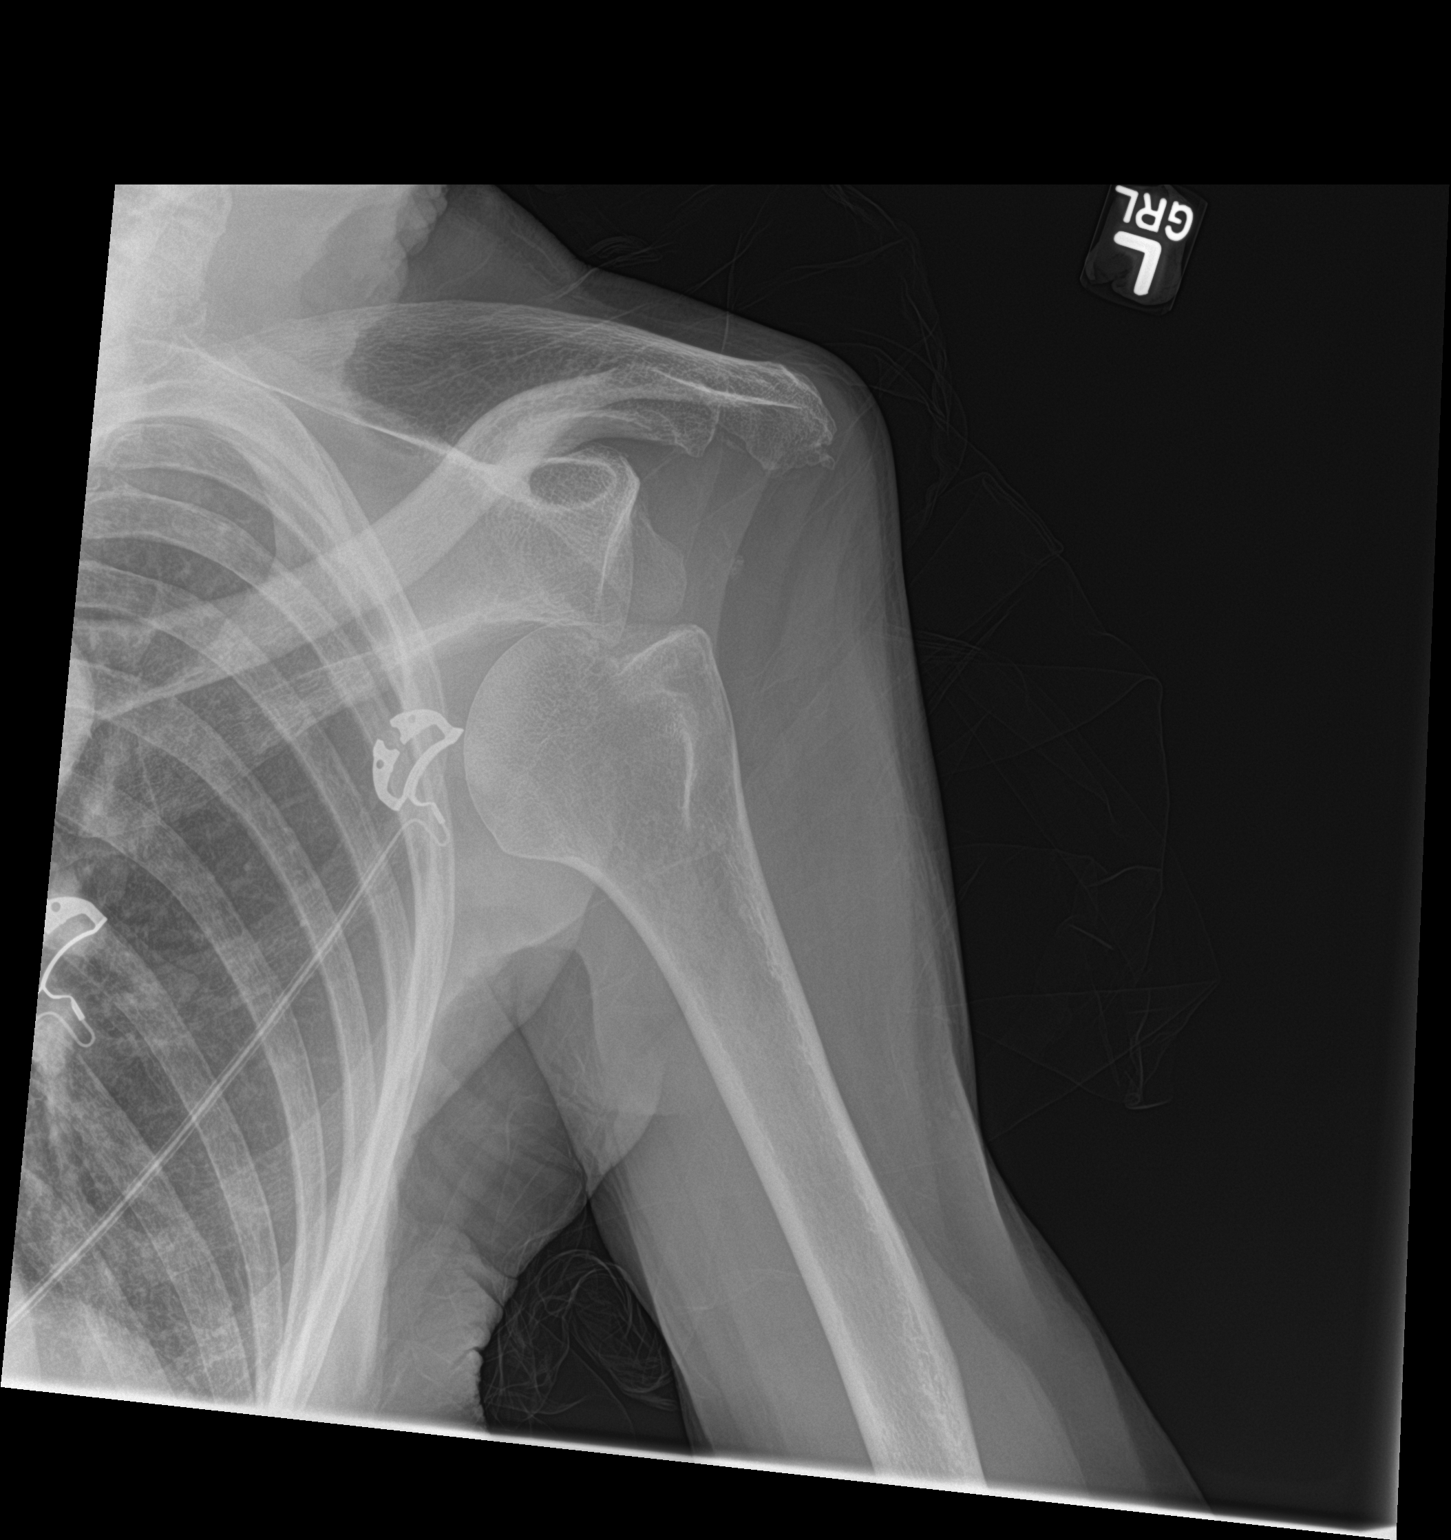

[shoulder axial (2 of 2)]
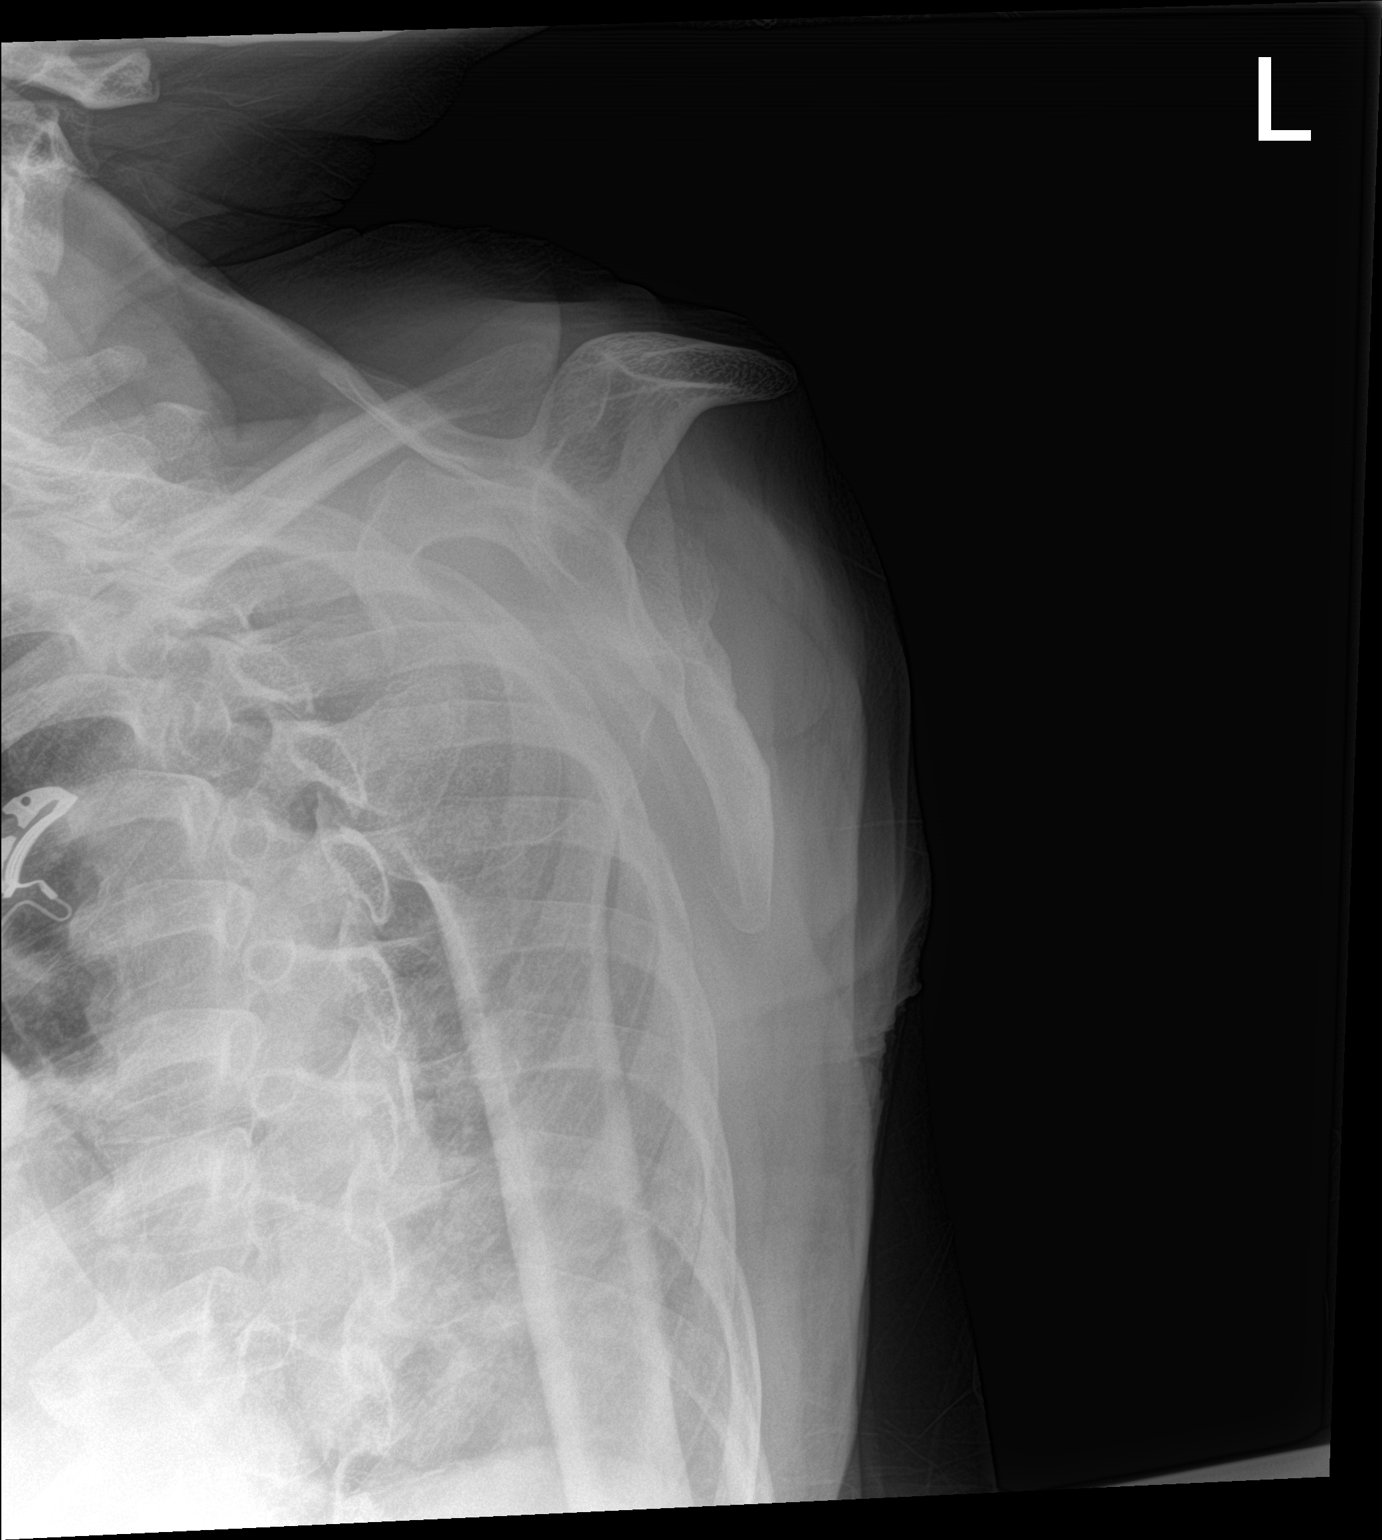

[2 of 2 positions shown; findings below may reference images not displayed]

FINDINGS: Acute anterior dislocation of the humeral head with respect to the
glenoid. Probable Hill-Sachs deformity. Mild degenerative changes of
the acromioclavicular joint.
IMPRESSION: Acute anterior shoulder dislocation with probable Hill-Sachs
deformity.

## 2018-03-10 ENCOUNTER — Emergency Department: Payer: Medicare Other

## 2018-03-10 ENCOUNTER — Inpatient Hospital Stay
Admission: EM | Admit: 2018-03-10 | Discharge: 2018-03-11 | DRG: 378 | Disposition: A | Discharge status: Home Health | Payer: Medicare Other | Attending: Internal Medicine | Admitting: Internal Medicine

## 2018-03-10 ENCOUNTER — Other Ambulatory Visit: Payer: Self-pay

## 2018-03-10 ENCOUNTER — Other Ambulatory Visit: Payer: PRIVATE HEALTH INSURANCE

## 2018-03-10 ENCOUNTER — Encounter: Payer: Self-pay | Admitting: Emergency Medicine

## 2018-03-10 DIAGNOSIS — J449 Chronic obstructive pulmonary disease, unspecified: Secondary | ICD-10-CM | POA: Diagnosis present

## 2018-03-10 DIAGNOSIS — I10 Essential (primary) hypertension: Secondary | ICD-10-CM | POA: Diagnosis present

## 2018-03-10 DIAGNOSIS — K922 Gastrointestinal hemorrhage, unspecified: Secondary | ICD-10-CM

## 2018-03-10 DIAGNOSIS — E785 Hyperlipidemia, unspecified: Secondary | ICD-10-CM | POA: Diagnosis present

## 2018-03-10 DIAGNOSIS — Z881 Allergy status to other antibiotic agents status: Secondary | ICD-10-CM

## 2018-03-10 DIAGNOSIS — K5731 Diverticulosis of large intestine without perforation or abscess with bleeding: Secondary | ICD-10-CM | POA: Diagnosis not present

## 2018-03-10 DIAGNOSIS — Z79899 Other long term (current) drug therapy: Secondary | ICD-10-CM

## 2018-03-10 DIAGNOSIS — D62 Acute posthemorrhagic anemia: Secondary | ICD-10-CM | POA: Diagnosis not present

## 2018-03-10 DIAGNOSIS — Z8673 Personal history of transient ischemic attack (TIA), and cerebral infarction without residual deficits: Secondary | ICD-10-CM | POA: Diagnosis not present

## 2018-03-10 DIAGNOSIS — Z880 Allergy status to penicillin: Secondary | ICD-10-CM

## 2018-03-10 DIAGNOSIS — Z7982 Long term (current) use of aspirin: Secondary | ICD-10-CM | POA: Diagnosis not present

## 2018-03-10 DIAGNOSIS — R58 Hemorrhage, not elsewhere classified: Secondary | ICD-10-CM

## 2018-03-10 LAB — COMPREHENSIVE METABOLIC PANEL
ALBUMIN: 2.5 g/dL — AB (ref 3.5–5.0)
ALK PHOS: 32 U/L — AB (ref 38–126)
ALT: 7 U/L — ABNORMAL LOW (ref 14–54)
AST: 17 U/L (ref 15–41)
Anion gap: 7 (ref 5–15)
BILIRUBIN TOTAL: 0.5 mg/dL (ref 0.3–1.2)
BUN: 15 mg/dL (ref 6–20)
CO2: 25 mmol/L (ref 22–32)
Calcium: 7.7 mg/dL — ABNORMAL LOW (ref 8.9–10.3)
Chloride: 106 mmol/L (ref 101–111)
Creatinine, Ser: 0.9 mg/dL (ref 0.44–1.00)
GFR calc Af Amer: >60 mL/min (ref >60)
GFR calc non Af Amer: 56 mL/min — ABNORMAL LOW (ref >60)
GLUCOSE: 147 mg/dL — AB (ref 65–99)
POTASSIUM: 4 mmol/L (ref 3.5–5.1)
Sodium: 138 mmol/L (ref 135–145)
TOTAL PROTEIN: 4.8 g/dL — AB (ref 6.5–8.1)

## 2018-03-10 LAB — CBC WITH DIFFERENTIAL/PLATELET
BASOS PCT: 1 %
Basophils Absolute: 0 10*3/uL (ref 0–0.1)
EOS ABS: 0.1 10*3/uL (ref 0–0.7)
Eosinophils Relative: 1 %
HEMATOCRIT: 33.9 % — AB (ref 35.0–47.0)
HEMOGLOBIN: 10.9 g/dL — AB (ref 12.0–16.0)
LYMPHS ABS: 4.1 10*3/uL — AB (ref 1.0–3.6)
Lymphocytes Relative: 46 %
MCH: 26.8 pg (ref 26.0–34.0)
MCHC: 32.1 g/dL (ref 32.0–36.0)
MCV: 83.5 fL (ref 80.0–100.0)
Monocytes Absolute: 0.6 10*3/uL (ref 0.2–0.9)
Monocytes Relative: 7 %
NEUTROS ABS: 3.9 10*3/uL (ref 1.4–6.5)
NEUTROS PCT: 45 %
Platelets: 247 10*3/uL (ref 150–440)
RBC: 4.07 MIL/uL (ref 3.80–5.20)
RDW: 16.7 % — ABNORMAL HIGH (ref 11.5–14.5)
WBC: 8.7 10*3/uL (ref 3.6–11.0)

## 2018-03-10 LAB — PROTIME-INR
INR: 1.2
Prothrombin Time: 15.1 seconds (ref 11.4–15.2)

## 2018-03-10 LAB — APTT: APTT: 27 s (ref 24–36)

## 2018-03-10 LAB — ABO/RH: ABO/RH(D): B POS

## 2018-03-10 LAB — LACTIC ACID, PLASMA
LACTIC ACID, VENOUS: 1.8 mmol/L (ref 0.5–1.9)
Lactic Acid, Venous: 2.1 mmol/L (ref 0.5–1.9)

## 2018-03-10 LAB — POCT I-STAT CREATININE: CREATININE: 0.7 mg/dL (ref 0.44–1.00)

## 2018-03-10 MED ORDER — ACETAMINOPHEN 650 MG RE SUPP: 650.0000 mg | Freq: Four times a day (QID) | RECTAL | Status: DC | PRN

## 2018-03-10 MED ORDER — CLONAZEPAM 0.5 MG PO TABS
0.2500 mg | ORAL_TABLET | Freq: Two times a day (BID) | ORAL | Status: DC | PRN
  Administered 2018-03-10: 0.25 mg via ORAL
  Filled 2018-03-10 (×2): qty 1

## 2018-03-10 MED ORDER — POLYETHYLENE GLYCOL 3350 17 G PO PACK: 17.0000 g | PACK | Freq: Every day | ORAL | Status: DC | PRN

## 2018-03-10 MED ORDER — METOPROLOL TARTRATE 25 MG PO TABS
12.5000 mg | ORAL_TABLET | Freq: Two times a day (BID) | ORAL | Status: DC
  Administered 2018-03-10 – 2018-03-11 (×3): 12.5 mg via ORAL
  Filled 2018-03-10 (×3): qty 1

## 2018-03-10 MED ORDER — SODIUM CHLORIDE 0.9 % IV SOLN
INTRAVENOUS | Status: DC
  Administered 2018-03-10: 23:00:00 via INTRAVENOUS

## 2018-03-10 MED ORDER — SODIUM CHLORIDE 0.9 % IV BOLUS (SEPSIS)
1000.0000 mL | Freq: Once | INTRAVENOUS | Status: AC
  Administered 2018-03-10: 1000 mL via INTRAVENOUS

## 2018-03-10 MED ORDER — SODIUM CHLORIDE 0.9 % IV SOLN
10.0000 mL/h | Freq: Once | INTRAVENOUS | Status: AC
  Administered 2018-03-10: 10 mL/h via INTRAVENOUS

## 2018-03-10 MED ORDER — ONDANSETRON HCL 4 MG PO TABS: 4.0000 mg | ORAL_TABLET | Freq: Four times a day (QID) | ORAL | Status: DC | PRN

## 2018-03-10 MED ORDER — ACETAMINOPHEN 325 MG PO TABS: 650.0000 mg | ORAL_TABLET | Freq: Four times a day (QID) | ORAL | Status: DC | PRN

## 2018-03-10 MED ORDER — ONDANSETRON HCL 4 MG/2ML IJ SOLN: 4.0000 mg | Freq: Four times a day (QID) | INTRAMUSCULAR | Status: DC | PRN

## 2018-03-10 MED ORDER — IOPAMIDOL (ISOVUE-370) INJECTION 76%
100.0000 mL | Freq: Once | INTRAVENOUS | Status: AC | PRN
Start: 2018-03-10 — End: 2018-03-10
  Administered 2018-03-10: 100 mL via INTRAVENOUS

## 2018-03-10 NOTE — ED Notes (Signed)
Emergency blood given with verbal order by MD Don PerkingVeronese. Blood verified with RN Morrie SheldonAshley. I unit (300ml) O positive given.

## 2018-03-10 NOTE — H&P (Signed)
[facility] [place] [facility]   PATIENT NAME: Molly Parks    MR#:  [id number]  DATE OF BIRTH:  05/02/1928  DATE OF ADMISSION:  03/10/2018  PRIMARY CARE PHYSICIAN: Dione Housekeeperlmedo, Mario Ernesto, MD   REQUESTING/REFERRING PHYSICIAN: dr Don Perkingveronese  CHIEF COMPLAINT:   Rectal bleeding HISTORY OF PRESENT ILLNESS:  Molly Parks  is a 84 y.o. female with a known history of CVA and COPD who presents today to the emergency room due to bloody bowel movement. Son is at bedside and stated that the patient called him early this morning because she has noticed blood in her underpants. EMS was called.  While patient was being moved into ambulance she had a brief episode of loss in consciousness which resolved when she laid flat.  In the emergency room she had several large bloody bowel movements with clots.  She is not on NSAIDs or blood thinners. She denies abdominal pain, nausea or vomiting.  She recently had constipation and her son was giving her stool softeners.  CT scan in the emergency room did not show evidence of acute pathology.  PAST MEDICAL HISTORY:   Past Medical History:  Diagnosis Date  . COPD (chronic obstructive pulmonary disease) (HCC)   . Dislocation, shoulder closed, left, initial encounter   . Hypertension     PAST SURGICAL HISTORY:   Past Surgical History:  Procedure Laterality Date  . OOPHORECTOMY Right     SOCIAL HISTORY:   Social History   Tobacco Use  . Smoking status: Never Smoker  . Smokeless tobacco: Never Used  Substance Use Topics  . Alcohol use: No    FAMILY HISTORY:  No family history on file.  DRUG ALLERGIES:   Allergies  Allergen Reactions  . Ciprofloxacin   . Penicillins     Has patient had a PCN reaction causing immediate rash, facial/tongue/throat swelling, SOB or lightheadedness with hypotension: Unknown Has patient had a PCN reaction causing severe rash involving mucus membranes or skin necrosis: Unknown Has patient had  a PCN reaction that required hospitalization: Unknown Has patient had a PCN reaction occurring within the last 10 years: Unknown If all of the above answers are "NO", then may proceed with Cephalosporin use.    REVIEW OF SYSTEMS:   Review of Systems  Constitutional: Negative.  Negative for chills, fever and malaise/fatigue.  HENT: Negative.  Negative for ear discharge, ear pain, hearing loss, nosebleeds and sore throat.   Eyes: Negative.  Negative for blurred vision and pain.  Respiratory: Negative.  Negative for cough, hemoptysis, shortness of breath and wheezing.   Cardiovascular: Negative.  Negative for chest pain, palpitations and leg swelling.  Gastrointestinal: Positive for blood in stool. Negative for abdominal pain, diarrhea, nausea and vomiting.  Genitourinary: Negative.  Negative for dysuria.  Musculoskeletal: Negative.  Negative for back pain.  Skin: Negative.   Neurological: Negative for dizziness, tremors, speech change, focal weakness, seizures and headaches.  Endo/Heme/Allergies: Negative.  Does not bruise/bleed easily.  Psychiatric/Behavioral: Negative.  Negative for depression, hallucinations and suicidal ideas.    MEDICATIONS AT HOME:   Prior to Admission medications   Medication Sig Start Date End Date Taking? Authorizing Provider  aspirin 325 MG EC tablet Take 325 mg daily by mouth.   Yes [provider]  clonazePAM (KLONOPIN) 0.5 MG tablet Take 0.25 mg 2 (two) times daily as needed by mouth.  09/10/17  Yes [provider]  metoprolol tartrate (LOPRESSOR) 25 MG tablet Take 0.5 tablets (12.5 mg total) 2 (  two) times daily by mouth. 11/05/17  Yes Shaune Pollack, MD  bisacodyl (DULCOLAX) 5 MG EC tablet Take 1 tablet (5 mg total) daily as needed by mouth for moderate constipation. 11/05/17   Shaune Pollack, MD  docusate sodium (COLACE) 100 MG capsule Take 1 capsule (100 mg total) by mouth 2 (two) times daily. Patient not taking: Reported on 11/03/2017 08/01/17    Irean Hong, MD  HYDROcodone-acetaminophen (NORCO/VICODIN) 5-325 MG tablet Take 1 tablet every 6 (six) hours as needed by mouth for moderate pain or severe pain. Patient not taking: Reported on 03/10/2018 11/05/17   Shaune Pollack, MD  meclizine (ANTIVERT) 25 MG tablet Take 1 tablet (25 mg total) by mouth 3 (three) times daily as needed for dizziness. Patient not taking: Reported on 11/03/2017 01/07/17   Rockne Menghini, MD  ondansetron (ZOFRAN ODT) 4 MG disintegrating tablet Take 1 tablet (4 mg total) by mouth every 8 (eight) hours as needed for nausea or vomiting. Patient not taking: Reported on 11/03/2017 08/01/17   Irean Hong, MD      VITAL SIGNS:  Blood pressure (!) 159/79, pulse 77, temperature 97.6 F (36.4 C), temperature source Axillary, resp. rate (!) 23, height 5\' 9"  (1.753 m), weight 64.4 kg (142 lb), SpO2 100 %.  PHYSICAL EXAMINATION:   Physical Exam  Constitutional: She is oriented to person, place, and time. No distress.  Thin and frail  HENT:  Head: Normocephalic.  Eyes: No scleral icterus.  Neck: Normal range of motion. Neck supple. No JVD present. No tracheal deviation present.  Cardiovascular: Normal rate, regular rhythm and normal heart sounds. Exam reveals no gallop and no friction rub.  No murmur heard. Pulmonary/Chest: Effort normal and breath sounds normal. No respiratory distress. She has no wheezes. She has no rales. She exhibits no tenderness.  Abdominal: Soft. Bowel sounds are normal. She exhibits no distension and no mass. There is no tenderness. There is no rebound and no guarding.  Musculoskeletal: Normal range of motion. She exhibits no edema.  Neurological: She is alert and oriented to person, place, and time.  Skin: Skin is warm. No rash noted. No erythema.  Psychiatric: Affect and judgment normal.      LABORATORY PANEL:   CBC Recent Labs  Lab 03/10/18 0734  WBC 8.7  HGB 10.9*  HCT 33.9*  PLT 247    ------------------------------------------------------------------------------------------------------------------  Chemistries  Recent Labs  Lab 03/10/18 0833  NA 138  K 4.0  CL 106  CO2 25  GLUCOSE 147*  BUN 15  CREATININE 0.90  CALCIUM 7.7*  AST 17  ALT 7*  ALKPHOS 32*  BILITOT 0.5   ------------------------------------------------------------------------------------------------------------------  Cardiac Enzymes No results for input(s): TROPONINI in the last 168 hours. ------------------------------------------------------------------------------------------------------------------  RADIOLOGY:  Ct Angio Abd/pel W And/or Wo Contrast  Result Date: 03/10/2018 CLINICAL DATA:  Acute lower GI bleeding EXAM: CTA ABDOMEN AND PELVIS wITHOUT AND WITH CONTRAST TECHNIQUE: Multidetector CT imaging of the abdomen and pelvis was performed using the standard protocol during bolus administration of intravenous contrast. Multiplanar reconstructed images and MIPs were obtained and reviewed to evaluate the vascular anatomy. CONTRAST:  ISOVUE-370 IOPAMIDOL (ISOVUE-370) INJECTION 76% COMPARISON:  None. FINDINGS: VASCULAR GI bleeding: There is no focal contrast extravasation within large bowel during arterial phase. There is no obvious active extravasation of contrast within small bowel or the stomach. Overall, there is no convincing evidence of active gastrointestinal bleeding on this study. Aorta: Nonaneurysmal and patent. Mild atherosclerotic calcifications are present. Celiac: Patent. SMA: Patent. Renals:  There is atherosclerotic calcification at the origin of both renal arteries. There is no obvious significant narrowing of the single renal arteries. IMA: Patent and diminutive.  Branch vessels patent. Inflow: There are atherosclerotic calcifications in the common iliac arteries without significant narrowing. Internal and external iliac arteries are nonaneurysmal and patent. Proximal Outflow:  Grossly patent. Veins: There is low-density within the bilateral common femoral veins which may simply represent mixing artifact. DVT is not excluded. Review of the MIP images confirms the above findings. NON-VASCULAR Lower chest: Dependent atelectasis bilaterally. Small right pleural effusion. Hepatobiliary: Diffuse hepatic steatosis.  Postcholecystectomy. Pancreas: Unremarkable. Spleen: Calcified granulomata. Adrenals/Urinary Tract: Chronic changes in the kidneys mild hypertrophy of the adrenal glands in a hyperplasia pattern. Decompressed. Stomach/Bowel: There is no obvious mass in the colon. As stated above, there is no active contrast extravasation in the colon. Appendix is normal. Diverticulosis of the sigmoid colon is present. There is no evidence of small-bowel obstruction. Stomach and duodenum are grossly unremarkable. Lymphatic: No abnormal retroperitoneal adenopathy. Reproductive: Uterus is grossly unremarkable. Adnexa are within normal limits. Other: Trace free fluid in the right hemipelvis. Musculoskeletal: No vertebral compression deformity. Lumbar degenerative disc disease. IMPRESSION: VASCULAR There is no evidence of active gastrointestinal bleeding. Specifically, there is no evidence of extravasated arterial phase contrast throughout the colon. NON-VASCULAR No acute intra-abdominal pathology. Small right pleural effusion. Electronically Signed   By: Jolaine Click M.D.   On: 03/10/2018 09:09    EKG:  Normal sinus rhythm with no ST elevation or depression  IMPRESSION AND PLAN:   84 year old female with history of COPD who presents with rectal bleeding.  1.  Rectal bleeding: Patient underwent CT scan which shows no evidence of GI bleeding GI consultation requested  2.  Acute blood loss anemia: Patient is status post 2 unit PRBCs for large amount of rectal bleeding seen in the emergency room Today's hemoglobin likely does not represent true hemoglobin. Follow CBC  3.  Hypocalcemia which  corrects with low albumin  4.  COPD without signs of exacerbation  5.  Essential hypertension: Blood pressure will be able to tolerate home dose of metoprolol    All the records are reviewed and case discussed with ED provider. Management plans discussed with the patient and she is in agreement  CODE STATUS: FULL  TOTAL TIME TAKING CARE OF THIS PATIENT: 44 minutes.    Zakariah Dejarnette M.D on 03/10/2018 at 9:59 AM  Between 7am to 6pm - Pager - [phone]  After 6pm go to [email or web address] - Social research officer, government  [facility]  Office  [phone]  CC: Primary care physician; Dione Housekeeper, MD

## 2018-03-10 NOTE — ED Notes (Signed)
Admitting MD at bedside.

## 2018-03-10 NOTE — ED Notes (Signed)
Second unit of o positive emergent blood given after verification from [employer]

## 2018-03-10 NOTE — Consult Note (Signed)
Wyline MoodKiran Blandina Renaldo , MD [address], [address], [place], [place], [address] [address], [place], [place], [address] Phone: [phone]  Fax: [phone]  Consultation  Referring Provider:  Dr Molly PinaMody  Primary Care Physician:  Molly Housekeeperlmedo, Mario Ernesto, MD Primary Gastroenterologist: None        Reason for Consultation:     Rectal bleeding   Date of Admission:  03/10/2018 Date of Consultation:  03/10/2018         HPI:   Molly Parks is a 84 y.o. female presented to the ER early this morning with bloody bowel movements. In the ambulance had a syncopal episode. In the ER had further large bloody bowel movements. CT angiogram in the ER showed no acute bleeding. Diverticulosis of the sigmoid colon noted on admission. Hb 4 months back 11.9 grams, On admission 10.9 grams . Normal BUN/Cr ratio on admission.    Has had two or three bloody bowel movements. None this morning . Denies any prior colonoscopy. Son in room when I spoke to the patient. Denies any abdominal pain.   Past Medical History:  Diagnosis Date  . COPD (chronic obstructive pulmonary disease) (HCC)   . Dislocation, shoulder closed, left, initial encounter   . Hypertension     Past Surgical History:  Procedure Laterality Date  . OOPHORECTOMY Right     Prior to Admission medications   Medication Sig Start Date End Date Taking? Authorizing Provider  aspirin 325 MG EC tablet Take 325 mg daily by mouth.   Yes [provider]  clonazePAM (KLONOPIN) 0.5 MG tablet Take 0.25 mg 2 (two) times daily as needed by mouth.  09/10/17  Yes [provider]  metoprolol tartrate (LOPRESSOR) 25 MG tablet Take 0.5 tablets (12.5 mg total) 2 (two) times daily by mouth. 11/05/17  Yes Shaune Pollackhen, Qing, MD  bisacodyl (DULCOLAX) 5 MG EC tablet Take 1 tablet (5 mg total) daily as needed by mouth for moderate constipation. 11/05/17   Shaune Pollackhen, Qing, MD  docusate sodium (COLACE) 100 MG capsule Take 1 capsule (100 mg total) by mouth 2 (two)  times daily. Patient not taking: Reported on 11/03/2017 08/01/17   Irean HongSung, Jade J, MD  HYDROcodone-acetaminophen (NORCO/VICODIN) 5-325 MG tablet Take 1 tablet every 6 (six) hours as needed by mouth for moderate pain or severe pain. Patient not taking: Reported on 03/10/2018 11/05/17   Shaune Pollackhen, Qing, MD  meclizine (ANTIVERT) 25 MG tablet Take 1 tablet (25 mg total) by mouth 3 (three) times daily as needed for dizziness. Patient not taking: Reported on 11/03/2017 01/07/17   Rockne MenghiniNorman, Anne-Caroline, MD  ondansetron (ZOFRAN ODT) 4 MG disintegrating tablet Take 1 tablet (4 mg total) by mouth every 8 (eight) hours as needed for nausea or vomiting. Patient not taking: Reported on 11/03/2017 08/01/17   Irean HongSung, Jade J, MD    History reviewed. No pertinent family history.   Social History   Tobacco Use  . Smoking status: Never Smoker  . Smokeless tobacco: Never Used  Substance Use Topics  . Alcohol use: No  . Drug use: Not on file    Allergies as of 03/10/2018 - Review Complete 03/10/2018  Allergen Reaction Noted  . Ciprofloxacin  08/01/2017  . Penicillins  04/08/2016    Review of Systems:    All systems reviewed and negative except where noted in HPI.   Physical Exam:  Vital signs in last 24 hours: Temp:  [97.5 F (36.4 C)-97.6 F (36.4 C)] 97.5 F (36.4 C) (03/19 1128) Pulse  Rate:  [72-88] 77 (03/19 1128) Resp:  [14-34] 18 (03/19 1128) BP: (110-167)/(70-94) 152/79 (03/19 1128) SpO2:  [99 %-100 %] 99 % (03/19 1128) Weight:  [142 lb (64.4 kg)] 142 lb (64.4 kg) (03/19 0728)   General:   Pleasant, cooperative in NAD Head:  Normocephalic and atraumatic. Eyes:   No icterus.   Conjunctiva pink. PERRLA. Ears:  Normal auditory acuity. Neck:  Supple; no masses or thyroidomegaly Lungs: Respirations even and unlabored. Lungs clear to auscultation bilaterally.   No wheezes, crackles, or rhonchi.  Heart:  Regular rate and rhythm;  Without murmur, clicks, rubs or gallops Abdomen:  Soft, nondistended,  nontender. Normal bowel sounds. No appreciable masses or hepatomegaly.  No rebound or guarding.  Neurologic:  Alert and oriented x3;  grossly normal neurologically. Skin:  Intact without significant lesions or rashes. Cervical Nodes:  No significant cervical adenopathy. Psych:  Alert and cooperative. Normal affect.  LAB RESULTS: Recent Labs    03/10/18 0734  WBC 8.7  HGB 10.9*  HCT 33.9*  PLT 247   BMET Recent Labs    03/10/18 0817 03/10/18 0833  NA  --  138  K  --  4.0  CL  --  106  CO2  --  25  GLUCOSE  --  147*  BUN  --  15  CREATININE 0.70 0.90  CALCIUM  --  7.7*   LFT Recent Labs    03/10/18 0833  PROT 4.8*  ALBUMIN 2.5*  AST 17  ALT 7*  ALKPHOS 32*  BILITOT 0.5   PT/INR Recent Labs    03/10/18 0833  LABPROT 15.1  INR 1.20    STUDIES: Ct Angio Abd/pel W And/or Wo Contrast  Result Date: 03/10/2018 CLINICAL DATA:  Acute lower GI bleeding EXAM: CTA ABDOMEN AND PELVIS wITHOUT AND WITH CONTRAST TECHNIQUE: Multidetector CT imaging of the abdomen and pelvis was performed using the standard protocol during bolus administration of intravenous contrast. Multiplanar reconstructed images and MIPs were obtained and reviewed to evaluate the vascular anatomy. CONTRAST:  ISOVUE-370 IOPAMIDOL (ISOVUE-370) INJECTION 76% COMPARISON:  None. FINDINGS: VASCULAR GI bleeding: There is no focal contrast extravasation within large bowel during arterial phase. There is no obvious active extravasation of contrast within small bowel or the stomach. Overall, there is no convincing evidence of active gastrointestinal bleeding on this study. Aorta: Nonaneurysmal and patent. Mild atherosclerotic calcifications are present. Celiac: Patent. SMA: Patent. Renals: There is atherosclerotic calcification at the origin of both renal arteries. There is no obvious significant narrowing of the single renal arteries. IMA: Patent and diminutive.  Branch vessels patent. Inflow: There are  atherosclerotic calcifications in the common iliac arteries without significant narrowing. Internal and external iliac arteries are nonaneurysmal and patent. Proximal Outflow: Grossly patent. Veins: There is low-density within the bilateral common femoral veins which may simply represent mixing artifact. DVT is not excluded. Review of the MIP images confirms the above findings. NON-VASCULAR Lower chest: Dependent atelectasis bilaterally. Small right pleural effusion. Hepatobiliary: Diffuse hepatic steatosis.  Postcholecystectomy. Pancreas: Unremarkable. Spleen: Calcified granulomata. Adrenals/Urinary Tract: Chronic changes in the kidneys mild hypertrophy of the adrenal glands in a hyperplasia pattern. Decompressed. Stomach/Bowel: There is no obvious mass in the colon. As stated above, there is no active contrast extravasation in the colon. Appendix is normal. Diverticulosis of the sigmoid colon is present. There is no evidence of small-bowel obstruction. Stomach and duodenum are grossly unremarkable. Lymphatic: No abnormal retroperitoneal adenopathy. Reproductive: Uterus is grossly unremarkable. Adnexa are within normal limits. Other:  Trace free fluid in the right hemipelvis. Musculoskeletal: No vertebral compression deformity. Lumbar degenerative disc disease. IMPRESSION: VASCULAR There is no evidence of active gastrointestinal bleeding. Specifically, there is no evidence of extravasated arterial phase contrast throughout the colon. NON-VASCULAR No acute intra-abdominal pathology. Small right pleural effusion. Electronically Signed   By: Jolaine Click M.D.   On: 03/10/2018 09:09      Impression / Plan:   Molly Parks is a 84 y.o. y/o female with acute rectal bleeding of 1 day duration which was painless , sigmoid diverticulosis seen on CT angiogram with no acute bleeding seen .  Likely a diverticular bleed but there is always a possibility of an undiagnosed colon cancer. Patient said she could not make up her  mind on a colonoscopy , she will discuss with her son and let me know tomorrow if she would like to proceed with a colonoscopy.In the meanwhile if she has further bleeding , obtain a tagged RBC scan .  Thank you for involving me in the care of this patient.      LOS: 0 days   Wyline Mood, MD  03/10/2018, 11:49 AM

## 2018-03-10 NOTE — ED Notes (Signed)
2nd unit emergency blood finished at this time.

## 2018-03-10 NOTE — ED Provider Notes (Signed)
[facility] Emergency Department Provider Note  ____________________________________________  Time seen: Approximately 7:37 AM  I have reviewed the triage vital signs and the nursing notes.   HISTORY  Chief Complaint GI Bleeding   HPI ROSELLA CRANDELL is a 84 y.o. female with a history of CVA, hypertension, DVT, hyperlipidemia, COPD who presents for evaluation of GI bleed. Patient reports that she started having bloody bowel movements this morning. She is unable to tell me how many she had. EMS reports that while moving patient into the ambulance she had a brief episode of LOC which resolved when she was laid flat. She had a large bloody stool in route. Patient denies dizziness, chest pain, shortness of breath, abdominal pain, prior history of GI bleed. No nausea or vomiting. She is not on blood thinners.  Chief Complaint: bloody stool Quality: bright red blood and clots Severity: severe Duration: since this am Context: not on blood thinners Associated signs/symptoms: dizziness and a syncopal event  Past Medical History:  Diagnosis Date  . COPD (chronic obstructive pulmonary disease) (HCC)   . Dislocation, shoulder closed, left, initial encounter   . Hypertension     Patient Active Problem List   Diagnosis Date Noted  . GIB (gastrointestinal bleeding) 03/10/2018  . Rhabdomyolysis 11/03/2017    Past Surgical History:  Procedure Laterality Date  . OOPHORECTOMY Right     Prior to Admission medications   Medication Sig Start Date End Date Taking? Authorizing Provider  aspirin 325 MG EC tablet Take 325 mg daily by mouth.   Yes [provider]  clonazePAM (KLONOPIN) 0.5 MG tablet Take 0.25 mg 2 (two) times daily as needed by mouth.  09/10/17  Yes [provider]  metoprolol tartrate (LOPRESSOR) 25 MG tablet Take 0.5 tablets (12.5 mg total) 2 (two) times daily by mouth. 11/05/17  Yes Shaune Pollack, MD  bisacodyl (DULCOLAX) 5 MG EC tablet  Take 1 tablet (5 mg total) daily as needed by mouth for moderate constipation. 11/05/17   Shaune Pollack, MD  docusate sodium (COLACE) 100 MG capsule Take 1 capsule (100 mg total) by mouth 2 (two) times daily. Patient not taking: Reported on 11/03/2017 08/01/17   Irean Hong, MD  HYDROcodone-acetaminophen (NORCO/VICODIN) 5-325 MG tablet Take 1 tablet every 6 (six) hours as needed by mouth for moderate pain or severe pain. Patient not taking: Reported on 03/10/2018 11/05/17   Shaune Pollack, MD  meclizine (ANTIVERT) 25 MG tablet Take 1 tablet (25 mg total) by mouth 3 (three) times daily as needed for dizziness. Patient not taking: Reported on 11/03/2017 01/07/17   Rockne Menghini, MD  ondansetron (ZOFRAN ODT) 4 MG disintegrating tablet Take 1 tablet (4 mg total) by mouth every 8 (eight) hours as needed for nausea or vomiting. Patient not taking: Reported on 11/03/2017 08/01/17   Irean Hong, MD    Allergies Ciprofloxacin and Penicillins  History reviewed. No pertinent family history.  Social History Social History   Tobacco Use  . Smoking status: Never Smoker  . Smokeless tobacco: Never Used  Substance Use Topics  . Alcohol use: No  . Drug use: Not on file    Review of Systems  Constitutional: Negative for fever. Eyes: Negative for visual changes. ENT: Negative for sore throat. Neck: No neck pain  Cardiovascular: Negative for chest pain. Respiratory: Negative for shortness of breath. Gastrointestinal: Negative for abdominal pain, vomiting. + rectal bleeding Genitourinary: Negative for dysuria. Musculoskeletal: Negative for back pain. Skin: Negative for rash. Neurological:  Negative for headaches, weakness or numbness. Psych: No SI or HI  ____________________________________________   PHYSICAL EXAM:  VITAL SIGNS: ED Triage Vitals  Enc Vitals Group     BP 03/10/18 0727 (!) 112/94     Pulse Rate 03/10/18 0727 87     Resp 03/10/18 0727 (!) 33     Temp 03/10/18 0727 97.6 F  (36.4 C)     Temp Source 03/10/18 0727 Axillary     SpO2 03/10/18 0727 100 %     Weight 03/10/18 0728 142 lb (64.4 kg)     Height 03/10/18 0728 5\' 9"  (1.753 m)     Head Circumference --      Peak Flow --      Pain Score --      Pain Loc --      Pain Edu? --      Excl. in GC? --     Constitutional: Alert and oriented, no apparent distress, patient is covered in blood which is actively coming from rectum HEENT:      Head: Normocephalic and atraumatic.         Eyes: Conjunctivae are normal. Sclera is non-icteric.       Mouth/Throat: Mucous membranes are moist.       Neck: Supple with no signs of meningismus. Cardiovascular: Regular rate and rhythm. No murmurs, gallops, or rubs. 2+ symmetrical distal pulses are present in all extremities. No JVD. Respiratory: Normal respiratory effort. Lungs are clear to auscultation bilaterally. No wheezes, crackles, or rhonchi.  Gastrointestinal: Soft, non tender, and non distended with positive bowel sounds. No rebound or guarding. Genitourinary: Patient covered in blood which is actively coming from her rectum mixed with stool and blood clots Musculoskeletal: Nontender with normal range of motion in all extremities. No edema, cyanosis, or erythema of extremities. Neurologic: Normal speech and language. Face is symmetric. Moving all extremities. No gross focal neurologic deficits are appreciated. Skin: Skin is warm, dry and intact. No rash noted. Psychiatric: Mood and affect are normal. Speech and behavior are normal.  ____________________________________________   LABS (all labs ordered are listed, but only abnormal results are displayed)  Labs Reviewed  CBC WITH DIFFERENTIAL/PLATELET - Abnormal; Notable for the following components:      Result Value   Hemoglobin 10.9 (*)    HCT 33.9 (*)    RDW 16.7 (*)    Lymphs Abs 4.1 (*)    All other components within normal limits  COMPREHENSIVE METABOLIC PANEL - Abnormal; Notable for the following  components:   Glucose, Bld 147 (*)    Calcium 7.7 (*)    Total Protein 4.8 (*)    Albumin 2.5 (*)    ALT 7 (*)    Alkaline Phosphatase 32 (*)    GFR calc non Af Amer 56 (*)    All other components within normal limits  LACTIC ACID, PLASMA - Abnormal; Notable for the following components:   Lactic Acid, Venous 2.1 (*)    All other components within normal limits  LACTIC ACID, PLASMA  PROTIME-INR  APTT  POCT I-STAT CREATININE  PREPARE RBC (CROSSMATCH)  TYPE AND SCREEN  ABO/RH   ____________________________________________  EKG  ED ECG REPORT I, Nita Sickle, the attending physician, personally viewed and interpreted this ECG.  Normal sinus rhythm, rate of 86, normal intervals, right axis deviation, no ST elevations or depressions. no significant changes when compared to prior from 11/18 ____________________________________________  RADIOLOGY  I have personally reviewed the images performed during this visit  and I agree with the Radiologist's read.   Interpretation by Radiologist:  Ct Angio Abd/pel W And/or Wo Contrast  Result Date: 03/10/2018 CLINICAL DATA:  Acute lower GI bleeding EXAM: CTA ABDOMEN AND PELVIS wITHOUT AND WITH CONTRAST TECHNIQUE: Multidetector CT imaging of the abdomen and pelvis was performed using the standard protocol during bolus administration of intravenous contrast. Multiplanar reconstructed images and MIPs were obtained and reviewed to evaluate the vascular anatomy. CONTRAST:  100mL ISOVUE-370 IOPAMIDOL (ISOVUE-370) INJECTION 76% COMPARISON:  None. FINDINGS: VASCULAR GI bleeding: There is no focal contrast extravasation within large bowel during arterial phase. There is no obvious active extravasation of contrast within small bowel or the stomach. Overall, there is no convincing evidence of active gastrointestinal bleeding on this study. Aorta: Nonaneurysmal and patent. Mild atherosclerotic calcifications are present. Celiac: Patent. SMA: Patent.  Renals: There is atherosclerotic calcification at the origin of both renal arteries. There is no obvious significant narrowing of the single renal arteries. IMA: Patent and diminutive.  Branch vessels patent. Inflow: There are atherosclerotic calcifications in the common iliac arteries without significant narrowing. Internal and external iliac arteries are nonaneurysmal and patent. Proximal Outflow: Grossly patent. Veins: There is low-density within the bilateral common femoral veins which may simply represent mixing artifact. DVT is not excluded. Review of the MIP images confirms the above findings. NON-VASCULAR Lower chest: Dependent atelectasis bilaterally. Small right pleural effusion. Hepatobiliary: Diffuse hepatic steatosis.  Postcholecystectomy. Pancreas: Unremarkable. Spleen: Calcified granulomata. Adrenals/Urinary Tract: Chronic changes in the kidneys mild hypertrophy of the adrenal glands in a hyperplasia pattern. Decompressed. Stomach/Bowel: There is no obvious mass in the colon. As stated above, there is no active contrast extravasation in the colon. Appendix is normal. Diverticulosis of the sigmoid colon is present. There is no evidence of small-bowel obstruction. Stomach and duodenum are grossly unremarkable. Lymphatic: No abnormal retroperitoneal adenopathy. Reproductive: Uterus is grossly unremarkable. Adnexa are within normal limits. Other: Trace free fluid in the right hemipelvis. Musculoskeletal: No vertebral compression deformity. Lumbar degenerative disc disease. IMPRESSION: VASCULAR There is no evidence of active gastrointestinal bleeding. Specifically, there is no evidence of extravasated arterial phase contrast throughout the colon. NON-VASCULAR No acute intra-abdominal pathology. Small right pleural effusion. Electronically Signed   By: Jolaine ClickArthur  Hoss M.D.   On: 03/10/2018 09:09      ____________________________________________   PROCEDURES  Procedure(s) performed:  None Procedures Critical Care performed: YES  CRITICAL CARE Performed by: Nita Sicklearolina Naidelin Gugliotta  ?  Total critical care time: 45 min  Critical care time was exclusive of separately billable procedures and treating other patients.  Critical care was necessary to treat or prevent imminent or life-threatening deterioration.  Critical care was time spent personally by me on the following activities: development of treatment plan with patient and/or surrogate as well as nursing, discussions with consultants, evaluation of patient's response to treatment, examination of patient, obtaining history from patient or surrogate, ordering and performing treatments and interventions, ordering and review of laboratory studies, ordering and review of radiographic studies, pulse oximetry and re-evaluation of patient's condition.  ____________________________________________   INITIAL IMPRESSION / ASSESSMENT AND PLAN / ED COURSE   84 y.o. female with a history of CVA, hypertension, DVT, hyperlipidemia, COPD who presents for evaluation of GI bleed. patient arrives with good blood pressure and normal heart rate, actively hemorrhaging from her rectum with large blood clots. Normal mental status. EKG with no ischemic changes. Abdomen soft with no tenderness. Patient started on 2 U of emergent release blood. Labs, coags, lactic pending. Discussed  with interventional radiology for angiography and embolization. Radiologist recommended a CTA of the abdomen and pelvis.    ED COURSE: CTA negative for active bleeding. Rectal bleeding slowed down after transfusion. Patient remained stable in the ED. TXA not given since patient has had a stroke in the past. Patient was admitted for further management.   As part of my medical decision making, I reviewed the following data within the electronic MEDICAL RECORD NUMBER Nursing notes reviewed and incorporated, Labs reviewed , EKG interpreted , Old EKG reviewed, Old chart reviewed,  Radiograph reviewed , Discussed with admitting physician , Notes from prior ED visits and [place] Controlled Substance Database, Consult placed for IR    Pertinent labs & imaging results that were available during my care of the patient were reviewed by me and considered in my medical decision making (see chart for details).    ____________________________________________   FINAL CLINICAL IMPRESSION(S) / ED DIAGNOSES  Final diagnoses:  Lower GI bleed  Hemorrhage      NEW MEDICATIONS STARTED DURING THIS VISIT:  ED Discharge Orders    None       Note:  This document was prepared using Dragon voice recognition software and may include unintentional dictation errors.    Don Perking, [place], MD 03/10/18 [phone]

## 2018-03-10 NOTE — ED Triage Notes (Signed)
Pt arrived via ems from home with active lower gi bleeding. Pt lost consciousness in route. Upon arrival to ED pt alert.

## 2018-03-10 NOTE — ED Notes (Signed)
First unit of emergent blood complete

## 2018-03-10 NOTE — ED Notes (Signed)
1st unit of emergency release blood started at this time, verified by Nelly LaurenceAshley S, RN and Aundra MilletMegan, RN.

## 2018-03-10 NOTE — ED Notes (Signed)
Pt transported to CT with Rn Aundra MilletMegan

## 2018-03-10 NOTE — ED Notes (Signed)
Nurse unavailable to take report at this time.  

## 2018-03-10 NOTE — Progress Notes (Signed)
Family Meeting Note  Advance Directive:yes  Today a meeting took place with the Patient and her son who is power of attorney and caretaker. The following clinical team members were present during this meeting:MD  The following were discussed:Patient's diagnosis: Rectal bleeding with acute blood loss anemia requiring 2 unit PRBCs History of COPD History of hypertension  patient's progosis: < 12 months and Goals for treatment: Full Code  Additional follow-up to be provided: Patient is full CODE STATUS.  Advance directives are in place.  Son does not wish to change CODE STATUS and no update needed on advanced directives.  Time spent during discussion: 16 minutes  Niles Ess, MD

## 2018-03-11 DIAGNOSIS — K5731 Diverticulosis of large intestine without perforation or abscess with bleeding: Secondary | ICD-10-CM | POA: Diagnosis not present

## 2018-03-11 DIAGNOSIS — K922 Gastrointestinal hemorrhage, unspecified: Secondary | ICD-10-CM | POA: Diagnosis not present

## 2018-03-11 LAB — CBC
HEMATOCRIT: 35.1 % (ref 35.0–47.0)
HEMOGLOBIN: 11.5 g/dL — AB (ref 12.0–16.0)
MCH: 27.8 pg (ref 26.0–34.0)
MCHC: 32.9 g/dL (ref 32.0–36.0)
MCV: 84.7 fL (ref 80.0–100.0)
Platelets: 177 10*3/uL (ref 150–440)
RBC: 4.14 MIL/uL (ref 3.80–5.20)
RDW: 16.7 % — AB (ref 11.5–14.5)
WBC: 7.4 10*3/uL (ref 3.6–11.0)

## 2018-03-11 LAB — BASIC METABOLIC PANEL
ANION GAP: 6 (ref 5–15)
BUN: 18 mg/dL (ref 6–20)
CO2: 26 mmol/L (ref 22–32)
Calcium: 8.6 mg/dL — ABNORMAL LOW (ref 8.9–10.3)
Chloride: 108 mmol/L (ref 101–111)
Creatinine, Ser: 0.61 mg/dL (ref 0.44–1.00)
GFR calc non Af Amer: >60 mL/min (ref >60)
Glucose, Bld: 91 mg/dL (ref 65–99)
POTASSIUM: 4.1 mmol/L (ref 3.5–5.1)
SODIUM: 140 mmol/L (ref 135–145)

## 2018-03-11 MED ORDER — POLYETHYLENE GLYCOL 3350 17 G PO PACK: 17.0000 g | PACK | Freq: Every day | ORAL | 0 refills | Status: DC | PRN

## 2018-03-11 MED ORDER — CLONAZEPAM 0.5 MG PO TABS: 0.2500 mg | ORAL_TABLET | Freq: Two times a day (BID) | ORAL | 0 refills | Status: DC | PRN

## 2018-03-11 NOTE — Care Management Note (Signed)
Case Management Note  Patient Details  Name: Molly Parks MRN: [id number] Date of Birth: 08/15/1928   Patient was discharged home today.  Lives at home with son.  PCP [facility].  Patient has a cane and WC in the home.  Patient is agreeable to home health services.  She states "just don't let it be [facility] or [facility], anyone else is fine".  [facility]  Subjective/Objective:                    Action/Plan:   Expected Discharge Date:  03/11/18               Expected Discharge Plan:  Home w Home Health Services  In-House Referral:     Discharge planning Services  CM Consult  Post Acute Care Choice:    Choice offered to:  Patient, Adult Children  DME Arranged:    DME Agency:     HH Arranged:  RN, PT, Nurse's Aide HH Agency:  [facility]  Status of Service:  Completed, signed off  If discussed at Long Length of Stay Meetings, dates discussed:    Additional Comments:  Chapman FitchBOWEN, Jahara Dail T, RN 03/11/2018, 3:47 PM

## 2018-03-11 NOTE — Care Management Important Message (Signed)
Important Message  Patient Details  Name: Molly Parks MRN: [id number] Date of Birth: 03/10/1928   Medicare Important Message Given:  N/A - LOS <3 / Initial given by admissions    Chapman FitchBOWEN, Hao Dion T, RN 03/11/2018, 10:27 AM

## 2018-03-11 NOTE — Discharge Summary (Signed)
[facility] [place] [facility]   PATIENT NAME: Molly Parks    MR#:  [id number]  DATE OF BIRTH:  1928/02/22  DATE OF ADMISSION:  03/10/2018 ADMITTING PHYSICIAN: Adrian Saran, MD  DATE OF DISCHARGE: 03/11/2018  PRIMARY CARE PHYSICIAN: Dione Housekeeper, MD    ADMISSION DIAGNOSIS:  Hemorrhage [R58] Lower GI bleed [K92.2]  DISCHARGE DIAGNOSIS:  Active Problems:   GIB (gastrointestinal bleeding)   SECONDARY DIAGNOSIS:   Past Medical History:  Diagnosis Date  . COPD (chronic obstructive pulmonary disease) (HCC)   . Dislocation, shoulder closed, left, initial encounter   . Hypertension     HOSPITAL COURSE:   84 year old female with history of COPD and essential hypertension who presented with rectal bleeding.  1.  Lower GI bleed: Diverticular in nature. Patient was evaluated by GI.  She did not want colonoscopy.  She had no rectal bleeding while in the hospital. Her hemoglobin is stable.  2.  Acute blood loss anemia status post 2 units PRBC Hemoglobin is stable  3.  COPD exacerbation without signs of exacerbation  4. Essential hypertensionContinue metoprolol       DISCHARGE CONDITIONS AND DIET:   Stable Regular foet  CONSULTS OBTAINED:  Treatment Team:  Wyline Mood, MD  DRUG ALLERGIES:   Allergies  Allergen Reactions  . Ciprofloxacin   . Penicillins     Has patient had a PCN reaction causing immediate rash, facial/tongue/throat swelling, SOB or lightheadedness with hypotension: Unknown Has patient had a PCN reaction causing severe rash involving mucus membranes or skin necrosis: Unknown Has patient had a PCN reaction that required hospitalization: Unknown Has patient had a PCN reaction occurring within the last 10 years: Unknown If all of the above answers are "NO", then may proceed with Cephalosporin use.    DISCHARGE MEDICATIONS:   Allergies as of 03/11/2018      Reactions   Ciprofloxacin    Penicillins    Has patient had a  PCN reaction causing immediate rash, facial/tongue/throat swelling, SOB or lightheadedness with hypotension: Unknown Has patient had a PCN reaction causing severe rash involving mucus membranes or skin necrosis: Unknown Has patient had a PCN reaction that required hospitalization: Unknown Has patient had a PCN reaction occurring within the last 10 years: Unknown If all of the above answers are "NO", then may proceed with Cephalosporin use.      Medication List    STOP taking these medications   docusate sodium 100 MG capsule Commonly known as:  COLACE   HYDROcodone-acetaminophen 5-325 MG tablet Commonly known as:  NORCO/VICODIN   meclizine 25 MG tablet Commonly known as:  ANTIVERT   ondansetron 4 MG disintegrating tablet Commonly known as:  ZOFRAN ODT     TAKE these medications   aspirin 325 MG EC tablet Take 325 mg daily by mouth.   bisacodyl 5 MG EC tablet Commonly known as:  DULCOLAX Take 1 tablet (5 mg total) daily as needed by mouth for moderate constipation.   clonazePAM 0.5 MG tablet Commonly known as:  KLONOPIN Take 0.5 tablets (0.25 mg total) by mouth 2 (two) times daily as needed.   metoprolol tartrate 25 MG tablet Commonly known as:  LOPRESSOR Take 0.5 tablets (12.5 mg total) 2 (two) times daily by mouth.   polyethylene glycol packet Commonly known as:  MIRALAX / GLYCOLAX Take 17 g by mouth daily as needed for mild constipation.         Today   CHIEF COMPLAINT:  No rectal bleed  did not want colonoscopy.   VITAL SIGNS:  Blood pressure 122/62, pulse 81, temperature 98.7 F (37.1 C), temperature source Oral, resp. rate 18, height 5\' 9"  (1.753 m), weight 64.4 kg (142 lb), SpO2 100 %.   REVIEW OF SYSTEMS:  Review of Systems  Constitutional: Negative.  Negative for chills, fever and malaise/fatigue.  HENT: Negative.  Negative for ear discharge, ear pain, hearing loss, nosebleeds and sore throat.   Eyes: Negative.  Negative for blurred vision and  pain.  Respiratory: Negative.  Negative for cough, hemoptysis, shortness of breath and wheezing.   Cardiovascular: Negative.  Negative for chest pain, palpitations and leg swelling.  Gastrointestinal: Negative.  Negative for abdominal pain, blood in stool, diarrhea, nausea and vomiting.  Genitourinary: Negative.  Negative for dysuria.  Musculoskeletal: Negative.  Negative for back pain.  Skin: Negative.   Neurological: Negative for dizziness, tremors, speech change, focal weakness, seizures and headaches.  Endo/Heme/Allergies: Negative.  Does not bruise/bleed easily.  Psychiatric/Behavioral: Negative.  Negative for depression, hallucinations and suicidal ideas.     PHYSICAL EXAMINATION:  GENERAL:  84 y.o.-year-old patient lying in the bed with no acute distress.  NECK:  Supple, no jugular venous distention. No thyroid enlargement, no tenderness.  LUNGS: Normal breath sounds bilaterally, no wheezing, rales,rhonchi  No use of accessory muscles of respiration.  CARDIOVASCULAR: S1, S2 normal. No murmurs, rubs, or gallops.  ABDOMEN: Soft, non-tender, non-distended. Bowel sounds present. No organomegaly or mass.  EXTREMITIES: No pedal edema, cyanosis, or clubbing.  PSYCHIATRIC: The patient is alert and oriented x 3.  SKIN: No obvious rash, lesion, or ulcer.   DATA REVIEW:   CBC Recent Labs  Lab 03/11/18 0700  WBC 7.4  HGB 11.5*  HCT 35.1  PLT 177    Chemistries  Recent Labs  Lab 03/10/18 0833 03/11/18 0700  NA 138 140  K 4.0 4.1  CL 106 108  CO2 25 26  GLUCOSE 147* 91  BUN 15 18  CREATININE 0.90 0.61  CALCIUM 7.7* 8.6*  AST 17  --   ALT 7*  --   ALKPHOS 32*  --   BILITOT 0.5  --     Cardiac Enzymes No results for input(s): TROPONINI in the last 168 hours.  Microbiology Results  @MICRORSLT48 @  RADIOLOGY:  Ct Angio Abd/pel W And/or Wo Contrast  Result Date: 03/10/2018 CLINICAL DATA:  Acute lower GI bleeding EXAM: CTA ABDOMEN AND PELVIS wITHOUT AND WITH CONTRAST  TECHNIQUE: Multidetector CT imaging of the abdomen and pelvis was performed using the standard protocol during bolus administration of intravenous contrast. Multiplanar reconstructed images and MIPs were obtained and reviewed to evaluate the vascular anatomy. CONTRAST:  ISOVUE-370 IOPAMIDOL (ISOVUE-370) INJECTION 76% COMPARISON:  None. FINDINGS: VASCULAR GI bleeding: There is no focal contrast extravasation within large bowel during arterial phase. There is no obvious active extravasation of contrast within small bowel or the stomach. Overall, there is no convincing evidence of active gastrointestinal bleeding on this study. Aorta: Nonaneurysmal and patent. Mild atherosclerotic calcifications are present. Celiac: Patent. SMA: Patent. Renals: There is atherosclerotic calcification at the origin of both renal arteries. There is no obvious significant narrowing of the single renal arteries. IMA: Patent and diminutive.  Branch vessels patent. Inflow: There are atherosclerotic calcifications in the common iliac arteries without significant narrowing. Internal and external iliac arteries are nonaneurysmal and patent. Proximal Outflow: Grossly patent. Veins: There is low-density within the bilateral common femoral veins which may simply represent mixing artifact. DVT is not excluded. Review  of the MIP images confirms the above findings. NON-VASCULAR Lower chest: Dependent atelectasis bilaterally. Small right pleural effusion. Hepatobiliary: Diffuse hepatic steatosis.  Postcholecystectomy. Pancreas: Unremarkable. Spleen: Calcified granulomata. Adrenals/Urinary Tract: Chronic changes in the kidneys mild hypertrophy of the adrenal glands in a hyperplasia pattern. Decompressed. Stomach/Bowel: There is no obvious mass in the colon. As stated above, there is no active contrast extravasation in the colon. Appendix is normal. Diverticulosis of the sigmoid colon is present. There is no evidence of small-bowel obstruction.  Stomach and duodenum are grossly unremarkable. Lymphatic: No abnormal retroperitoneal adenopathy. Reproductive: Uterus is grossly unremarkable. Adnexa are within normal limits. Other: Trace free fluid in the right hemipelvis. Musculoskeletal: No vertebral compression deformity. Lumbar degenerative disc disease. IMPRESSION: VASCULAR There is no evidence of active gastrointestinal bleeding. Specifically, there is no evidence of extravasated arterial phase contrast throughout the colon. NON-VASCULAR No acute intra-abdominal pathology. Small right pleural effusion. Electronically Signed   By: Jolaine Click M.D.   On: 03/10/2018 09:09      Allergies as of 03/11/2018      Reactions   Ciprofloxacin    Penicillins    Has patient had a PCN reaction causing immediate rash, facial/tongue/throat swelling, SOB or lightheadedness with hypotension: Unknown Has patient had a PCN reaction causing severe rash involving mucus membranes or skin necrosis: Unknown Has patient had a PCN reaction that required hospitalization: Unknown Has patient had a PCN reaction occurring within the last 10 years: Unknown If all of the above answers are "NO", then may proceed with Cephalosporin use.      Medication List    STOP taking these medications   docusate sodium 100 MG capsule Commonly known as:  COLACE   HYDROcodone-acetaminophen 5-325 MG tablet Commonly known as:  NORCO/VICODIN   meclizine 25 MG tablet Commonly known as:  ANTIVERT   ondansetron 4 MG disintegrating tablet Commonly known as:  ZOFRAN ODT     TAKE these medications   aspirin 325 MG EC tablet Take 325 mg daily by mouth.   bisacodyl 5 MG EC tablet Commonly known as:  DULCOLAX Take 1 tablet (5 mg total) daily as needed by mouth for moderate constipation.   clonazePAM 0.5 MG tablet Commonly known as:  KLONOPIN Take 0.5 tablets (0.25 mg total) by mouth 2 (two) times daily as needed.   metoprolol tartrate 25 MG tablet Commonly known as:   LOPRESSOR Take 0.5 tablets (12.5 mg total) 2 (two) times daily by mouth.   polyethylene glycol packet Commonly known as:  MIRALAX / GLYCOLAX Take 17 g by mouth daily as needed for mild constipation.          Management plans discussed with the patient and she is in agreement. Stable for discharge home  Patient should follow up with pcp  CODE STATUS:     Code Status Orders  (From admission, onward)        Start     Ordered   03/10/18 1123  Full code  Continuous     03/10/18 1122    Code Status History    Date Active Date Inactive Code Status Order ID Comments User Context   11/03/2017 22:35 11/05/2017 19:20 Full Code [id number]  Arnaldo Natal, MD Inpatient    Advance Directive Documentation     Most Recent Value  Type of Advance Directive  Living will  Pre-existing out of facility DNR order (yellow form or pink MOST form)  No data  "MOST" Form in Place?  No data  TOTAL TIME TAKING CARE OF THIS PATIENT: 38 minutes.    Note: This dictation was prepared with Dragon dictation along with smaller phrase technology. Any transcriptional errors that result from this process are unintentional.  Krina Mraz M.D on 03/11/2018 at 10:18 AM  Between 7am to 6pm - Pager - [phone] After 6pm go to [email or web address] - Social research officer, governmentpassword [email or web address]  [facility]  Office  [phone]  CC: Primary care physician; Dione Housekeeperlmedo, Mario Ernesto, MD

## 2018-03-11 NOTE — Progress Notes (Signed)
Molly Parks to be D/C'd Home per MD order.  Discussed prescriptions and follow up appointments with the patient. Prescriptions given to patient, medication list explained in detail. Pt verbalized understanding. Molly Parks discussed home health with patient and son.  Allergies as of 03/11/2018      Reactions   Ciprofloxacin    Penicillins    Has patient had a PCN reaction causing immediate rash, facial/tongue/throat swelling, SOB or lightheadedness with hypotension: Unknown Has patient had a PCN reaction causing severe rash involving mucus membranes or skin necrosis: Unknown Has patient had a PCN reaction that required hospitalization: Unknown Has patient had a PCN reaction occurring within the last 10 years: Unknown If all of the above answers are "NO", then may proceed with Cephalosporin use.      Medication List    STOP taking these medications   docusate sodium 100 MG capsule Commonly known as:  COLACE   HYDROcodone-acetaminophen 5-325 MG tablet Commonly known as:  NORCO/VICODIN   meclizine 25 MG tablet Commonly known as:  ANTIVERT   ondansetron 4 MG disintegrating tablet Commonly known as:  ZOFRAN ODT     TAKE these medications   aspirin 325 MG EC tablet Take 325 mg daily by mouth.   bisacodyl 5 MG EC tablet Commonly known as:  DULCOLAX Take 1 tablet (5 mg total) daily as needed by mouth for moderate constipation.   clonazePAM 0.5 MG tablet Commonly known as:  KLONOPIN Take 0.5 tablets (0.25 mg total) by mouth 2 (two) times daily as needed.   metoprolol tartrate 25 MG tablet Commonly known as:  LOPRESSOR Take 0.5 tablets (12.5 mg total) 2 (two) times daily by mouth.   polyethylene glycol packet Commonly known as:  MIRALAX / GLYCOLAX Take 17 g by mouth daily as needed for mild constipation.       Vitals:   03/11/18 0455 03/11/18 0920  BP: (!) 112/55 122/62  Pulse: 77 81  Resp: 18   Temp: 98.7 F (37.1 C)   SpO2: 100%     Skin clean, dry and intact  without evidence of skin break down, no evidence of skin tears noted. IV catheter discontinued intact. Site without signs and symptoms of complications. Dressing and pressure applied. Pt denies pain at this time. No complaints noted.  An After Visit Summary was printed and given to the patient. Patient escorted via WC, and D/C home via private auto.  Molly Parks Molly Parks

## 2018-03-12 LAB — BPAM RBC
BLOOD PRODUCT EXPIRATION DATE: 201904022359
BLOOD PRODUCT EXPIRATION DATE: 201904052359
BLOOD PRODUCT EXPIRATION DATE: 201904082359
Blood Product Expiration Date: 201904082359
ISSUE DATE / TIME: 201903190727
ISSUE DATE / TIME: 201903190727
UNIT TYPE AND RH: 7300
Unit Type and Rh: 5100
Unit Type and Rh: 5100
Unit Type and Rh: 7300

## 2018-03-12 LAB — TYPE AND SCREEN
ABO/RH(D): B POS
ANTIBODY SCREEN: NEGATIVE
UNIT DIVISION: 0
Unit division: 0
Unit division: 0
Unit division: 0

## 2018-03-12 LAB — PREPARE RBC (CROSSMATCH)

## 2019-01-02 ENCOUNTER — Other Ambulatory Visit: Payer: Self-pay

## 2019-01-02 ENCOUNTER — Encounter: Payer: Self-pay | Admitting: Emergency Medicine

## 2019-01-02 ENCOUNTER — Emergency Department
Admission: EM | Admit: 2019-01-02 | Discharge: 2019-01-02 | Disposition: A | Discharge status: Home / Self Care | Payer: Medicare Other | Attending: Emergency Medicine | Admitting: Emergency Medicine

## 2019-01-02 DIAGNOSIS — I10 Essential (primary) hypertension: Secondary | ICD-10-CM

## 2019-01-02 DIAGNOSIS — Z7982 Long term (current) use of aspirin: Secondary | ICD-10-CM | POA: Insufficient documentation

## 2019-01-02 DIAGNOSIS — Z79899 Other long term (current) drug therapy: Secondary | ICD-10-CM | POA: Diagnosis not present

## 2019-01-02 DIAGNOSIS — J449 Chronic obstructive pulmonary disease, unspecified: Secondary | ICD-10-CM | POA: Diagnosis not present

## 2019-01-02 MED ORDER — AMLODIPINE BESYLATE 5 MG PO TABS: 5.0000 mg | ORAL_TABLET | Freq: Every day | ORAL | 0 refills | Status: DC

## 2019-01-02 MED ORDER — AMLODIPINE BESYLATE 5 MG PO TABS
5.0000 mg | ORAL_TABLET | Freq: Once | ORAL | Status: AC
  Administered 2019-01-02: 5 mg via ORAL
  Filled 2019-01-02: qty 1

## 2019-01-02 NOTE — ED Notes (Signed)
Pt denies headache, dizziness, or any other neurologic symptoms at this time. Pt denies abdominal pain. Pt's only complaints at this time is nausea. Pt in NAD at this time. This RN will continue to monitor.

## 2019-01-02 NOTE — ED Provider Notes (Signed)
[facility] Emergency Department Provider Note  ____________________________________________  Time seen: Approximately 9:56 PM  I have reviewed the triage vital signs and the nursing notes.   HISTORY  Chief Complaint Hypertension    HPI Molly Parks is a 85 y.o. female with a history of COPD and hypertension who comes to the ED complaining of high blood pressure.  She reports she was in her usual state of health until she ate [place] fried chicken tonight with dinner.  She felt like the potatoes were particularly salty, and after that she felt like she had a little bit of a stomach upset.  She feels nauseated but no vomiting.  No diarrhea or constipation, had a normal bowel movement today.  Denies any pain such as abdominal pain chest pain shortness of breath or back pain.  She is been checking her blood pressure today and found that it is been about 180/100.  Usually it is around 130 systolic for her.  Has not been taking any decongestants recently or other over-the-counter medicines.  Per electronic medical record her only antihypertensive is metoprolol.      Past Medical History:  Diagnosis Date  . COPD (chronic obstructive pulmonary disease) (HCC)   . Dislocation, shoulder closed, left, initial encounter   . Hypertension      Patient Active Problem List   Diagnosis Date Noted  . GIB (gastrointestinal bleeding) 03/10/2018  . Rhabdomyolysis 11/03/2017     Past Surgical History:  Procedure Laterality Date  . OOPHORECTOMY Right      Prior to Admission medications   Medication Sig Start Date End Date Taking? Authorizing Provider  aspirin 325 MG EC tablet Take 325 mg daily by mouth.    [provider]  bisacodyl (DULCOLAX) 5 MG EC tablet Take 1 tablet (5 mg total) daily as needed by mouth for moderate constipation. 11/05/17   Shaune Pollack, MD  clonazePAM (KLONOPIN) 0.5 MG tablet Take 0.5 tablets (0.25 mg total) by mouth 2 (two) times  daily as needed. 03/11/18   Adrian Saran, MD  metoprolol tartrate (LOPRESSOR) 25 MG tablet Take 0.5 tablets (12.5 mg total) 2 (two) times daily by mouth. 11/05/17   Shaune Pollack, MD  polyethylene glycol [facility] Ethelene Hal) packet Take 17 g by mouth daily as needed for mild constipation. 03/11/18   Adrian Saran, MD     Allergies Ciprofloxacin and Penicillins   History reviewed. No pertinent family history.  Social History Social History   Tobacco Use  . Smoking status: Never Smoker  . Smokeless tobacco: Never Used  Substance Use Topics  . Alcohol use: No  . Drug use: Not on file    Review of Systems  Constitutional:   No fever or chills.  ENT:   No sore throat.  Positive rhinorrhea. Cardiovascular:   No chest pain or syncope. Respiratory:   No dyspnea or cough. Gastrointestinal:   Negative for abdominal pain, vomiting and diarrhea.  Musculoskeletal:   Negative for focal pain or swelling All other systems reviewed and are negative except as documented above in ROS and HPI.  ____________________________________________   PHYSICAL EXAM:  VITAL SIGNS: ED Triage Vitals  Enc Vitals Group     BP 01/02/19 2130 (!) 194/94     Pulse Rate 01/02/19 2130 66     Resp 01/02/19 2130 18     Temp 01/02/19 2132 98.1 F (36.7 C)     Temp Source 01/02/19 2132 Oral     SpO2 01/02/19 2130 99 %  Weight 01/02/19 2133 118 lb (53.5 kg)     Height 01/02/19 2133 5\' 9"  (1.753 m)     Head Circumference --      Peak Flow --      Pain Score 01/02/19 2133 0     Pain Loc --      Pain Edu? --      Excl. in GC? --     Vital signs reviewed, nursing assessments reviewed.   Constitutional:   Alert and oriented. Non-toxic appearance. Eyes:   Conjunctivae are normal. EOMI. PERRL. ENT      Head:   Normocephalic and atraumatic.      Nose:   No congestion/rhinnorhea.       Mouth/Throat:   MMM, no pharyngeal erythema. No peritonsillar mass.       Neck:   No meningismus. Full  ROM. Hematological/Lymphatic/Immunilogical:   No cervical lymphadenopathy. Cardiovascular:   RRR. Symmetric bilateral radial and DP pulses.  No murmurs. Cap refill less than 2 seconds. Respiratory:   Normal respiratory effort without tachypnea/retractions. Breath sounds are clear and equal bilaterally. No wheezes/rales/rhonchi. Gastrointestinal:   Soft and nontender. Non distended. There is no CVA tenderness.  No rebound, rigidity, or guarding. Musculoskeletal:   Normal range of motion in all extremities. No joint effusions.  No lower extremity tenderness.  No edema. Neurologic:   Normal speech and language.  Motor grossly intact. No acute focal neurologic deficits are appreciated.  Skin:    Skin is warm, dry and intact. No rash noted.  No petechiae, purpura, or bullae.  ____________________________________________    LABS (pertinent positives/negatives) (all labs ordered are listed, but only abnormal results are displayed) Labs Reviewed - No data to display ____________________________________________   EKG    ____________________________________________    RADIOLOGY  No results found.  ____________________________________________   PROCEDURES Procedures  ____________________________________________    CLINICAL IMPRESSION / ASSESSMENT AND PLAN / ED COURSE  Pertinent labs & imaging results that were available during my care of the patient were reviewed by me and considered in my medical decision making (see chart for details).    Patient presents with complaint of high blood pressure as well as nausea after eating [place] fried chicken.  No other significant acute complaints.  Vital signs unremarkable except for uncontrolled hypertension which is high enough that it warrants starting additional medication at this time.  Exam is benign.  No further work-up needed.  No evidence of stroke, infection, ACS PE dissection AAA pneumothorax pericarditis or intra-abdominal  pathology.  Doubt vascular pathology.  We will start 5 mg amlodipine, continue daily until she follows up with her doctor.      ____________________________________________   FINAL CLINICAL IMPRESSION(S) / ED DIAGNOSES    Final diagnoses:  Hypertension, unspecified type     ED Discharge Orders    None      Portions of this note were generated with dragon dictation software. Dictation errors may occur despite best attempts at proofreading.   Sharman CheekStafford, Shanyia Stines, MD 01/02/19 2158

## 2019-01-02 NOTE — ED Triage Notes (Signed)
Pt presents from home to ED via [facility] with c/o hypertension. Pt reports that blood pressures 180/100s at home with self monitor. Pt states she took bp med about 1 hour ago. Pt c/o nausea at this time. Pt denies headache, vomiting, or dizziness at this time. Pt in NAD upon arrival to ED. BP 189/85 upon arrival.

## 2022-08-14 ENCOUNTER — Observation Stay
Admission: EM | Admit: 2022-08-14 | Discharge: 2022-08-15 | Disposition: A | Discharge status: Home / Self Care | Payer: Medicare Other | Attending: Internal Medicine | Admitting: Internal Medicine

## 2022-08-14 ENCOUNTER — Emergency Department: Payer: Medicare Other

## 2022-08-14 ENCOUNTER — Other Ambulatory Visit: Payer: Self-pay

## 2022-08-14 DIAGNOSIS — Z7982 Long term (current) use of aspirin: Secondary | ICD-10-CM | POA: Diagnosis not present

## 2022-08-14 DIAGNOSIS — Z79899 Other long term (current) drug therapy: Secondary | ICD-10-CM | POA: Diagnosis not present

## 2022-08-14 DIAGNOSIS — M6281 Muscle weakness (generalized): Secondary | ICD-10-CM | POA: Diagnosis not present

## 2022-08-14 DIAGNOSIS — Z8673 Personal history of transient ischemic attack (TIA), and cerebral infarction without residual deficits: Secondary | ICD-10-CM | POA: Insufficient documentation

## 2022-08-14 DIAGNOSIS — R4189 Other symptoms and signs involving cognitive functions and awareness: Secondary | ICD-10-CM | POA: Diagnosis present

## 2022-08-14 DIAGNOSIS — R112 Nausea with vomiting, unspecified: Secondary | ICD-10-CM | POA: Insufficient documentation

## 2022-08-14 DIAGNOSIS — R531 Weakness: Principal | ICD-10-CM | POA: Insufficient documentation

## 2022-08-14 DIAGNOSIS — R4182 Altered mental status, unspecified: Secondary | ICD-10-CM | POA: Diagnosis not present

## 2022-08-14 DIAGNOSIS — N179 Acute kidney failure, unspecified: Secondary | ICD-10-CM | POA: Diagnosis not present

## 2022-08-14 DIAGNOSIS — I129 Hypertensive chronic kidney disease with stage 1 through stage 4 chronic kidney disease, or unspecified chronic kidney disease: Secondary | ICD-10-CM | POA: Insufficient documentation

## 2022-08-14 DIAGNOSIS — E876 Hypokalemia: Secondary | ICD-10-CM | POA: Diagnosis not present

## 2022-08-14 DIAGNOSIS — R110 Nausea: Secondary | ICD-10-CM | POA: Diagnosis present

## 2022-08-14 DIAGNOSIS — R2681 Unsteadiness on feet: Secondary | ICD-10-CM | POA: Diagnosis not present

## 2022-08-14 DIAGNOSIS — N1831 Chronic kidney disease, stage 3a: Secondary | ICD-10-CM | POA: Insufficient documentation

## 2022-08-14 DIAGNOSIS — N390 Urinary tract infection, site not specified: Secondary | ICD-10-CM | POA: Insufficient documentation

## 2022-08-14 DIAGNOSIS — I10 Essential (primary) hypertension: Secondary | ICD-10-CM | POA: Diagnosis present

## 2022-08-14 DIAGNOSIS — N183 Chronic kidney disease, stage 3 unspecified: Secondary | ICD-10-CM | POA: Diagnosis present

## 2022-08-14 LAB — CBC WITH DIFFERENTIAL/PLATELET
Abs Immature Granulocytes: 0.02 10*3/uL (ref 0.00–0.07)
Basophils Absolute: 0 10*3/uL (ref 0.0–0.1)
Basophils Relative: 0 %
Eosinophils Absolute: 0 10*3/uL (ref 0.0–0.5)
Eosinophils Relative: 0 %
HCT: 39.3 % (ref 36.0–46.0)
Hemoglobin: 12.4 g/dL (ref 12.0–15.0)
Immature Granulocytes: 0 %
Lymphocytes Relative: 18 %
Lymphs Abs: 1.5 10*3/uL (ref 0.7–4.0)
MCH: 26.9 pg (ref 26.0–34.0)
MCHC: 31.6 g/dL (ref 30.0–36.0)
MCV: 85.2 fL (ref 80.0–100.0)
Monocytes Absolute: 0.4 10*3/uL (ref 0.1–1.0)
Monocytes Relative: 4 %
Neutro Abs: 6.8 10*3/uL (ref 1.7–7.7)
Neutrophils Relative %: 78 %
Platelets: 185 10*3/uL (ref 150–400)
RBC: 4.61 MIL/uL (ref 3.87–5.11)
RDW: 14.2 % (ref 11.5–15.5)
WBC: 8.8 10*3/uL (ref 4.0–10.5)
nRBC: 0 % (ref 0.0–0.2)

## 2022-08-14 LAB — URINALYSIS, COMPLETE (UACMP) WITH MICROSCOPIC
Bilirubin Urine: NEGATIVE
Glucose, UA: NEGATIVE mg/dL
Ketones, ur: NEGATIVE mg/dL
Leukocytes,Ua: NEGATIVE
Nitrite: NEGATIVE
Protein, ur: 30 mg/dL — AB
Specific Gravity, Urine: 1.011 (ref 1.005–1.030)
pH: 7 (ref 5.0–8.0)

## 2022-08-14 LAB — COMPREHENSIVE METABOLIC PANEL
ALT: 9 U/L (ref 0–44)
AST: 20 U/L (ref 15–41)
Albumin: 3.9 g/dL (ref 3.5–5.0)
Alkaline Phosphatase: 54 U/L (ref 38–126)
Anion gap: 9 (ref 5–15)
BUN: 20 mg/dL (ref 8–23)
CO2: 26 mmol/L (ref 22–32)
Calcium: 8.8 mg/dL — ABNORMAL LOW (ref 8.9–10.3)
Chloride: 106 mmol/L (ref 98–111)
Creatinine, Ser: 1.15 mg/dL — ABNORMAL HIGH (ref 0.44–1.00)
GFR, Estimated: 45 mL/min — ABNORMAL LOW (ref >60)
Glucose, Bld: 145 mg/dL — ABNORMAL HIGH (ref 70–99)
Potassium: 3.4 mmol/L — ABNORMAL LOW (ref 3.5–5.1)
Sodium: 141 mmol/L (ref 135–145)
Total Bilirubin: 0.5 mg/dL (ref 0.3–1.2)
Total Protein: 7.1 g/dL (ref 6.5–8.1)

## 2022-08-14 LAB — LACTIC ACID, PLASMA
Lactic Acid, Venous: 1.2 mmol/L (ref 0.5–1.9)
Lactic Acid, Venous: 1.7 mmol/L (ref 0.5–1.9)

## 2022-08-14 LAB — LIPASE, BLOOD: Lipase: 26 U/L (ref 11–51)

## 2022-08-14 MED ORDER — ACETAMINOPHEN 650 MG RE SUPP: 650.0000 mg | Freq: Four times a day (QID) | RECTAL | Status: DC | PRN

## 2022-08-14 MED ORDER — POLYETHYLENE GLYCOL 3350 17 G PO PACK: 17.0000 g | PACK | Freq: Every day | ORAL | Status: DC | PRN

## 2022-08-14 MED ORDER — SODIUM CHLORIDE 0.45 % IV SOLN
INTRAVENOUS | Status: AC
  Filled 2022-08-14: qty 1000

## 2022-08-14 MED ORDER — SODIUM CHLORIDE 0.45 % IV SOLN: INTRAVENOUS | Status: DC

## 2022-08-14 MED ORDER — IOHEXOL 300 MG/ML  SOLN
75.0000 mL | Freq: Once | INTRAMUSCULAR | Status: AC | PRN
  Administered 2022-08-14: 75 mL via INTRAVENOUS

## 2022-08-14 MED ORDER — ENOXAPARIN SODIUM 30 MG/0.3ML IJ SOSY
30.0000 mg | PREFILLED_SYRINGE | INTRAMUSCULAR | Status: DC
  Administered 2022-08-15: 30 mg via SUBCUTANEOUS
  Filled 2022-08-14: qty 0.3

## 2022-08-14 MED ORDER — DONEPEZIL HCL 5 MG PO TABS: 5.0000 mg | ORAL_TABLET | Freq: Every day | ORAL | Status: DC

## 2022-08-14 MED ORDER — ACETAMINOPHEN 325 MG PO TABS: 650.0000 mg | ORAL_TABLET | Freq: Four times a day (QID) | ORAL | Status: DC | PRN

## 2022-08-14 MED ORDER — BISACODYL 5 MG PO TBEC: 5.0000 mg | DELAYED_RELEASE_TABLET | Freq: Every day | ORAL | Status: DC | PRN

## 2022-08-14 MED ORDER — SODIUM CHLORIDE 0.9 % IV SOLN
1.0000 g | INTRAVENOUS | Status: DC
  Administered 2022-08-14: 1 g via INTRAVENOUS
  Filled 2022-08-14: qty 10

## 2022-08-14 MED ORDER — ONDANSETRON HCL 4 MG/2ML IJ SOLN: 4.0000 mg | Freq: Four times a day (QID) | INTRAMUSCULAR | Status: DC | PRN

## 2022-08-14 MED ORDER — ONDANSETRON HCL 4 MG PO TABS: 4.0000 mg | ORAL_TABLET | Freq: Four times a day (QID) | ORAL | Status: DC | PRN

## 2022-08-14 MED ORDER — ONDANSETRON HCL 4 MG/2ML IJ SOLN: 4.0000 mg | Freq: Once | INTRAMUSCULAR | Status: DC

## 2022-08-14 MED ORDER — METOPROLOL TARTRATE 25 MG PO TABS
12.5000 mg | ORAL_TABLET | Freq: Two times a day (BID) | ORAL | Status: DC
  Administered 2022-08-15 (×2): 12.5 mg via ORAL
  Filled 2022-08-14 (×2): qty 1

## 2022-08-14 MED ORDER — MONTELUKAST SODIUM 10 MG PO TABS
10.0000 mg | ORAL_TABLET | Freq: Every day | ORAL | Status: DC
  Filled 2022-08-14 (×2): qty 1

## 2022-08-14 MED ORDER — LACTATED RINGERS IV BOLUS (SEPSIS)
500.0000 mL | Freq: Once | INTRAVENOUS | Status: AC
  Administered 2022-08-14: 500 mL via INTRAVENOUS

## 2022-08-14 MED ORDER — GENTAMICIN SULFATE 40 MG/ML IJ SOLN: 1.5000 mg/kg | Freq: Once | INTRAVENOUS | Status: DC

## 2022-08-14 MED ORDER — CLONAZEPAM 0.5 MG PO TABS: 0.5000 mg | ORAL_TABLET | Freq: Two times a day (BID) | ORAL | Status: DC | PRN

## 2022-08-14 MED ORDER — ASPIRIN 325 MG PO TBEC
325.0000 mg | DELAYED_RELEASE_TABLET | Freq: Every day | ORAL | Status: DC
  Administered 2022-08-15: 325 mg via ORAL
  Filled 2022-08-14 (×2): qty 1

## 2022-08-14 MED ORDER — AMLODIPINE BESYLATE 5 MG PO TABS
5.0000 mg | ORAL_TABLET | Freq: Every day | ORAL | Status: DC
  Administered 2022-08-15: 5 mg via ORAL
  Filled 2022-08-14 (×2): qty 1

## 2022-08-14 NOTE — Assessment & Plan Note (Signed)
Unclear etiology and may be secondary to possible gastritis Patient denies having any abdominal pain and had 1 episode of emesis Full liquid diet and advance as tolerated

## 2022-08-14 NOTE — Assessment & Plan Note (Signed)
Continue amlodipine and metoprolol for blood pressure control 

## 2022-08-14 NOTE — Progress Notes (Signed)
PHARMACIST - PHYSICIAN COMMUNICATION  CONCERNING:  Enoxaparin (Lovenox) for DVT Prophylaxis    RECOMMENDATION: Patient was prescribed enoxaprin 40mg  q24 hours for VTE prophylaxis.   Filed Weights   08/14/22 1321  Weight: 53.5 kg (117 lb 15.1 oz)    Body mass index is 17.42 kg/m.  Estimated Creatinine Clearance: 26.4 mL/min (A) (by C-G formula based on SCr of 1.15 mg/dL (H)).  Patient is candidate for enoxaparin 30mg  every 24 hours based on CrCl <25ml/min or Weight <45kg  DESCRIPTION: Pharmacy has adjusted enoxaparin dose per [facility] policy.  Patient is now receiving enoxaparin 30 mg every 24 hours    31m, PharmD Clinical Pharmacist  08/14/2022 4:44 PM

## 2022-08-14 NOTE — Assessment & Plan Note (Signed)
Secondary to hypertension Renal function is stable with baseline serum creatinine of 1.2

## 2022-08-14 NOTE — Assessment & Plan Note (Signed)
Unclear etiology We will request PT evaluation

## 2022-08-14 NOTE — ED Provider Notes (Signed)
[facility] Provider Note    Event Date/Time   First MD Initiated Contact with Patient 08/14/22 1326     (approximate)   History   Altered Mental Status and Nausea   HPI  Molly Parks is a 88 y.o. female   Past medical history of COPD, hypertension, lives at home with her son who presents with generalized weakness and confusion since yesterday.  Some nausea and vomiting.  Otherwise, denies chest pain, shortness of breath, cough, abdominal pain, dysuria or diarrhea.  Denies falls or traumas.  History was obtained via patient and her son who is at bedside.      Physical Exam   Triage Vital Signs: ED Triage Vitals [08/14/22 1321]  Enc Vitals Group     BP (!) 165/78     Pulse Rate 69     Resp 18     Temp 97.8 F (36.6 C)     Temp Source Oral     SpO2 100 %     Weight 117 lb 15.1 oz (53.5 kg)     Height 5\' 9"  (1.753 m)     Head Circumference      Peak Flow      Pain Score 0     Pain Loc      Pain Edu?      Excl. in GC?     Most recent vital signs: Vitals:   08/14/22 1321 08/14/22 1430  BP: (!) 165/78 (!) 162/76  Pulse: 69 67  Resp: 18 15  Temp: 97.8 F (36.6 C) 97.8 F (36.6 C)  SpO2: 100% 100%    General: Awake, no distress.  Frail appearing, dry mucous membranes. CV:  Normal rate and rhythm, no murmurs Resp:  Normal effort.  clear to auscultation Abd:  No distention.  Nontender   ED Results / Procedures / Treatments   Labs (all labs ordered are listed, but only abnormal results are displayed) Labs Reviewed  COMPREHENSIVE METABOLIC PANEL - Abnormal; Notable for the following components:      Result Value   Potassium 3.4 (*)    Glucose, Bld 145 (*)    Creatinine, Ser 1.15 (*)    Calcium 8.8 (*)    GFR, Estimated 45 (*)    All other components within normal limits  URINALYSIS, COMPLETE (UACMP) WITH MICROSCOPIC - Abnormal; Notable for the following components:   Color, Urine STRAW (*)    APPearance CLEAR (*)    Hgb  urine dipstick SMALL (*)    Protein, ur 30 (*)    Bacteria, UA MANY (*)    All other components within normal limits  LACTIC ACID, PLASMA  CBC WITH DIFFERENTIAL/PLATELET  LIPASE, BLOOD  LACTIC ACID, PLASMA     I reviewed labs and they are notable for bacteruria   EKG  ED ECG REPORT I, 08/16/22, the attending physician, personally viewed and interpreted this ECG.   Date: 08/14/2022  EKG Time: 1319   Rate: 70  Rhythm: normal EKG, normal sinus rhythm  Axis normal, PVCs, no ischemia    RADIOLOGY I dependently reviewed and interpreted cxr and see no focal opacity or pneumothorax   PROCEDURES:  Critical Care performed: No  Procedures   MEDICATIONS ORDERED IN ED: Medications  ondansetron (ZOFRAN) injection 4 mg (4 mg Intravenous Not Given 08/14/22 1432)  gentamicin (GARAMYCIN) 80 mg in dextrose 5 % 50 mL IVPB (has no administration in time range)  lactated ringers bolus 500 mL (0 mLs Intravenous Stopped  08/14/22 1433)  iohexol (OMNIPAQUE) 300 MG/ML solution 75 mL (75 mLs Intravenous Contrast Given 08/14/22 1457)     IMPRESSION / MDM / ASSESSMENT AND PLAN / ED COURSE  I reviewed the triage vital signs and the nursing notes.                              Differential diagnosis includes, but is not limited to, urinary tract infection, pulmonary infection, AKI, metabolic derangements, intracranial bleeding.   The patient is on the cardiac monitor to evaluate for evidence of arrhythmia and/or significant heart rate changes.  MDM: Generalized weakness in this 88 year old woman with no focal infectious symptoms.  Some nausea and vomiting, will get CT scan to assess for intra-abdominal infection.  Broad infectious work-up and metabolic work-up.  CT scan of the head given altered mental status from baseline.  No focal neurological deficits doubt stroke. Work-up revealing of AKI and urinary tract infection.  The scan of the head and abdomen pelvis negative for acute surgical  pathology.  Admit urinary tract infection, antibiotics ordered.   Patient's presentation is most consistent with acute presentation with potential threat to life or bodily function.       FINAL CLINICAL IMPRESSION(S) / ED DIAGNOSES   Final diagnoses:  AKI (acute kidney injury) (HCC)  Urinary tract infection without hematuria, site unspecified     Rx / DC Orders   ED Discharge Orders     None        Note:  This document was prepared using Dragon voice recognition software and may include unintentional dictation errors.    Pilar Jarvis, MD 08/14/22 [phone]

## 2022-08-14 NOTE — ED Triage Notes (Signed)
Pt here with AMS and nausea. Pt here with her son who states the pt is not like this. Pt vomiting in the lobby.

## 2022-08-14 NOTE — H&P (Signed)
History and Physical    Patient: Molly Parks BUL:[id number] DOB: 12-26-1927 DOA: 08/14/2022 DOS: the patient was seen and examined on 08/14/2022 PCP: Dione Housekeeper, MD  Patient coming from: Home  Chief Complaint:  Chief Complaint  Patient presents with   Altered Mental Status   Nausea   HPI: Molly Parks is a 88 y.o. female with medical history significant for hypertension, stage III chronic kidney disease, COPD, history of CVA who was brought into the ER by her son because she said she did not feel well. Patient had 1 episode of emesis in the waiting room and responded to Zofran.  She denies having any further nausea or episodes of vomiting.  She denies having any abdominal pain or changes in her bowel habits.  She denies having any dysuria, no nocturia, no headache, no fever, no chills, no leg swelling, no blurred vision, no cough, no focal deficit. Son states that she ambulates with a cane and has not had any recent falls. Labs reveal bacteriuria and patient received a dose of IV antibiotics in the ER. She will be referred to observation status for further evaluation  Review of Systems: As mentioned in the history of present illness. All other systems reviewed and are negative. Past Medical History:  Diagnosis Date   COPD (chronic obstructive pulmonary disease) (HCC)    Dislocation, shoulder closed, left, initial encounter    Hypertension    Past Surgical History:  Procedure Laterality Date   OOPHORECTOMY Right    Social History:  reports that she has never smoked. She has never used smokeless tobacco. She reports that she does not drink alcohol. No history on file for drug use.  Allergies  Allergen Reactions   Ciprofloxacin    Penicillins     Has patient had a PCN reaction causing immediate rash, facial/tongue/throat swelling, SOB or lightheadedness with hypotension: Unknown Has patient had a PCN reaction causing severe rash involving mucus membranes or skin  necrosis: Unknown Has patient had a PCN reaction that required hospitalization: Unknown Has patient had a PCN reaction occurring within the last 10 years: Unknown If all of the above answers are "NO", then may proceed with Cephalosporin use.    No family history on file.  Prior to Admission medications   Medication Sig Start Date End Date Taking? Authorizing Provider  amLODipine (NORVASC) 5 MG tablet Take 1 tablet (5 mg total) by mouth daily. 01/02/19   Sharman Cheek, MD  aspirin 325 MG EC tablet Take 325 mg daily by mouth.    [provider]  bisacodyl (DULCOLAX) 5 MG EC tablet Take 1 tablet (5 mg total) daily as needed by mouth for moderate constipation. 11/05/17   Shaune Pollack, MD  clonazePAM (KLONOPIN) 0.5 MG tablet Take 0.5 tablets (0.25 mg total) by mouth 2 (two) times daily as needed. 03/11/18   Adrian Saran, MD  donepezil (ARICEPT) 5 MG tablet Take 5 mg by mouth daily. 03/25/22   [provider]  metoprolol tartrate (LOPRESSOR) 25 MG tablet Take 0.5 tablets (12.5 mg total) 2 (two) times daily by mouth. 11/05/17   Shaune Pollack, MD  montelukast (SINGULAIR) 10 MG tablet Take 10 mg by mouth daily. 05/09/22   [provider]  polyethylene glycol (MIRALAX / GLYCOLAX) packet Take 17 g by mouth daily as needed for mild constipation. 03/11/18   Adrian Saran, MD    Physical Exam: Vitals:   08/14/22 1321 08/14/22 1430  BP: (!) 165/78 (!) 162/76  Pulse: 69 67  Resp: 18 15  Temp: 97.8 F (36.6 C) 97.8 F (36.6 C)  TempSrc: Oral Oral  SpO2: 100% 100%  Weight: 53.5 kg   Height: 5\' 9"  (1.753 m)    Physical Exam Vitals and nursing note reviewed.  Constitutional:      Comments: Chronically ill-appearing, frail, oriented to person and place  HENT:     Head: Normocephalic.     Nose: Nose normal.     Mouth/Throat:     Mouth: Mucous membranes are moist.  Eyes:     Pupils: Pupils are equal, round, and reactive to light.  Cardiovascular:     Rate and Rhythm: Normal  rate and regular rhythm.  Pulmonary:     Effort: Pulmonary effort is normal.     Breath sounds: Normal breath sounds.  Abdominal:     General: Abdomen is flat. Bowel sounds are normal.     Palpations: Abdomen is soft.  Musculoskeletal:        General: Normal range of motion.     Cervical back: Normal range of motion and neck supple.     Left lower leg: Left lower leg edema: hdd.  Skin:    General: Skin is warm and dry.  Neurological:     General: No focal deficit present.     Mental Status: She is alert.     Motor: Weakness present.     Comments: Oriented to person and place  Psychiatric:        Mood and Affect: Mood normal.        Behavior: Behavior normal.     Data Reviewed: Relevant notes from primary care and specialist visits, past discharge summaries as available in EHR, including Care Everywhere. Prior diagnostic testing as pertinent to current admission diagnoses Updated medications and problem lists for reconciliation ED course, including vitals, labs, imaging, treatment and response to treatment Triage notes, nursing and pharmacy notes and ED provider's notes Notable results as noted in HPI Labs reviewed.  Sodium 141, potassium 3.4, chloride 106, bicarb 26, glucose 145, BUN 20, creatinine 1.15, calcium 8.8, total protein 7.9, albumin 3.9, AST 20, ALT 9, alkaline phosphatase 54, total bilirubin 0.5, lipase 26, lactic acid 1.2, white count 8.8, hemoglobin 12.4, hematocrit 39.3, platelet count 185 CT scan of abdomen and pelvis showed distal colonic diverticulosis without evidence of diverticulitis. No acute intra-abdominal or intrapelvic abnormalities.Aortic Atherosclerosis  CT scan of the head without contrast showed No acute intracranial finding. Generalized age related volume loss. Old lacunar infarction left cerebellum. Old right occipital infarction. Chronic small-vessel ischemic changes of the cerebral hemispheric white matter. Chronic inflammatory changes of the right  division of the sphenoid sinus. Chest x-ray reviewed by me shows no signs of pulmonary edema or focal pulmonary consolidation. Poor inspiration. There is a 12 mm sclerotic density in the proximal shaft of right humerus, possibly benign bone island or sclerotic metastatic disease. If there is known history of malignancy, follow-up radionuclide bone scan may be considered. Twelve-lead EKG reviewed by me shows sinus rhythm.  PVCs There are no new results to review at this time.  Assessment and Plan: * Weakness Unclear etiology We will request PT evaluation  Hypokalemia Probably related to GI losses from emesis Supplement potassium  Nausea Unclear etiology and may be secondary to possible gastritis Patient denies having any abdominal pain and had 1 episode of emesis Full liquid diet and advance as tolerated  Cognitive decline Stable Patient is oriented to person and place Continue Aricept  CKD (chronic kidney  disease) stage 3, GFR 30-59 ml/min (HCC) Secondary to hypertension Renal function is stable with baseline serum creatinine of 1.2  Essential hypertension Continue amlodipine and metoprolol for blood pressure control      Advance Care Planning:   Code Status: Full Code   Consults: Physical therapy  Family Communication: Greater than 50% of time was spent discussing patient's condition and plan of care with her son at the bedside.  All questions and concerns have been addressed.  He verbalized understanding and agree with the plan.  Severity of Illness: The appropriate patient status for this patient is OBSERVATION. Observation status is judged to be reasonable and necessary in order to provide the required intensity of service to ensure the patient's safety. The patient's presenting symptoms, physical exam findings, and initial radiographic and laboratory data in the context of their medical condition is felt to place them at decreased risk for further clinical  deterioration. Furthermore, it is anticipated that the patient will be medically stable for discharge from the hospital within 2 midnights of admission.   Author: Lucile Shutters, MD 08/14/2022 4:51 PM  For on call review [email or web address].

## 2022-08-14 NOTE — Assessment & Plan Note (Signed)
Probably related to GI losses from emesis Supplement potassium

## 2022-08-14 NOTE — Assessment & Plan Note (Signed)
Stable Patient is oriented to person and place Continue Aricept

## 2022-08-15 ENCOUNTER — Encounter: Payer: Self-pay | Admitting: Internal Medicine

## 2022-08-15 DIAGNOSIS — R531 Weakness: Secondary | ICD-10-CM | POA: Diagnosis not present

## 2022-08-15 DIAGNOSIS — N1831 Chronic kidney disease, stage 3a: Secondary | ICD-10-CM

## 2022-08-15 LAB — BASIC METABOLIC PANEL
Anion gap: 6 (ref 5–15)
BUN: 14 mg/dL (ref 8–23)
CO2: 30 mmol/L (ref 22–32)
Calcium: 9.1 mg/dL (ref 8.9–10.3)
Chloride: 104 mmol/L (ref 98–111)
Creatinine, Ser: 0.88 mg/dL (ref 0.44–1.00)
GFR, Estimated: >60 mL/min (ref >60)
Glucose, Bld: 99 mg/dL (ref 70–99)
Potassium: 3.8 mmol/L (ref 3.5–5.1)
Sodium: 140 mmol/L (ref 135–145)

## 2022-08-15 LAB — CBC
HCT: 44 % (ref 36.0–46.0)
Hemoglobin: 14.1 g/dL (ref 12.0–15.0)
MCH: 27.1 pg (ref 26.0–34.0)
MCHC: 32 g/dL (ref 30.0–36.0)
MCV: 84.6 fL (ref 80.0–100.0)
Platelets: 194 10*3/uL (ref 150–400)
RBC: 5.2 MIL/uL — ABNORMAL HIGH (ref 3.87–5.11)
RDW: 14 % (ref 11.5–15.5)
WBC: 8 10*3/uL (ref 4.0–10.5)
nRBC: 0 % (ref 0.0–0.2)

## 2022-08-15 MED ORDER — ENOXAPARIN SODIUM 40 MG/0.4ML IJ SOSY: 40.0000 mg | PREFILLED_SYRINGE | INTRAMUSCULAR | Status: DC

## 2022-08-15 NOTE — Evaluation (Signed)
Physical Therapy Evaluation Patient Details Name: Molly Parks MRN: [id number] DOB: Apr 03, 1928 Today's Date: 08/15/2022  History of Present Illness  Patient is a 88 year old female presenting with generalized weakness and confusion, some nausea and vomiting. Medical history significant for hypertension, stage III chronic kidney disease, COPD, history of CVA.   Clinical Impression  Patient is agreeable to PT evaluation. She was sleeping in the bed on arrival to room with no family present. She is able to follow all commands and is hopeful to return home today where she lives with her son.  The patient was able to stand with minimal assistance and ambulate a short distance in the room with rolling walker. Standing activity tolerance limited by fatigue and generalized weakness. Anticipated she can return home with intermittent assistance from her family. I would recommended rolling walker for ambulation at this time for safety and fall prevention. Her son reports there are walkers in the home. HHPT recommended also. PT will continue to follow while in the hospital to maximize independence and decrease caregiver burden.      Recommendations for follow up therapy are one component of a multi-disciplinary discharge planning process, led by the attending physician.  Recommendations may be updated based on patient status, additional functional criteria and insurance authorization.  Follow Up Recommendations Home health PT      Assistance Recommended at Discharge Intermittent Supervision/Assistance  Patient can return home with the following  A little help with walking and/or transfers;A little help with bathing/dressing/bathroom;Help with stairs or ramp for entrance;Assist for transportation    Equipment Recommendations None recommended by PT  Recommendations for Other Services       Functional Status Assessment Patient has had a recent decline in their functional status and demonstrates the  ability to make significant improvements in function in a reasonable and predictable amount of time.     Precautions / Restrictions Precautions Precautions: Fall Restrictions Weight Bearing Restrictions: No      Mobility  Bed Mobility Overal bed mobility: Needs Assistance Bed Mobility: Supine to Sit     Supine to sit: Supervision     General bed mobility comments: increased time required with no physical assistance needed    Transfers Overall transfer level: Needs assistance Equipment used: Rolling walker (2 wheels) Transfers: Sit to/from Stand Sit to Stand: Min assist           General transfer comment: lifting assistance required for standing. verbal cues for hand palcement with standing    Ambulation/Gait Ambulation/Gait assistance: Min guard Gait Distance (Feet): 20 Feet Assistive device: Rolling walker (2 wheels) Gait Pattern/deviations: Step-to pattern, Decreased stride length, Trunk flexed Gait velocity: decreased     General Gait Details: verbal cues to stand closer to rolling walker for support and to increased step length bilaterally. slow but steady ambulation. recommend to use rolling walker instead of the cane at this time for optimal support with ambulation and to prevent falls.  Stairs            Wheelchair Mobility    Modified Rankin (Stroke Patients Only)       Balance Overall balance assessment: Needs assistance Sitting-balance support: Feet supported Sitting balance-Leahy Scale: Good     Standing balance support: Bilateral upper extremity supported, Reliant on assistive device for balance Standing balance-Leahy Scale: Fair Standing balance comment: faciliation for anterior weight shifting initially with standing progressing to fair standing balance with rolling walker for support  Pertinent Vitals/Pain Pain Assessment Pain Assessment: No/denies pain    Home Living Family/patient  expects to be discharged to:: Private residence Living Arrangements: Children (son) Available Help at Discharge: Family Type of Home: House Home Access: Stairs to enter Entrance Stairs-Rails: Psychiatric nurse of Steps:  ("a couple")   Home Layout: One level Home Equipment: [place] - single Barista (2 wheels)      Prior Function Prior Level of Function : Needs assist             Mobility Comments: patient is ambulatory with a cane at home. she reports she has assistance if needed at home. son confirms that there are rolling walkers in the home as well if needed ADLs Comments: patient reports PRN assistance from family, help is available if needed     Hand Dominance        Extremity/Trunk Assessment   Upper Extremity Assessment Upper Extremity Assessment: Overall WFL for tasks assessed    Lower Extremity Assessment Lower Extremity Assessment: Generalized weakness (endurance impaired for sustained activity in standing)       Communication   Communication: No difficulties  Cognition Arousal/Alertness: Awake/alert Behavior During Therapy: WFL for tasks assessed/performed Overall Cognitive Status: Within Functional Limits for tasks assessed                                 General Comments: patient is oriented to person, place. she was unsure of why she is here. she is able to follow all directions without difficulty        General Comments General comments (skin integrity, edema, etc.): patient sitting up in recliner chair on arrival to room awaiting breakfast tray. spoke with the son in the hallway, who arrived after evaluation, and he is asking if the patient will be discharged today- alerted the MD    Exercises     Assessment/Plan    PT Assessment Patient needs continued PT services  PT Problem List Decreased strength;Decreased activity tolerance;Decreased balance;Decreased mobility       PT Treatment Interventions  DME instruction;Gait training;Functional mobility training;Stair training;Therapeutic activities;Therapeutic exercise;Balance training;Neuromuscular re-education;Cognitive remediation;Patient/family education    PT Goals (Current goals can be found in the Care Plan section)  Acute Rehab PT Goals Patient Stated Goal: to return home PT Goal Formulation: With patient Time For Goal Achievement: 08/29/22 Potential to Achieve Goals: Good    Frequency Min 2X/week     Co-evaluation               AM-PAC PT "6 Clicks" Mobility  Outcome Measure Help needed turning from your back to your side while in a flat bed without using bedrails?: A Little Help needed moving from lying on your back to sitting on the side of a flat bed without using bedrails?: A Little Help needed moving to and from a bed to a chair (including a wheelchair)?: A Little Help needed standing up from a chair using your arms (e.g., wheelchair or bedside chair)?: A Little Help needed to walk in hospital room?: A Little Help needed climbing 3-5 steps with a railing? : A Little 6 Click Score: 18    End of Session Equipment Utilized During Treatment: Gait belt Activity Tolerance: Patient tolerated treatment well Patient left: in chair;with call bell/phone within reach;with chair alarm set Nurse Communication: Mobility status PT Visit Diagnosis: Unsteadiness on feet (R26.81);Muscle weakness (generalized) (M62.81)    Time: [id number] PT Time Calculation (min) (  ACUTE ONLY): 30 min   Charges:   PT Evaluation $PT Eval Low Complexity: 1 Low PT Treatments $Gait Training: 8-22 mins        Donna Bernard, PT, MPT   Ina Homes 08/15/2022, 9:35 AM

## 2022-08-15 NOTE — TOC Transition Note (Signed)
Transition of Care [facility]) - CM/SW Discharge Note   Patient Details  Name: Molly Parks MRN: [id number] Date of Birth: Aug 02, 1928  [facility] CM/SW Contact:  Truddie Hidden, RN Phone Number: 08/15/2022, 11:14 AM   Clinical Narrative:    Spoke with patient's son regarding home health. Referral made and accepted by Feliberto Gottron of [facility]. Son will transport patient home. TOC signing off.          Patient Goals and CMS Choice        Discharge Placement                       Discharge Plan and Services                                     Social Determinants of Health (SDOH) Interventions     Readmission Risk Interventions     No data to display

## 2022-08-15 NOTE — Progress Notes (Signed)
MD order received in [facility] to discharge pt home with home health today; [facility] established Home Health Physical Therapy services with [facility]; verbally reviewed AVS with pt and pt's son; no questions voiced at this time; discharge pending arrival of a volunteer for discharge

## 2022-08-15 NOTE — Discharge Summary (Addendum)
Physician Discharge Summary   Patient: Molly Parks MRN: [id number] DOB: 1928/06/26  Admit date:     08/14/2022  Discharge date: 08/15/22  Discharge Physician: Marrion Coy   PCP: Dione Housekeeper, MD   Recommendations at discharge:   Follow-up with PCP in 1 week.  Discharge Diagnoses: Principal Problem:   Weakness Active Problems:   Essential hypertension   CKD (chronic kidney disease) stage 3, GFR 30-59 ml/min (HCC)   Cognitive decline   Nausea   Hypokalemia Chronic kidney disease stage IIIa.  Acute kidney injury ruled out.   UTI ruled out.  Resolved Problems:   * No resolved hospital problems. *  Hospital Course: Molly Parks is a 88 y.o. female with medical history significant for hypertension, stage III chronic kidney disease, COPD, history of CVA who was brought into the ER by her son because she said she did not feel well.  In the emergency room, patient had episode of vomiting, but did not feel any nausea or abdominal pain.  She had a daily bowel movements which is normal.  She did not have any UTI, UA was without white cells. He had a mild hypokalemia, was given fluids with added potassium.  Condition has improved, she is asymptomatic today.  She is medically stable to be discharged.  Assessment and Plan: * Weakness Patient condition has improved, no evidence of stroke.  Hypokalemia Improved after potassium was given.  Nausea No longer has any nausea today.  Cognitive decline Patient condition back to baseline.  CKD (chronic kidney disease) stage 3, GFR 30-59 ml/min (HCC) Secondary to hypertension Renal function is stable with baseline serum creatinine of 1.2  Essential hypertension Resume home treatment.        Consultants: None Procedures performed: None  Disposition: Home Diet recommendation:  Discharge Diet Orders (From admission, onward)     Start     Ordered   08/15/22 0000  Diet - low sodium heart healthy        08/15/22 0931            Cardiac diet DISCHARGE MEDICATION: Allergies as of 08/15/2022       Reactions   Ciprofloxacin    Penicillins    Has patient had a PCN reaction causing immediate rash, facial/tongue/throat swelling, SOB or lightheadedness with hypotension: Unknown Has patient had a PCN reaction causing severe rash involving mucus membranes or skin necrosis: Unknown Has patient had a PCN reaction that required hospitalization: Unknown Has patient had a PCN reaction occurring within the last 10 years: Unknown If all of the above answers are "NO", then may proceed with Cephalosporin use.        Medication List     STOP taking these medications    donepezil 5 MG tablet Commonly known as: ARICEPT   montelukast 10 MG tablet Commonly known as: SINGULAIR       TAKE these medications    amLODipine 5 MG tablet Commonly known as: NORVASC Take 1 tablet (5 mg total) by mouth daily.   aspirin EC 325 MG tablet Take 325 mg daily by mouth.   bisacodyl 5 MG EC tablet Commonly known as: DULCOLAX Take 1 tablet (5 mg total) daily as needed by mouth for moderate constipation.   clonazePAM 0.5 MG tablet Commonly known as: KLONOPIN Take 0.5 tablets (0.25 mg total) by mouth 2 (two) times daily as needed.   metoprolol tartrate 25 MG tablet Commonly known as: LOPRESSOR Take 0.5 tablets (12.5 mg total) 2 (two) times  daily by mouth.   polyethylene glycol 17 g packet Commonly known as: MIRALAX / GLYCOLAX Take 17 g by mouth daily as needed for mild constipation.        Follow-up Information     Zada Finders Joycie Peek, MD Follow up in 1 week(s).   Specialty: Family Medicine Contact information: [address] [place] [address] [phone]                Discharge Exam: Ceasar Mons Weights   08/14/22 1321  Weight: 53.5 kg   General exam: Appears calm and comfortable  Respiratory system: Clear to auscultation. Respiratory effort normal. Cardiovascular system: S1 & S2 heard,  RRR. No JVD, murmurs, rubs, gallops or clicks. No pedal edema. Gastrointestinal system: Abdomen is nondistended, soft and nontender. No organomegaly or masses felt. Normal bowel sounds heard. Central nervous system: Alert and oriented. No focal neurological deficits. Extremities: Symmetric 5 x 5 power. Skin: No rashes, lesions or ulcers Psychiatry: Judgement and insight appear normal. Mood & affect appropriate.    Condition at discharge: good  The results of significant diagnostics from this hospitalization (including imaging, microbiology, ancillary and laboratory) are listed below for reference.   Imaging Studies: CT Abdomen Pelvis W Contrast  Result Date: 08/14/2022 CLINICAL DATA:  Altered mental status, nausea EXAM: CT ABDOMEN AND PELVIS WITH CONTRAST TECHNIQUE: Multidetector CT imaging of the abdomen and pelvis was performed using the standard protocol following bolus administration of intravenous contrast. RADIATION DOSE REDUCTION: This exam was performed according to the departmental dose-optimization program which includes automated exposure control, adjustment of the mA and/or kV according to patient size and/or use of iterative reconstruction technique. CONTRAST:  68mL OMNIPAQUE IOHEXOL 300 MG/ML SOLN IV. No oral contrast. COMPARISON:  03/10/2018 FINDINGS: Lower chest: Dependent bibasilar atelectasis Hepatobiliary: Post cholecystectomy. Minimal central intrahepatic biliary dilatation which may be postoperative, unchanged. No extrahepatic biliary dilatation. No hepatic mass. Pancreas: Atrophic pancreas without mass Spleen: Tiny calcified granulomata.  Otherwise unremarkable. Adrenals/Urinary Tract: Adrenal thickening without mass. Peripelvic renal cysts LEFT kidney; no follow-up imaging recommended. No renal mass, ureteral dilatation or urinary tract calcification. Stomach/Bowel: Distal colonic diverticulosis without evidence of diverticulitis. Scattered stool in colon. Normal appendix.  Stomach and bowel loops otherwise normal appearance. Vascular/Lymphatic: Vascular structures patent. Atherosclerotic calcifications aorta and iliac arteries without aneurysm. No adenopathy. Reproductive: Unremarkable uterus and ovaries Other: Trace nonspecific free pelvic fluid. No free air. No hernia. Musculoskeletal: Degenerative changes scoliosis thoracolumbar spine with osseous demineralization. IMPRESSION: Distal colonic diverticulosis without evidence of diverticulitis. No acute intra-abdominal or intrapelvic abnormalities. Aortic Atherosclerosis (ICD10-I70.0). Electronically Signed   By: Ulyses Southward M.D.   On: 08/14/2022 15:24   CT HEAD WO CONTRAST ( )  Result Date: 08/14/2022 CLINICAL DATA:  Mental status change of unknown cause. EXAM: CT HEAD WITHOUT CONTRAST TECHNIQUE: Contiguous axial images were obtained from the base of the skull through the vertex without intravenous contrast. RADIATION DOSE REDUCTION: This exam was performed according to the departmental dose-optimization program which includes automated exposure control, adjustment of the mA and/or kV according to patient size and/or use of iterative reconstruction technique. COMPARISON:  11/03/2017 FINDINGS: Brain: No acute finding affects the brainstem or cerebellum. Old small vessel cerebellar infarction on the left. Cerebral hemispheres show age related atrophy with chronic small-vessel ischemic change of the white matter and an old right occipital infarction. No mass, hemorrhage, hydrocephalus or extra-axial collection. Vascular: There is atherosclerotic calcification of the major vessels at the base of the brain. Skull: Negative Sinuses/Orbits: Chronic inflammatory changes  of the right division of the sphenoid sinus. Orbits negative. Other: None IMPRESSION: No acute intracranial finding. Generalized age related volume loss. Old lacunar infarction left cerebellum. Old right occipital infarction. Chronic small-vessel ischemic changes of the  cerebral hemispheric white matter. Chronic inflammatory changes of the right division of the sphenoid sinus. Electronically Signed   By: Nelson Chimes M.D.   On: 08/14/2022 15:18   DG Chest Port 1 View  Result Date: 08/14/2022 CLINICAL DATA:  Possible sepsis, altered mental status EXAM: PORTABLE CHEST 1 VIEW COMPARISON:  08/01/2017 FINDINGS: Cardiac size is within normal limits. There is poor inspiration. There are no signs of pulmonary edema or focal pulmonary consolidation. There is no pleural effusion or pneumothorax. There is 12 mm sclerotic density in the proximal shaft of right humerus. There is superior displacement of right humeral head suggesting possible chronic tear of the rotator cuff. Surgical clips are seen in right upper quadrant. IMPRESSION: There are no signs of pulmonary edema or focal pulmonary consolidation. Poor inspiration. There is a 12 mm sclerotic density in the proximal shaft of right humerus, possibly benign bone island or sclerotic metastatic disease. If there is known history of malignancy, follow-up radionuclide bone scan may be considered. Electronically Signed   By: Elmer Picker M.D.   On: 08/14/2022 14:10    Microbiology: No results found for this or any previous visit.  Labs: CBC: Recent Labs  Lab 08/14/22 1342 08/15/22 0425  WBC 8.8 8.0  NEUTROABS 6.8  --   HGB 12.4 14.1  HCT 39.3 44.0  MCV 85.2 84.6  PLT 185 [id number]   Basic Metabolic Panel: Recent Labs  Lab 08/14/22 1342 08/15/22 0425  NA 141 140  K 3.4* 3.8  CL 106 104  CO2 26 30  GLUCOSE 145* 99  BUN 20 14  CREATININE 1.15* 0.88  CALCIUM 8.8* 9.1   Liver Function Tests: Recent Labs  Lab 08/14/22 1342  AST 20  ALT 9  ALKPHOS 54  BILITOT 0.5  PROT 7.1  ALBUMIN 3.9   CBG: No results for input(s): "GLUCAP" in the last 168 hours.  Discharge time spent: less than 30 minutes.  Signed: Sharen Hones, MD [facility] 08/15/2022

## 2022-08-15 NOTE — Progress Notes (Signed)
PHARMACIST - PHYSICIAN COMMUNICATION  CONCERNING:  Enoxaparin (Lovenox) for DVT Prophylaxis    RECOMMENDATION: Patient was prescribed enoxaprin 30mg  q24 hours for VTE prophylaxis.   Filed Weights   08/14/22 1321  Weight: 53.5 kg (117 lb 15.1 oz)    Body mass index is 17.42 kg/m.  Estimated Creatinine Clearance: 34.5 mL/min (by C-G formula based on SCr of 0.88 mg/dL).  Patient is candidate for enoxaparin 40mg  every 24 hours based on CrCl >66ml/min or Weight >45kg  DESCRIPTION: Pharmacy has adjusted enoxaparin dose per [facility] policy.  Patient is now receiving enoxaparin 40 mg every 24 hours    31m PharmD Clinical Pharmacist 08/15/2022

## 2022-08-15 NOTE — Progress Notes (Signed)
Pt discharged via wheelchair by a volunteer to [facility] 

## 2022-08-15 NOTE — Discharge Instructions (Signed)
[facility] 

## 2022-08-15 NOTE — Plan of Care (Signed)

## 2023-06-02 ENCOUNTER — Other Ambulatory Visit: Payer: Self-pay

## 2023-06-02 ENCOUNTER — Emergency Department
Admission: EM | Admit: 2023-06-02 | Discharge: 2023-06-02 | Discharge status: Against Medical Advice | Payer: Medicare Other | Attending: Emergency Medicine | Admitting: Emergency Medicine

## 2023-06-02 DIAGNOSIS — Z5321 Procedure and treatment not carried out due to patient leaving prior to being seen by health care provider: Secondary | ICD-10-CM | POA: Diagnosis not present

## 2023-06-02 DIAGNOSIS — R350 Frequency of micturition: Secondary | ICD-10-CM | POA: Diagnosis not present

## 2023-06-02 DIAGNOSIS — R110 Nausea: Secondary | ICD-10-CM | POA: Insufficient documentation

## 2023-06-02 DIAGNOSIS — R531 Weakness: Secondary | ICD-10-CM | POA: Diagnosis present

## 2023-06-02 LAB — URINALYSIS, ROUTINE W REFLEX MICROSCOPIC
Bilirubin Urine: NEGATIVE
Glucose, UA: NEGATIVE mg/dL
Hgb urine dipstick: NEGATIVE
Ketones, ur: NEGATIVE mg/dL
Nitrite: NEGATIVE
Protein, ur: NEGATIVE mg/dL
Specific Gravity, Urine: 1.011 (ref 1.005–1.030)
pH: 8 (ref 5.0–8.0)

## 2023-06-02 LAB — CBC WITH DIFFERENTIAL/PLATELET
Abs Immature Granulocytes: 0.01 10*3/uL (ref 0.00–0.07)
Basophils Absolute: 0 10*3/uL (ref 0.0–0.1)
Basophils Relative: 0 %
Eosinophils Absolute: 0 10*3/uL (ref 0.0–0.5)
Eosinophils Relative: 0 %
HCT: 43.7 % (ref 36.0–46.0)
Hemoglobin: 13.6 g/dL (ref 12.0–15.0)
Immature Granulocytes: 0 %
Lymphocytes Relative: 21 %
Lymphs Abs: 1.4 10*3/uL (ref 0.7–4.0)
MCH: 26.8 pg (ref 26.0–34.0)
MCHC: 31.1 g/dL (ref 30.0–36.0)
MCV: 86.2 fL (ref 80.0–100.0)
Monocytes Absolute: 0.2 10*3/uL (ref 0.1–1.0)
Monocytes Relative: 4 %
Neutro Abs: 4.9 10*3/uL (ref 1.7–7.7)
Neutrophils Relative %: 75 %
Platelets: 174 10*3/uL (ref 150–400)
RBC: 5.07 MIL/uL (ref 3.87–5.11)
RDW: 14.3 % (ref 11.5–15.5)
WBC: 6.6 10*3/uL (ref 4.0–10.5)
nRBC: 0 % (ref 0.0–0.2)

## 2023-06-02 LAB — COMPREHENSIVE METABOLIC PANEL
ALT: 7 U/L (ref 0–44)
AST: 21 U/L (ref 15–41)
Albumin: 3.9 g/dL (ref 3.5–5.0)
Alkaline Phosphatase: 60 U/L (ref 38–126)
Anion gap: 10 (ref 5–15)
BUN: 15 mg/dL (ref 8–23)
CO2: 28 mmol/L (ref 22–32)
Calcium: 8.6 mg/dL — ABNORMAL LOW (ref 8.9–10.3)
Chloride: 99 mmol/L (ref 98–111)
Creatinine, Ser: 0.88 mg/dL (ref 0.44–1.00)
GFR, Estimated: >60 mL/min (ref >60)
Glucose, Bld: 145 mg/dL — ABNORMAL HIGH (ref 70–99)
Potassium: 3.8 mmol/L (ref 3.5–5.1)
Sodium: 137 mmol/L (ref 135–145)
Total Bilirubin: 0.9 mg/dL (ref 0.3–1.2)
Total Protein: 7.4 g/dL (ref 6.5–8.1)

## 2023-06-02 LAB — LIPASE, BLOOD: Lipase: 25 U/L (ref 11–51)

## 2023-06-02 LAB — TROPONIN I (HIGH SENSITIVITY): Troponin I (High Sensitivity): 9 ng/L (ref <18)

## 2023-06-02 NOTE — ED Notes (Signed)
Pts son wheeled pt up to triage desk stating "We are just going to go home." EDT informed pt that EDT can take pt to a room to be seen right now, as this pt was the next longest wait. Pt told EDT "I think I would rather go home. The wait is wearing on me." EDT encouraged pt and family member to stay, but both the pt and pts son insisted on leaving. Pts son told EDT "I don't think she is really sick. It is just more mental." EDT informed pt and family member that they would be leaving against medical advice and both pt and family member still decided to leave.   Pt seen being wheeled out of the ED lobby by her son at this time.

## 2023-06-02 NOTE — ED Triage Notes (Signed)
Pt presents to ER with c/o generalized weakness that she states started today.  Pt unable to expound upon her chief complaint, stating "I just feel bad."  Pt is c/o some nausea, but denies any vomiting.  Denies any recent sick contacts.  Does endorse some increased urinary frequency.  Pt lives at home with her son who is present with her at this time.  Pt is otherwise alert and in NAD.

## 2023-12-25 ENCOUNTER — Other Ambulatory Visit: Payer: Self-pay | Admitting: Nephrology

## 2023-12-25 DIAGNOSIS — N1832 Chronic kidney disease, stage 3b: Secondary | ICD-10-CM

## 2023-12-25 DIAGNOSIS — I10 Essential (primary) hypertension: Secondary | ICD-10-CM

## 2024-01-01 ENCOUNTER — Ambulatory Visit
Admission: RE | Admit: 2024-01-01 | Discharge: 2024-01-01 | Disposition: A | Discharge status: Home / Self Care | Payer: Medicare Other | Source: Ambulatory Visit | Attending: Nephrology | Admitting: Nephrology

## 2024-01-01 DIAGNOSIS — I10 Essential (primary) hypertension: Secondary | ICD-10-CM | POA: Diagnosis present

## 2024-01-01 DIAGNOSIS — N1832 Chronic kidney disease, stage 3b: Secondary | ICD-10-CM | POA: Insufficient documentation

## 2024-03-31 ENCOUNTER — Emergency Department
Admission: EM | Admit: 2024-03-31 | Discharge: 2024-03-31 | Discharge status: Against Medical Advice | Attending: Emergency Medicine | Admitting: Emergency Medicine

## 2024-03-31 ENCOUNTER — Other Ambulatory Visit: Payer: Self-pay

## 2024-03-31 DIAGNOSIS — F0390 Unspecified dementia without behavioral disturbance: Secondary | ICD-10-CM | POA: Diagnosis not present

## 2024-03-31 DIAGNOSIS — Z5321 Procedure and treatment not carried out due to patient leaving prior to being seen by health care provider: Secondary | ICD-10-CM | POA: Insufficient documentation

## 2024-03-31 DIAGNOSIS — R531 Weakness: Secondary | ICD-10-CM | POA: Diagnosis present

## 2024-03-31 HISTORY — DX: Unspecified dementia, unspecified severity, without behavioral disturbance, psychotic disturbance, mood disturbance, and anxiety: F03.90

## 2024-03-31 LAB — BASIC METABOLIC PANEL WITH GFR
Anion gap: 10 (ref 5–15)
BUN: 24 mg/dL — ABNORMAL HIGH (ref 8–23)
CO2: 24 mmol/L (ref 22–32)
Calcium: 8.6 mg/dL — ABNORMAL LOW (ref 8.9–10.3)
Chloride: 105 mmol/L (ref 98–111)
Creatinine, Ser: 1.62 mg/dL — ABNORMAL HIGH (ref 0.44–1.00)
GFR, Estimated: 29 mL/min — ABNORMAL LOW (ref >60)
Glucose, Bld: 110 mg/dL — ABNORMAL HIGH (ref 70–99)
Potassium: 3.6 mmol/L (ref 3.5–5.1)
Sodium: 139 mmol/L (ref 135–145)

## 2024-03-31 LAB — CBC
HCT: 38.8 % (ref 36.0–46.0)
Hemoglobin: 12.8 g/dL (ref 12.0–15.0)
MCH: 27.6 pg (ref 26.0–34.0)
MCHC: 33 g/dL (ref 30.0–36.0)
MCV: 83.6 fL (ref 80.0–100.0)
Platelets: 250 10*3/uL (ref 150–400)
RBC: 4.64 MIL/uL (ref 3.87–5.11)
RDW: 13.8 % (ref 11.5–15.5)
WBC: 6.4 10*3/uL (ref 4.0–10.5)
nRBC: 0 % (ref 0.0–0.2)

## 2024-03-31 NOTE — ED Triage Notes (Signed)
 Pt here with weakness. Pt has hx of dementia. Pt states she does not feel well but is not able to verbalize what is wrong. Family states this may be related to her dementia. Pt denies SOB or CP.

## 2024-03-31 NOTE — ED Notes (Signed)
 Pt's son to desk stating that they were leaving. This RN told family member that we had a room available that we can take her to. Son refused to stay. Son took patient in wheelchair out of ED. NAD noted.

## 2024-06-02 ENCOUNTER — Other Ambulatory Visit: Payer: Self-pay

## 2024-06-02 ENCOUNTER — Emergency Department

## 2024-06-02 ENCOUNTER — Observation Stay
Admission: EM | Admit: 2024-06-02 | Discharge: 2024-06-03 | Disposition: A | Discharge status: Hospice | Attending: Internal Medicine | Admitting: Internal Medicine

## 2024-06-02 ENCOUNTER — Encounter: Payer: Self-pay | Admitting: Emergency Medicine

## 2024-06-02 ENCOUNTER — Observation Stay

## 2024-06-02 DIAGNOSIS — Z681 Body mass index (BMI) 19 or less, adult: Secondary | ICD-10-CM | POA: Insufficient documentation

## 2024-06-02 DIAGNOSIS — R4182 Altered mental status, unspecified: Secondary | ICD-10-CM

## 2024-06-02 DIAGNOSIS — R7989 Other specified abnormal findings of blood chemistry: Secondary | ICD-10-CM | POA: Insufficient documentation

## 2024-06-02 DIAGNOSIS — L89159 Pressure ulcer of sacral region, unspecified stage: Secondary | ICD-10-CM | POA: Insufficient documentation

## 2024-06-02 DIAGNOSIS — E860 Dehydration: Secondary | ICD-10-CM | POA: Insufficient documentation

## 2024-06-02 DIAGNOSIS — G9341 Metabolic encephalopathy: Secondary | ICD-10-CM | POA: Diagnosis not present

## 2024-06-02 DIAGNOSIS — N1832 Chronic kidney disease, stage 3b: Secondary | ICD-10-CM | POA: Diagnosis not present

## 2024-06-02 DIAGNOSIS — R531 Weakness: Secondary | ICD-10-CM | POA: Diagnosis not present

## 2024-06-02 DIAGNOSIS — I10 Essential (primary) hypertension: Secondary | ICD-10-CM

## 2024-06-02 DIAGNOSIS — I639 Cerebral infarction, unspecified: Secondary | ICD-10-CM | POA: Insufficient documentation

## 2024-06-02 DIAGNOSIS — I5A Non-ischemic myocardial injury (non-traumatic): Secondary | ICD-10-CM | POA: Insufficient documentation

## 2024-06-02 DIAGNOSIS — Z79899 Other long term (current) drug therapy: Secondary | ICD-10-CM | POA: Insufficient documentation

## 2024-06-02 DIAGNOSIS — D696 Thrombocytopenia, unspecified: Secondary | ICD-10-CM | POA: Insufficient documentation

## 2024-06-02 DIAGNOSIS — Z7982 Long term (current) use of aspirin: Secondary | ICD-10-CM | POA: Diagnosis not present

## 2024-06-02 DIAGNOSIS — E440 Moderate protein-calorie malnutrition: Secondary | ICD-10-CM | POA: Diagnosis not present

## 2024-06-02 DIAGNOSIS — D72829 Elevated white blood cell count, unspecified: Secondary | ICD-10-CM | POA: Insufficient documentation

## 2024-06-02 DIAGNOSIS — J449 Chronic obstructive pulmonary disease, unspecified: Secondary | ICD-10-CM | POA: Diagnosis not present

## 2024-06-02 DIAGNOSIS — F419 Anxiety disorder, unspecified: Secondary | ICD-10-CM | POA: Insufficient documentation

## 2024-06-02 DIAGNOSIS — R1312 Dysphagia, oropharyngeal phase: Secondary | ICD-10-CM | POA: Insufficient documentation

## 2024-06-02 DIAGNOSIS — I129 Hypertensive chronic kidney disease with stage 1 through stage 4 chronic kidney disease, or unspecified chronic kidney disease: Secondary | ICD-10-CM | POA: Insufficient documentation

## 2024-06-02 DIAGNOSIS — N390 Urinary tract infection, site not specified: Secondary | ICD-10-CM | POA: Diagnosis not present

## 2024-06-02 DIAGNOSIS — F0390 Unspecified dementia without behavioral disturbance: Secondary | ICD-10-CM | POA: Diagnosis not present

## 2024-06-02 DIAGNOSIS — N19 Unspecified kidney failure: Secondary | ICD-10-CM

## 2024-06-02 DIAGNOSIS — J42 Unspecified chronic bronchitis: Secondary | ICD-10-CM

## 2024-06-02 DIAGNOSIS — L98429 Non-pressure chronic ulcer of back with unspecified severity: Secondary | ICD-10-CM | POA: Diagnosis present

## 2024-06-02 LAB — CBC WITH DIFFERENTIAL/PLATELET
Abs Immature Granulocytes: 0.06 10*3/uL (ref 0.00–0.07)
Basophils Absolute: 0 10*3/uL (ref 0.0–0.1)
Basophils Relative: 0 %
Eosinophils Absolute: 0 10*3/uL (ref 0.0–0.5)
Eosinophils Relative: 0 %
HCT: 48.8 % — ABNORMAL HIGH (ref 36.0–46.0)
Hemoglobin: 14.9 g/dL (ref 12.0–15.0)
Immature Granulocytes: 1 %
Lymphocytes Relative: 32 %
Lymphs Abs: 3.9 10*3/uL (ref 0.7–4.0)
MCH: 26.4 pg (ref 26.0–34.0)
MCHC: 30.5 g/dL (ref 30.0–36.0)
MCV: 86.4 fL (ref 80.0–100.0)
Monocytes Absolute: 0.9 10*3/uL (ref 0.1–1.0)
Monocytes Relative: 8 %
Neutro Abs: 7.4 10*3/uL (ref 1.7–7.7)
Neutrophils Relative %: 59 %
Platelets: 114 10*3/uL — ABNORMAL LOW (ref 150–400)
RBC: 5.65 MIL/uL — ABNORMAL HIGH (ref 3.87–5.11)
RDW: 15.1 % (ref 11.5–15.5)
WBC: 12.4 10*3/uL — ABNORMAL HIGH (ref 4.0–10.5)
nRBC: 0.2 % (ref 0.0–0.2)

## 2024-06-02 LAB — URINALYSIS, COMPLETE (UACMP) WITH MICROSCOPIC
Bilirubin Urine: NEGATIVE
Glucose, UA: NEGATIVE mg/dL
Ketones, ur: 5 mg/dL — AB
Nitrite: NEGATIVE
Protein, ur: NEGATIVE mg/dL
Specific Gravity, Urine: 1.02 (ref 1.005–1.030)
pH: 5 (ref 5.0–8.0)

## 2024-06-02 LAB — COMPREHENSIVE METABOLIC PANEL WITH GFR
ALT: 7 U/L (ref 0–44)
AST: 18 U/L (ref 15–41)
Albumin: 2.5 g/dL — ABNORMAL LOW (ref 3.5–5.0)
Alkaline Phosphatase: 34 U/L — ABNORMAL LOW (ref 38–126)
Anion gap: 11 (ref 5–15)
BUN: 61 mg/dL — ABNORMAL HIGH (ref 8–23)
CO2: 23 mmol/L (ref 22–32)
Calcium: 7.4 mg/dL — ABNORMAL LOW (ref 8.9–10.3)
Chloride: 110 mmol/L (ref 98–111)
Creatinine, Ser: 1.05 mg/dL — ABNORMAL HIGH (ref 0.44–1.00)
GFR, Estimated: 49 mL/min — ABNORMAL LOW (ref >60)
Glucose, Bld: 115 mg/dL — ABNORMAL HIGH (ref 70–99)
Potassium: 3.5 mmol/L (ref 3.5–5.1)
Sodium: 144 mmol/L (ref 135–145)
Total Bilirubin: 1 mg/dL (ref 0.0–1.2)
Total Protein: 5.6 g/dL — ABNORMAL LOW (ref 6.5–8.1)

## 2024-06-02 LAB — LIPASE, BLOOD: Lipase: 30 U/L (ref 11–51)

## 2024-06-02 LAB — TROPONIN I (HIGH SENSITIVITY)
Troponin I (High Sensitivity): 111 ng/L (ref <18)
Troponin I (High Sensitivity): 92 ng/L — ABNORMAL HIGH (ref <18)

## 2024-06-02 LAB — APTT: aPTT: 22 s — ABNORMAL LOW (ref 24–36)

## 2024-06-02 LAB — TECHNOLOGIST SMEAR REVIEW: Plt Morphology: DECREASED

## 2024-06-02 LAB — LACTIC ACID, PLASMA: Lactic Acid, Venous: 1.9 mmol/L (ref 0.5–1.9)

## 2024-06-02 LAB — LACTATE DEHYDROGENASE: LDH: 214 U/L — ABNORMAL HIGH (ref 98–192)

## 2024-06-02 LAB — PROTIME-INR
INR: 1.2 (ref 0.8–1.2)
Prothrombin Time: 15.1 s (ref 11.4–15.2)

## 2024-06-02 LAB — MAGNESIUM: Magnesium: 2.3 mg/dL (ref 1.7–2.4)

## 2024-06-02 MED ORDER — SODIUM CHLORIDE 0.9 % IV SOLN: INTRAVENOUS | Status: DC

## 2024-06-02 MED ORDER — DM-GUAIFENESIN ER 30-600 MG PO TB12: 1.0000 | ORAL_TABLET | Freq: Two times a day (BID) | ORAL | Status: DC | PRN

## 2024-06-02 MED ORDER — ENOXAPARIN SODIUM 40 MG/0.4ML IJ SOSY
40.0000 mg | PREFILLED_SYRINGE | INTRAMUSCULAR | Status: DC
  Administered 2024-06-03: 40 mg via SUBCUTANEOUS
  Filled 2024-06-02: qty 0.4

## 2024-06-02 MED ORDER — ASPIRIN 300 MG RE SUPP
300.0000 mg | Freq: Every day | RECTAL | Status: DC
Start: 2024-06-03 — End: 2024-06-03

## 2024-06-02 MED ORDER — ACETAMINOPHEN 160 MG/5ML PO SOLN: 650.0000 mg | ORAL | Status: DC | PRN

## 2024-06-02 MED ORDER — ACETAMINOPHEN 325 MG PO TABS: 650.0000 mg | ORAL_TABLET | ORAL | Status: DC | PRN

## 2024-06-02 MED ORDER — HYDRALAZINE HCL 20 MG/ML IJ SOLN: 5.0000 mg | INTRAMUSCULAR | Status: DC | PRN

## 2024-06-02 MED ORDER — SODIUM CHLORIDE 0.9 % IV BOLUS
500.0000 mL | Freq: Once | INTRAVENOUS | Status: AC
  Administered 2024-06-02: 500 mL via INTRAVENOUS

## 2024-06-02 MED ORDER — CLONAZEPAM 0.5 MG PO TABS: 0.5000 mg | ORAL_TABLET | Freq: Every evening | ORAL | Status: DC | PRN

## 2024-06-02 MED ORDER — ALBUTEROL SULFATE (2.5 MG/3ML) 0.083% IN NEBU: 2.5000 mg | INHALATION_SOLUTION | RESPIRATORY_TRACT | Status: DC | PRN

## 2024-06-02 MED ORDER — STROKE: EARLY STAGES OF RECOVERY BOOK: Freq: Once | Status: DC

## 2024-06-02 MED ORDER — ASPIRIN 325 MG PO TABS
325.0000 mg | ORAL_TABLET | Freq: Every day | ORAL | Status: DC
  Administered 2024-06-03: 325 mg via ORAL
  Filled 2024-06-02: qty 1

## 2024-06-02 MED ORDER — ATORVASTATIN CALCIUM 20 MG PO TABS
20.0000 mg | ORAL_TABLET | Freq: Every day | ORAL | Status: DC
  Administered 2024-06-03: 20 mg via ORAL
  Filled 2024-06-02: qty 1

## 2024-06-02 MED ORDER — HEPARIN BOLUS VIA INFUSION
4000.0000 [IU] | Freq: Once | INTRAVENOUS | Status: DC
  Filled 2024-06-02: qty 4000

## 2024-06-02 MED ORDER — ENSURE PLUS HIGH PROTEIN PO LIQD: 237.0000 mL | Freq: Two times a day (BID) | ORAL | Status: DC

## 2024-06-02 MED ORDER — HEPARIN (PORCINE) 25000 UT/250ML-% IV SOLN
750.0000 [IU]/h | INTRAVENOUS | Status: DC
  Filled 2024-06-02: qty 250

## 2024-06-02 MED ORDER — ASPIRIN 81 MG PO CHEW
324.0000 mg | CHEWABLE_TABLET | Freq: Once | ORAL | Status: DC
  Filled 2024-06-02: qty 4

## 2024-06-02 MED ORDER — SODIUM CHLORIDE 0.9 % IV SOLN
2.0000 g | INTRAVENOUS | Status: DC
  Administered 2024-06-03: 2 g via INTRAVENOUS
  Filled 2024-06-02: qty 20

## 2024-06-02 MED ORDER — ACETAMINOPHEN 650 MG RE SUPP: 650.0000 mg | RECTAL | Status: DC | PRN

## 2024-06-02 MED ORDER — ONDANSETRON HCL 4 MG/2ML IJ SOLN: 4.0000 mg | Freq: Three times a day (TID) | INTRAMUSCULAR | Status: DC | PRN

## 2024-06-02 MED ORDER — SENNOSIDES-DOCUSATE SODIUM 8.6-50 MG PO TABS: 1.0000 | ORAL_TABLET | Freq: Every evening | ORAL | Status: DC | PRN

## 2024-06-02 NOTE — ED Notes (Signed)
 Upon attempting an in and out catheter for this patient, it was found that she had a severely soiled brief.  There was an overwhelming ammonia scent from the brief when opened, and there was fecal matter in the brief as well.  It was noted also that the fecal matter had gotten inside of her vagina.  She has a labial tear/sore, she also is red around her genital area and bottom from constant exposure to urine.  After she was cleaned, several small sores were found on her bottom.  She also has sores beginning to form on the bony prominences on her back.  Her heels also show early signs of pressure damage.  Patient is refusing to drink for a swallow screen, or to take medication.

## 2024-06-02 NOTE — Progress Notes (Signed)
 ANTICOAGULATION CONSULT NOTE  Pharmacy Consult for heparin infusion Indication: ACS/STEMI  Allergies  Allergen Reactions   Ciprofloxacin     Penicillins     Has patient had a PCN reaction causing immediate rash, facial/tongue/throat swelling, SOB or lightheadedness with hypotension: Unknown Has patient had a PCN reaction causing severe rash involving mucus membranes or skin necrosis: Unknown Has patient had a PCN reaction that required hospitalization: Unknown Has patient had a PCN reaction occurring within the last 10 years: Unknown If all of the above answers are NO, then may proceed with Cephalosporin use.    Patient Measurements: Height: 5' 9 (175.3 cm) Weight: 59 kg (130 lb 1.1 oz) IBW/kg (Calculated) : 66.2 HEPARIN DW (KG): 59  Vital Signs: Temp: 98 F (36.7 C) (06/11 1856) Temp Source: Oral (06/11 1856) BP: 116/69 (06/11 2030) Pulse Rate: 71 (06/11 2030)  Labs: Recent Labs    06/02/24 1905 06/02/24 2033  HGB 14.9  --   HCT 48.8*  --   PLT 114*  --   CREATININE  --  1.05*  TROPONINIHS 111*  --     Estimated Creatinine Clearance: 30.5 mL/min (A) (by C-G formula based on SCr of 1.05 mg/dL (H)).   Medical History: Past Medical History:  Diagnosis Date   COPD (chronic obstructive pulmonary disease) (HCC)    Dementia (HCC)    Dislocation, shoulder closed, left, initial encounter    Hypertension     Assessment: Pt is a 89 yo female presenting to ED c/o weakness, found with elevated Troponin I level.  Goal of Therapy:  Heparin level 0.3-0.7 units/ml Monitor platelets by anticoagulation protocol: Yes   Plan:  Bolus 3500 units x 1 Start heparin infusion at 750 units/hr Will check HL in 8 hr after start of infusion CBC daily while on heparin  Aundrey Elahi S. Annisa Mazzarella, PharmD, [facility] 06/02/2024 9:43 PM

## 2024-06-02 NOTE — ED Provider Notes (Signed)
 [facility] Provider Note    Event Date/Time   First MD Initiated Contact with Patient 06/02/24 1906     (approximate)   History   Weakness  Patient to ED via [facility] from home for weakness. Son reports she has not got out of the bed for the past 4 days and had poor po intake. Upon moving pt EMS reports BP in the 60's. Alert to baseline per son. Hx dementia, HTN  90 HR 22rr 94 cbg 100% RA   HPI Molly Parks is a 89 y.o. female multiple comorbidities including CKD, hypertension, dementia, COPD, CVA, prior GI bleed presents for evaluation of weakness, AMS, decreased p.o. intake - Patient is a very limited historian, collateral gathered from son who is bedside and primary caretaker - Over the past 3-4 days patient stopped tolerating any p.o. intake, is much less interactive than usual and appears more confused than usual.  Normally is able to ambulate with a cane, change her clothes, go to the bathroom--has been bedbound for the past 3 days. - No obvious infectious symptoms.  No recent falls.  No fevers.      Physical Exam   Triage Vital Signs: ED Triage Vitals  Encounter Vitals Group     BP 06/02/24 1856 117/84     Systolic BP Percentile --      Diastolic BP Percentile --      Pulse Rate 06/02/24 1856 85     Resp 06/02/24 1856 20     Temp 06/02/24 1856 98 F (36.7 C)     Temp Source 06/02/24 1856 Oral     SpO2 06/02/24 1856 100 %     Weight 06/02/24 1855 130 lb 1.1 oz (59 kg)     Height 06/02/24 1855 5' 9 (1.753 m)     Head Circumference --      Peak Flow --      Pain Score 06/02/24 1855 0     Pain Loc --      Pain Education --      Exclude from Growth Chart --     Most recent vital signs: Vitals:   06/02/24 1856 06/02/24 2030  BP: 117/84 116/69  Pulse: 85 71  Resp: 20 18  Temp: 98 F (36.7 C)   SpO2: 100% 100%     General: Awake, no distress, very thin HEENT: Normocephalic, atraumatic CV:  Good peripheral perfusion. RRR, RP  2+ Resp:  Normal effort. CTAB Abd:  No distention. Nontender to deep palpation throughout Neuro:  Alert, unclear orientation, interactive, moves all extremities spontaneously, face symmetric   ED Results / Procedures / Treatments   Labs (all labs ordered are listed, but only abnormal results are displayed) Labs Reviewed  CBC WITH DIFFERENTIAL/PLATELET - Abnormal; Notable for the following components:      Result Value   WBC 12.4 (*)    RBC 5.65 (*)    HCT 48.8 (*)    Platelets 114 (*)    All other components within normal limits  URINALYSIS, COMPLETE (UACMP) WITH MICROSCOPIC - Abnormal; Notable for the following components:   Color, Urine YELLOW (*)    APPearance HAZY (*)    Hgb urine dipstick SMALL (*)    Ketones, ur 5 (*)    Leukocytes,Ua SMALL (*)    Bacteria, UA MANY (*)    All other components within normal limits  COMPREHENSIVE METABOLIC PANEL WITH GFR - Abnormal; Notable for the following components:   Glucose, Bld 115 (*)  BUN 61 (*)    Creatinine, Ser 1.05 (*)    Calcium 7.4 (*)    Total Protein 5.6 (*)    Albumin 2.5 (*)    Alkaline Phosphatase 34 (*)    GFR, Estimated 49 (*)    All other components within normal limits  APTT - Abnormal; Notable for the following components:   aPTT 22 (*)    All other components within normal limits  LACTATE DEHYDROGENASE - Abnormal; Notable for the following components:   LDH 214 (*)    All other components within normal limits  TROPONIN I (HIGH SENSITIVITY) - Abnormal; Notable for the following components:   Troponin I (High Sensitivity) 111 (*)    All other components within normal limits  TROPONIN I (HIGH SENSITIVITY) - Abnormal; Notable for the following components:   Troponin I (High Sensitivity) 92 (*)    All other components within normal limits  LACTIC ACID, PLASMA  LIPASE, BLOOD  MAGNESIUM  PROTIME-INR  TECHNOLOGIST SMEAR REVIEW  LIPID PANEL  HEMOGLOBIN A1C     EKG  Ecg = sinus rhythm, rate 83, no ST  elevation, no gross ST depression though diffuse T wave inversions noted in inferior and precordial leads.  Normal axis, normal intervals.  Review of prior EKGs and 4/25 and 6/24 with T wave flattening and sometimes with T wave inversions in these leads though not as consistently.   RADIOLOGY Radiology seen interpreted by myself and radiology report reviewed.  No intracranial hemorrhage.  Radiology comments on age-indeterminate infarct as well as chronic infarcts on CT head.    PROCEDURES:  Critical Care performed: No  Procedures   MEDICATIONS ORDERED IN ED: Medications  0.9 %  sodium chloride  infusion (has no administration in time range)   stroke: early stages of recovery book (has no administration in time range)  acetaminophen  (TYLENOL ) 160 MG/5ML solution 650 mg (has no administration in time range)    Or  acetaminophen  (TYLENOL ) suppository 650 mg (has no administration in time range)  senna-docusate (Senokot-S) tablet 1 tablet (has no administration in time range)  enoxaparin  (LOVENOX ) injection 40 mg (has no administration in time range)  aspirin  suppository 300 mg (has no administration in time range)    Or  aspirin  tablet 325 mg (has no administration in time range)  ondansetron  (ZOFRAN ) injection 4 mg (has no administration in time range)  hydrALAZINE  (APRESOLINE ) injection 5 mg (has no administration in time range)  albuterol (PROVENTIL) (2.5 MG/3ML) 0.083% nebulizer solution 2.5 mg (has no administration in time range)  dextromethorphan-guaiFENesin (MUCINEX DM) 30-600 MG per 12 hr tablet 1 tablet (has no administration in time range)  atorvastatin (LIPITOR) tablet 20 mg (has no administration in time range)  feeding supplement (ENSURE PLUS HIGH PROTEIN) liquid 237 mL (has no administration in time range)  clonazePAM  (KLONOPIN ) tablet 0.5 mg (has no administration in time range)  cefTRIAXone  (ROCEPHIN ) 2 g in sodium chloride  0.9 % 100 mL IVPB (has no administration in  time range)  sodium chloride  0.9 % bolus 500 mL (0 mLs Intravenous Stopped 06/02/24 2043)  sodium chloride  0.9 % bolus 500 mL (0 mLs Intravenous Stopped 06/02/24 2311)     IMPRESSION / MDM / ASSESSMENT AND PLAN / ED COURSE  I reviewed the triage vital signs and the nursing notes.                              DDX/MDM/AP: Differential diagnosis includes, but  is not limited to, dehydration, consider metabolic or infectious encephalopathy, intracranial hemorrhage, mass, worsening dementia, generalized failure to thrive in the setting of advanced age.  Will plan for broad workup.   Plan: - Labs  -Small bolus IV fluid - Chest x-ray - EKG - CT head - Ongoing goals of care discussions - Reassess  Patient's presentation is most consistent with acute presentation with potential threat to life or bodily function.  The patient is on the cardiac monitor to evaluate for evidence of arrhythmia and/or significant heart rate changes.  ED course below.  Workup with elevated troponin, downtrending on repeat, aspirin  given.  CT head with age-indeterminate infarct as well as chronic infarcts, heparin deferred.  Patient with chest pain.  Suspect type II NSTEMI.  Does have evidence of underlying UTI, treated with ceftriaxone .  Also uremia that I suspect is causing some degree of encephalopathy, suspect underlying dehydration, received total 1000 cc IV fluid in emergency department.  Admitted to hospitalist service.  Clinical Course as of 06/02/24 2342  Wed Jun 02, 2024  1958 CBC with mild leukocytosis, mild thrombocytopenia [MM]  2007 Lactate wnl [MM]  2058 CMP reviewed, unremarkable Lipase normal [MM]  2058 Troponin notably elevated at 111 [MM]  2058 CTH: IMPRESSION: 1. Negative for hemorrhage or mass. Age indeterminate small cortical/subcortical infarct at the right cranial vertex. 2. Atrophy and chronic small vessel ischemic changes of the white matter. Chronic right cerebellar and occipital  infarcts.   [MM]  2059 CXR: IMPRESSION: No active disease.   [MM]  2136 Spoke w/ son, Ellard Gunning, is amenable to admission, heparin would be within goals of care.   Full CODE.  [MM]  2139 Suspect type II NSTEMI in the setting of likely dehydration and possible underlying infection, no urine collected yet, will perform straight cath.    Repeat troponin pending.  Giving more IV fluid.  Suspect worsening uremia likely contributing to altered mental status, suspect secondary to dehydration. [MM]  2141 Hospitalist consult order placed. [MM]    Clinical Course User Index [MM] Collis Deaner, MD     FINAL CLINICAL IMPRESSION(S) / ED DIAGNOSES   Final diagnoses:  Weakness  Altered mental status, unspecified altered mental status type  Elevated troponin     Rx / DC Orders   ED Discharge Orders     None        Note:  This document was prepared using Dragon voice recognition software and may include unintentional dictation errors.   Collis Deaner, MD 06/02/24 2342

## 2024-06-02 NOTE — ED Triage Notes (Signed)
 Patient to ED via [facility] from home for weakness. Son reports she has not got out of the bed for the past 4 days and had poor po intake. Upon moving pt EMS reports BP in the 60's. Alert to baseline per son. Hx dementia, HTN  90 HR 22rr 94 cbg 100% RA

## 2024-06-02 NOTE — H&P (Addendum)
 History and Physical    Molly Parks:[id number] DOB: [birth year] DOA: 06/02/2024  Referring MD/NP/PA:   PCP: Baltazar Leventhal, MD   Patient coming from:  The patient is coming from home.     Chief Complaint: weakness and altered mental status  HPI: Molly Parks is a 89 y.o. female with medical history significant of dementia, hypertension, COPD, anxiety, UTI, who presents with weakness, altered mental status.  Per her son (I called her son by phone), at her normal baseline, patient is able to ambulate with a cane, change her clothes, go to the bathroom, recognizes family's, knows where she she is living, but is usually not orientated to the time.  In the past 4 days, patient developed generalized weakness, with very poor appetite, stopped eating food.  She is more confused than her baseline.  When saw patient in ED, patient is very lethargic, pt knows her own name, not orientated to the place and time.  She moves all extremities.  No facial droop or slurred speech.  No active nausea, vomiting, diarrhea noted.  No active respiratory distress, cough, SOB noted.  Does not seem to have chest pain or abdominal pain.  Not sure if patient has symptoms of UTI. Per report, her SBP was in 60s by EMS. Her Bp is 116/69 in ED.    Data reviewed independently and ED Course: pt was found to have WBC 12.4, renal function close to baseline, troponin 111 --> 92, lactic acid 1.9, platelet 114.  Temperature normal, blood pressure 116/69, heart rate 85, RR 20, oxygen saturation 100% on room air.  Chest x-ray negative.  Patient is placed in the telemetry bed for elevation.  CT of head: 1. Negative for hemorrhage or mass. Age indeterminate small cortical/subcortical infarct at the right cranial vertex. 2. Atrophy and chronic small vessel ischemic changes of the white matter. Chronic right cerebellar and occipital infarcts.   EKG: I have personally reviewed.  Sinus rhythm, QTc 465, diffuse T wave  inversion.   Review of Systems: Could not be reviewed due to dementia and altered mental status.   Allergy:  Allergies  Allergen Reactions   Ciprofloxacin     Penicillins     Has patient had a PCN reaction causing immediate rash, facial/tongue/throat swelling, SOB or lightheadedness with hypotension: Unknown Has patient had a PCN reaction causing severe rash involving mucus membranes or skin necrosis: Unknown Has patient had a PCN reaction that required hospitalization: Unknown Has patient had a PCN reaction occurring within the last 10 years: Unknown If all of the above answers are NO, then may proceed with Cephalosporin use.    Past Medical History:  Diagnosis Date   COPD (chronic obstructive pulmonary disease) (HCC)    Dementia (HCC)    Dislocation, shoulder closed, left, initial encounter    Hypertension     Past Surgical History:  Procedure Laterality Date   OOPHORECTOMY Right     Social History:  reports that she has never smoked. She has never used smokeless tobacco. She reports that she does not drink alcohol and does not use drugs.  Family History:  Family History  Problem Relation Age of Onset   Pulmonary embolism Sister      Prior to Admission medications   Medication Sig Start Date End Date Taking? Authorizing Provider  amLODipine  (NORVASC ) 5 MG tablet Take 1 tablet (5 mg total) by mouth daily. 01/02/19   Jacquie Maudlin, MD  aspirin  325 MG EC tablet Take 325 mg daily by  mouth.    [provider]  bisacodyl  (DULCOLAX) 5 MG EC tablet Take 1 tablet (5 mg total) daily as needed by mouth for moderate constipation. 11/05/17   Renold Cashing, MD  clonazePAM  (KLONOPIN ) 0.5 MG tablet Take 0.5 tablets (0.25 mg total) by mouth 2 (two) times daily as needed. 03/11/18   Altha Athens, MD  metoprolol  tartrate (LOPRESSOR ) 25 MG tablet Take 0.5 tablets (12.5 mg total) 2 (two) times daily by mouth. 11/05/17   Renold Cashing, MD  polyethylene glycol (MIRALAX  / GLYCOLAX )  packet Take 17 g by mouth daily as needed for mild constipation. 03/11/18   Altha Athens, MD    Physical Exam: Vitals:   06/02/24 1855 06/02/24 1856 06/02/24 2030 06/03/24 0014  BP:  117/84 116/69   Pulse:  85 71   Resp:  20 18   Temp:  98 F (36.7 C)  98.1 F (36.7 C)  TempSrc:  Oral  Oral  SpO2:  100% 100%   Weight: 59 kg     Height: 5' 9 (1.753 m)      General: Not in acute distress.  Dry mucous membrane, thin body habitus HEENT:       Eyes: PERRL, EOMI, no jaundice       ENT: No discharge from the ears and nose       Neck: No JVD, no bruit, no mass felt. Heme: No neck lymph node enlargement. Cardiac: S1/S2, RRR, No murmurs, No gallops or rubs. Respiratory: No rales, wheezing, rhonchi or rubs. GI: Soft, nondistended, nontender, no organomegaly, BS present. GU: No hematuria Ext: No pitting leg edema bilaterally. 1+DP/PT pulse bilaterally. Musculoskeletal: No joint deformities, No joint redness or warmth, no limitation of ROM in spin. Skin: has sacral ulcer with skin tears    Neuro: Patient is confused, knows her own name, not oriented to the place and the time. Cranial nerves II-XII grossly intact, moves all extremities. Psych: Patient is not psychotic, no suicidal or hemocidal ideation.  Labs on Admission: I have personally reviewed following labs and imaging studies  CBC: Recent Labs  Lab 06/02/24 1905  WBC 12.4*  NEUTROABS 7.4  HGB 14.9  HCT 48.8*  MCV 86.4  PLT 114*   Basic Metabolic Panel: Recent Labs  Lab 06/02/24 2033  NA 144  K 3.5  CL 110  CO2 23  GLUCOSE 115*  BUN 61*  CREATININE 1.05*  CALCIUM 7.4*  MG 2.3   GFR: Estimated Creatinine Clearance: 30.5 mL/min (A) (by C-G formula based on SCr of 1.05 mg/dL (H)). Liver Function Tests: Recent Labs  Lab 06/02/24 2033  AST 18  ALT 7  ALKPHOS 34*  BILITOT 1.0  PROT 5.6*  ALBUMIN 2.5*   Recent Labs  Lab 06/02/24 2033  LIPASE 30   No results for input(s): AMMONIA in the last 168  hours. Coagulation Profile: Recent Labs  Lab 06/02/24 2201  INR 1.2   Cardiac Enzymes: No results for input(s): CKTOTAL, CKMB, CKMBINDEX, TROPONINI in the last 168 hours. BNP (last 3 results) No results for input(s): PROBNP in the last 8760 hours. HbA1C: No results for input(s): HGBA1C in the last 72 hours. CBG: No results for input(s): GLUCAP in the last 168 hours. Lipid Profile: No results for input(s): CHOL, HDL, LDLCALC, TRIG, CHOLHDL, LDLDIRECT in the last 72 hours. Thyroid Function Tests: No results for input(s): TSH, T4TOTAL, FREET4, T3FREE, THYROIDAB in the last 72 hours. Anemia Panel: No results for input(s): VITAMINB12, FOLATE, FERRITIN, TIBC, IRON, RETICCTPCT in the last 72  hours. Urine analysis:    Component Value Date/Time   COLORURINE YELLOW (A) 06/02/2024 2201   APPEARANCEUR HAZY (A) 06/02/2024 2201   APPEARANCEUR Hazy 11/14/2012 2046   LABSPEC 1.020 06/02/2024 2201   LABSPEC 1.021 11/14/2012 2046   PHURINE 5.0 06/02/2024 2201   GLUCOSEU NEGATIVE 06/02/2024 2201   GLUCOSEU Negative 11/14/2012 2046   HGBUR SMALL (A) 06/02/2024 2201   BILIRUBINUR NEGATIVE 06/02/2024 2201   BILIRUBINUR Negative 11/14/2012 2046   KETONESUR 5 (A) 06/02/2024 2201   PROTEINUR NEGATIVE 06/02/2024 2201   NITRITE NEGATIVE 06/02/2024 2201   LEUKOCYTESUR SMALL (A) 06/02/2024 2201   LEUKOCYTESUR Negative 11/14/2012 2046   Sepsis Labs: @LABRCNTIP (procalcitonin:4,lacticidven:4) )No results found for this or any previous visit (from the past 240 hours).   Radiological Exams on Admission:   Assessment/Plan Principal Problem:   Acute metabolic encephalopathy Active Problems:   Stroke [facility])   Myocardial injury   Dehydration   Dementia without behavioral disturbance (HCC)   Leukocytosis   Essential hypertension   Chronic kidney disease, stage 3b (HCC)   COPD (chronic obstructive pulmonary disease) (HCC)   Anxiety    Thrombocytopenia (HCC)   Sacral ulcer (HCC)   Protein-calorie malnutrition, moderate (HCC)   Assessment and Plan:  Acute metabolic encephalopathy: Etiology is not clear.  CT of head showed age indeterminate small cortical/subcortical infarct at the right cranial vertex. Wil get MRI of brain to r/o stroke. If confirmed for stroke, will need to do full stroke workup.  - Placed on tele bed for observation - Obtain MRI-brain  - continue ASA  - Statin: start lipitor 20 mg daily empirically - fasting lipid panel and HbA1c  - swallowing screen. If fails, will get SLP - PT/OT consult - F/u UA.   Addendum:   Stroke: MRI showed acute stroke. 4 mm acute ischemic nonhemorrhagic cortical infarct involving the high posterior right parietal lobe.  -will get 2d echo - will get CTA of head and neck.  2. UTI: UA positive with hazy appearance, small amount of leukocyte, many bacteria, WBC 11-20, indicating UTI - Started Rocephin  - Follow-up urine culture   Myocardial injury: trop  111 --> 92, does not seem to have chest pain.  Likely demand ischemia. -Aspirin  and Lipitor as above - Trend troponin  Dehydration: -IV fluid: 500 cc normal saline x 2 in the ED - 75 cc/h normal saline  Dementia without behavioral disturbance (HCC) -fall precaution  Leukocytosis: WBC 12.4, no clear source of infection identified.  No fever. -Follow-up with UA  Essential hypertension -IV hydralazine  as needed for SBP> 220 or DBP> 110 - Hold amlodipine  and metoprolol  for permissive hypertension  Chronic kidney disease, stage 3b (HCC): Renal function close to baseline.  Recent baseline creatinine 0.8-1.0.  Her creatinine is 1.05, BUN 61, GFR 49 -Follow-up by BMP  COPD (chronic obstructive pulmonary disease) (HCC): No wheezing or shortness of breath.  Stable -As needed albuterol and Mucinex  Anxiety -Continue home as needed Klonopin   Thrombocytopenia [facility]): Platelet 114 -Check LDH and peripheral  smear  Sacral ulcer (HCC) -Wound care consult  Protein-calorie malnutrition, moderate (HCC): Body weight 59 kg, BMI 19.21 - Nutrition consult - Ensure        DVT ppx: SQ Lovenox   Code Status: Full code per her son  Family Communication:   Yes, patient's son by phone    Disposition Plan:  Anticipate discharge back to previous environment  Consults called:  none  Admission status and Level of care: Telemetry Cardiac:  for obs     Dispo: The patient is from: Home              Anticipated d/c is to: Home              Anticipated d/c date is: 1 day              Patient currently is not medically stable to d/c.    Severity of Illness:  The appropriate patient status for this patient is OBSERVATION. Observation status is judged to be reasonable and necessary in order to provide the required intensity of service to ensure the patient's safety. The patient's presenting symptoms, physical exam findings, and initial radiographic and laboratory data in the context of their medical condition is felt to place them at decreased risk for further clinical deterioration. Furthermore, it is anticipated that the patient will be medically stable for discharge from the hospital within 2 midnights of admission.        Date of Service 06/03/2024    Fidencio Hue [facility]   If 7PM-7AM, please contact night-coverage [email or web address] 06/03/2024, 12:53 AM

## 2024-06-03 ENCOUNTER — Observation Stay

## 2024-06-03 ENCOUNTER — Observation Stay: Admit: 2024-06-03

## 2024-06-03 ENCOUNTER — Observation Stay (HOSPITAL_BASED_OUTPATIENT_CLINIC_OR_DEPARTMENT_OTHER)
Admit: 2024-06-03 | Discharge: 2024-06-03 | Disposition: A | Discharge status: Home / Self Care | Attending: Internal Medicine | Admitting: Internal Medicine

## 2024-06-03 DIAGNOSIS — R7989 Other specified abnormal findings of blood chemistry: Secondary | ICD-10-CM | POA: Diagnosis not present

## 2024-06-03 DIAGNOSIS — I6389 Other cerebral infarction: Secondary | ICD-10-CM

## 2024-06-03 DIAGNOSIS — R531 Weakness: Secondary | ICD-10-CM | POA: Diagnosis not present

## 2024-06-03 DIAGNOSIS — I639 Cerebral infarction, unspecified: Secondary | ICD-10-CM

## 2024-06-03 DIAGNOSIS — G9341 Metabolic encephalopathy: Secondary | ICD-10-CM | POA: Diagnosis not present

## 2024-06-03 DIAGNOSIS — N390 Urinary tract infection, site not specified: Secondary | ICD-10-CM | POA: Diagnosis not present

## 2024-06-03 DIAGNOSIS — L98421 Non-pressure chronic ulcer of back limited to breakdown of skin: Secondary | ICD-10-CM

## 2024-06-03 LAB — ECHOCARDIOGRAM COMPLETE
AR max vel: 2.79 cm2
AV Area VTI: 3.54 cm2
AV Area mean vel: 3.01 cm2
AV Mean grad: 1 mmHg
AV Peak grad: 2.3 mmHg
Ao pk vel: 0.76 m/s
Area-P 1/2: 2.37 cm2
Height: 69 in
S' Lateral: 1.8 cm
Weight: 2081.14 [oz_av]

## 2024-06-03 LAB — LIPID PANEL
Cholesterol: 161 mg/dL (ref 0–200)
HDL: 38 mg/dL — ABNORMAL LOW (ref >40)
LDL Cholesterol: 102 mg/dL — ABNORMAL HIGH (ref 0–99)
Total CHOL/HDL Ratio: 4.2 ratio
Triglycerides: 103 mg/dL (ref <150)
VLDL: 21 mg/dL (ref 0–40)

## 2024-06-03 MED ORDER — IOHEXOL 350 MG/ML SOLN
75.0000 mL | Freq: Once | INTRAVENOUS | Status: AC | PRN
Start: 2024-06-03 — End: 2024-06-03
  Administered 2024-06-03: 75 mL via INTRAVENOUS

## 2024-06-03 MED ORDER — CEPHALEXIN 500 MG PO CAPS: 500.0000 mg | ORAL_CAPSULE | Freq: Three times a day (TID) | ORAL | 0 refills | Status: AC

## 2024-06-03 MED ORDER — HYDRALAZINE HCL 20 MG/ML IJ SOLN: 5.0000 mg | INTRAMUSCULAR | Status: DC | PRN

## 2024-06-03 MED ORDER — ENSURE PLUS HIGH PROTEIN PO LIQD: 237.0000 mL | Freq: Two times a day (BID) | ORAL | 2 refills | Status: DC

## 2024-06-03 MED ORDER — ATORVASTATIN CALCIUM 20 MG PO TABS: 20.0000 mg | ORAL_TABLET | Freq: Every day | ORAL | 0 refills | Status: DC

## 2024-06-03 NOTE — Progress Notes (Signed)
 [facility] Liaison Note  Received request from [facility], Rozelle Corning, RN, Transitions of Care Manager, for hospice services at home after discharge.  Spoke with son  to initiate education related to hospice philosophy, services, and team approach to care. Son verbalized understanding of information given.  DME needs discussed.  Patient has the following equipment in the home: N/A  Patient/family requests the following equipment in the home: Son requested that hospice nurse evaluate for DME needs after patient returns home.    The address has been verified and is correct in the chart.   Please send signed and completed DNR home with the patient/family.  Please provide prescriptions at discharge as needed to ensure ongoing symptom management.   [facility] information and contact numbers given to son.   Above information shared with Rozelle Corning, RN, Transitions of Care Manager.     Please call with any Hospice related questions or concerns.  Thank you for the opportunity to participate in this patient's care.  Helmut Lobe, [facility] Liaison [phone]

## 2024-06-03 NOTE — Evaluation (Signed)
 Physical Therapy Evaluation Patient Details Name: Molly Parks MRN: [id number] DOB: 1928/09/30 Today's Date: 06/03/2024  History of Present Illness  Pt is 89 y/o admitted to hospital with new 4 mm acute ischemic nonhemorrhagic cortical infarct involving the  high posterior right parietal lobe.  Clinical Impression  Patient received in bed, she is alert. HOH/confused. Having difficulty following direction and is unable to provide history. She is able to state name and that she is in hospital. Patient requires total +2 assist for bed mobility demonstrating zero sitting balance once at edge of bed. Requesting to lie back down upon sitting up. Patient will continue to benefit from skilled PT to improve mobility and independence.         If plan is discharge home, recommend the following: Two people to help with walking and/or transfers;Two people to help with bathing/dressing/bathroom   Can travel by private vehicle   No    Equipment Recommendations None recommended by PT;Other (comment) (TBD next venue)  Recommendations for Other Services       Functional Status Assessment Patient has had a recent decline in their functional status and demonstrates the ability to make significant improvements in function in a reasonable and predictable amount of time.     Precautions / Restrictions Precautions Precautions: Fall Recall of Precautions/Restrictions: Impaired Restrictions Weight Bearing Restrictions Per Provider Order: No      Mobility  Bed Mobility Overal bed mobility: Needs Assistance Bed Mobility: Supine to Sit, Sit to Supine     Supine to sit: Max assist, +2 for physical assistance, HOB elevated, Used rails Sit to supine: Max assist, +2 for physical assistance        Transfers                   General transfer comment: Pt refusal and resistive    Ambulation/Gait               General Gait Details: unable  Stairs            Wheelchair Mobility      Tilt Bed    Modified Rankin (Stroke Patients Only)       Balance Overall balance assessment: Needs assistance Sitting-balance support: Feet supported Sitting balance-Leahy Scale: Zero Sitting balance - Comments: max A with pt pushing back towards therapist                                     Pertinent Vitals/Pain Pain Assessment Pain Assessment: No/denies pain    Home Living Family/patient expects to be discharged to:: Unsure                   Additional Comments: No family present at time of eval. Per chart, she ambulates with a cane at baseline. Patient unable to provide history. HOH/confusion    Prior Function                 ADLs Comments: Pt unable to verbalizes secondary to cognition..     Extremity/Trunk Assessment   Upper Extremity Assessment Upper Extremity Assessment: Defer to OT evaluation    Lower Extremity Assessment Lower Extremity Assessment: Generalized weakness;Difficult to assess due to impaired cognition    Cervical / Trunk Assessment Cervical / Trunk Assessment: Kyphotic  Communication   Communication Communication: Impaired Factors Affecting Communication: Hearing impaired    Cognition Arousal: Alert Behavior During Therapy: Flat affect   PT -  Cognitive impairments: History of cognitive impairments, No family/caregiver present to determine baseline                         Following commands: Impaired Following commands impaired: Follows one step commands inconsistently     Cueing Cueing Techniques: Verbal cues, Gestural cues, Tactile cues, Visual cues     General Comments      Exercises     Assessment/Plan    PT Assessment Patient needs continued PT services  PT Problem List Decreased strength;Decreased activity tolerance;Decreased balance;Decreased mobility;Decreased cognition       PT Treatment Interventions Functional mobility training;Therapeutic activities;Therapeutic  exercise;Patient/family education    PT Goals (Current goals can be found in the Care Plan section)  Acute Rehab PT Goals PT Goal Formulation: Patient unable to participate in goal setting Time For Goal Achievement: 06/17/24    Frequency Min 2X/week     Co-evaluation PT/OT/SLP Co-Evaluation/Treatment: Yes Reason for Co-Treatment: For patient/therapist safety;Necessary to address cognition/behavior during functional activity;To address functional/ADL transfers PT goals addressed during session: Mobility/safety with mobility;Balance         AM-PAC PT 6 Clicks Mobility  Outcome Measure Help needed turning from your back to your side while in a flat bed without using bedrails?: Total Help needed moving from lying on your back to sitting on the side of a flat bed without using bedrails?: Total Help needed moving to and from a bed to a chair (including a wheelchair)?: Total Help needed standing up from a chair using your arms (e.g., wheelchair or bedside chair)?: Total Help needed to walk in hospital room?: Total Help needed climbing 3-5 steps with a railing? : Total 6 Click Score: 6    End of Session   Activity Tolerance: Other (comment) (limited by poor cognition) Patient left: in bed;with call bell/phone within reach Nurse Communication: Mobility status PT Visit Diagnosis: Other abnormalities of gait and mobility (R26.89);Muscle weakness (generalized) (M62.81)    Time: 5409-8119 PT Time Calculation (min) (ACUTE ONLY): 8 min   Charges:   PT Evaluation $PT Eval Moderate Complexity: 1 Mod   PT General Charges $$ ACUTE PT VISIT: 1 Visit         Coraima Tibbs, PT, GCS 06/03/24,11:24 AM

## 2024-06-03 NOTE — Progress Notes (Signed)
*  PRELIMINARY RESULTS* Echocardiogram 2D Echocardiogram has been performed.  Broadus Canes 06/03/2024, 2:37 PM

## 2024-06-03 NOTE — Hospital Course (Addendum)
 Taken from H&P.  AMI Molly Parks is a 89 y.o. female with medical history significant of dementia, hypertension, COPD, anxiety, UTI, who presents with weakness, altered mental status.   In the past 4 days, patient developed generalized weakness, with very poor appetite, stopped eating food.  She is more confused than her baseline.   Patient was very lethargic on presentation and unable to participate with any meaningful history.  Vitals seem stable.  Labs with leukocytosis at 12.4, troponin 111>> 92, rest of the labs around baseline.  UA concerning for UTI. Initial CT head was negative for any acute abnormalities but did show age-indeterminate small cortical/subcortical infarcts in the right cranial vertex along with atrophy and chronic small vessel ischemic changes.  Chronic right cerebellar and occipital infarcts. EKG with NSR, QTc 465, diffuse T wave inversions. Patient was also noted to have small sacral skin tears on admission.  Stroke workup ordered and she was started on ceftriaxone  for concern of UTI.  6/12: Vital stable, MRI brain with acute small stroke involving the high posterior right parietal lobe.  CTA head and neck negative for LVO.  Also shows mild patchy groundglass density within the visualized right upper lobe suspicious for mild infection/pneumonia.  Lipid panel with LDL of 102, PT is recommending SNF Echocardiogram done with pending results Urine cultures pending.  Patient with improved mentation and close to baseline per her son.  Overall declining health and more failure to thrive picture with advanced dementia.  She need continuous reminding to swallow if food is placed in her mouth.  Had a lengthy goals of care discussion with son and he wants to keep her home with hospice help which is appropriate.  Hospice was consulted and they will follow-up at home for further needs and equipments.  Patient had multiple prior urine cultures and each time with multiple flora.  She is  unable to explain any urinary symptoms.  Cultures are pending, she received ceftriaxone  while in the hospital and is being discharged on 4 more days of Keflex to complete the course of antibiotic for concern of UTI.  She was also started on statin and family can decide whether to continue or not based on goals of care discussion.  She will continue the rest of her home medications and follow-up with her providers for further assistance. Hospice services will follow at home for further assistance.

## 2024-06-03 NOTE — ED Notes (Signed)
 Discharge instructions reviewed with patient's son, Rosary Como. Understanding of all discharge instructions voiced.

## 2024-06-03 NOTE — TOC Initial Note (Signed)
 Transition of Care [facility]) - Initial/Assessment Note    Patient Details  Name: Molly Parks MRN: [id number] Date of Birth: Feb 13, 1928  Transition of Care [facility]) CM/SW Contact:    Molly Halo, RN Phone Number: 06/03/2024, 2:03 PM  Clinical Narrative:                  TOC spoke with the patient's son Molly Parks [phone] on the phone and advised that Therapy is recommending SNF. Per the son, his preference is for his mom to return to home with hospice support. TOC gave the son choice, but he advised he didn't have a preference. TOC sent referral to [facility] with [facility]. Molly Parks will review and report back.        Patient Goals and CMS Choice            Expected Discharge Plan and Services                                              Prior Living Arrangements/Services                       Activities of Daily Living      Permission Sought/Granted                  Emotional Assessment              Admission diagnosis:  Acute metabolic encephalopathy [G93.41] Stroke [facility]) [I63.9] Patient Active Problem List   Diagnosis Date Noted   Stroke (HCC) 06/03/2024   Protein-calorie malnutrition, moderate (HCC) 06/02/2024   Myocardial injury 06/02/2024   Dementia without behavioral disturbance (HCC) 06/02/2024   Chronic kidney disease, stage 3b (HCC) 06/02/2024   Dehydration 06/02/2024   COPD (chronic obstructive pulmonary disease) (HCC) 06/02/2024   Anxiety 06/02/2024   Leukocytosis 06/02/2024   Thrombocytopenia (HCC) 06/02/2024   Acute metabolic encephalopathy 06/02/2024   Sacral ulcer (HCC) 06/02/2024   AKI (acute kidney injury) (HCC) 08/14/2022   Weakness 08/14/2022   Essential hypertension 08/14/2022   CKD (chronic kidney disease) stage 3, GFR 30-59 ml/min (HCC) 08/14/2022   Cognitive decline 08/14/2022   Nausea 08/14/2022   Hypokalemia 08/14/2022   GIB (gastrointestinal bleeding) 03/10/2018   Rhabdomyolysis 11/03/2017   PCP:   Molly Leventhal, MD Pharmacy:   [facility] [phone] Molly Parks, [place] - [address] AT [facility] [address] [facility] [place] [address] Phone: [phone] Fax: [phone]     Social Drivers of Health (SDOH) Social History: SDOH Screenings   Tobacco Use: Low Risk  (06/02/2024)   SDOH Interventions:     Readmission Risk Interventions     No data to display

## 2024-06-03 NOTE — Discharge Summary (Signed)
 Physician Discharge Summary   Patient: Molly Parks MRN: [id number] DOB: [birth year]  Admit date:     06/02/2024  Discharge date: 06/03/24  Discharge Physician: Luna Salinas   PCP: Baltazar Leventhal, MD   Recommendations at discharge:  Please follow-up final urine culture results Patient is being discharged home with hospice health, no current symptoms except for failure to thrive and progressive decline at this time Follow-up with primary care provider  Discharge Diagnoses: Principal Problem:   Acute metabolic encephalopathy Active Problems:   Stroke [facility])   Myocardial injury   Dehydration   Dementia without behavioral disturbance (HCC)   Leukocytosis   Essential hypertension   Chronic kidney disease, stage 3b (HCC)   COPD (chronic obstructive pulmonary disease) (HCC)   Anxiety   Thrombocytopenia (HCC)   Sacral ulcer (HCC)   Protein-calorie malnutrition, moderate (HCC)   Elevated troponin   Urinary tract infection without hematuria   Hospital Course: Taken from H&P.  Molly Parks is a 89 y.o. female with medical history significant of dementia, hypertension, COPD, anxiety, UTI, who presents with weakness, altered mental status.   In the past 4 days, patient developed generalized weakness, with very poor appetite, stopped eating food.  She is more confused than her baseline.   Patient was very lethargic on presentation and unable to participate with any meaningful history.  Vitals seem stable.  Labs with leukocytosis at 12.4, troponin 111>> 92, rest of the labs around baseline.  UA concerning for UTI. Initial CT head was negative for any acute abnormalities but did show age-indeterminate small cortical/subcortical infarcts in the right cranial vertex along with atrophy and chronic small vessel ischemic changes.  Chronic right cerebellar and occipital infarcts. EKG with NSR, QTc 465, diffuse T wave inversions. Patient was also noted to have small sacral skin tears on  admission.  Stroke workup ordered and she was started on ceftriaxone  for concern of UTI.  6/12: Vital stable, MRI brain with acute small stroke involving the high posterior right parietal lobe.  CTA head and neck negative for LVO.  Also shows mild patchy groundglass density within the visualized right upper lobe suspicious for mild infection/pneumonia.  Lipid panel with LDL of 102, PT is recommending SNF Echocardiogram done with pending results Urine cultures pending.  Patient with improved mentation and close to baseline per her son.  Overall declining health and more failure to thrive picture with advanced dementia.  She need continuous reminding to swallow if food is placed in her mouth.  Had a lengthy goals of care discussion with son and he wants to keep her home with hospice help which is appropriate.  Hospice was consulted and they will follow-up at home for further needs and equipments.  Patient had multiple prior urine cultures and each time with multiple flora.  She is unable to explain any urinary symptoms.  Cultures are pending, she received ceftriaxone  while in the hospital and is being discharged on 4 more days of Keflex to complete the course of antibiotic for concern of UTI.  She was also started on statin and family can decide whether to continue or not based on goals of care discussion.  She will continue the rest of her home medications and follow-up with her providers for further assistance. Hospice services will follow at home for further assistance.  Consultants: None Procedures performed: None Disposition: Hospice care Diet recommendation:  Discharge Diet Orders (From admission, onward)     Start     Ordered  06/03/24 0000  Diet - low sodium heart healthy        06/03/24 1453           Regular diet DISCHARGE MEDICATION: Allergies as of 06/03/2024       Reactions   Ciprofloxacin     Penicillins    Has patient had a PCN reaction causing immediate rash,  facial/tongue/throat swelling, SOB or lightheadedness with hypotension: Unknown Has patient had a PCN reaction causing severe rash involving mucus membranes or skin necrosis: Unknown Has patient had a PCN reaction that required hospitalization: Unknown Has patient had a PCN reaction occurring within the last 10 years: Unknown If all of the above answers are NO, then may proceed with Cephalosporin use.        Medication List     TAKE these medications    amLODipine  5 MG tablet Commonly known as: NORVASC  Take 1 tablet (5 mg total) by mouth daily.   aspirin  EC 325 MG tablet Take 325 mg daily by mouth.   atorvastatin 20 MG tablet Commonly known as: LIPITOR Take 1 tablet (20 mg total) by mouth daily. Start taking on: June 04, 2024   bisacodyl  5 MG EC tablet Commonly known as: DULCOLAX Take 1 tablet (5 mg total) daily as needed by mouth for moderate constipation.   cephALEXin 500 MG capsule Commonly known as: KEFLEX Take 1 capsule (500 mg total) by mouth 3 (three) times daily for 4 days.   clonazePAM  0.5 MG tablet Commonly known as: KLONOPIN  Take 0.5 tablets (0.25 mg total) by mouth 2 (two) times daily as needed. What changed:  when to take this reasons to take this   feeding supplement Liqd Take 237 mLs by mouth 2 (two) times daily between meals.   metoprolol  tartrate 25 MG tablet Commonly known as: LOPRESSOR  Take 0.5 tablets (12.5 mg total) 2 (two) times daily by mouth.   polyethylene glycol 17 g packet Commonly known as: MIRALAX  / GLYCOLAX  Take 17 g by mouth daily as needed for mild constipation.   potassium chloride  10 MEQ tablet Commonly known as: KLOR-CON  Take 10 mEq by mouth daily.               Discharge Care Instructions  (From admission, onward)           Start     Ordered   06/03/24 0000  Discharge wound care:       Comments: Cleanse sacrum/buttocks with soap and water, dry and apply Xeroform gauze Timm Foot [phone]) to open areas daily and  secure with silicone foam or ABD pad and tape whichever is preferred.   06/03/24 1453            Follow-up Information     Derrick Fling Dianna Fortis, MD. Schedule an appointment as soon as possible for a visit in 1 week(s).   Specialty: Family Medicine Contact information: [address] [place] [address] [phone]                Discharge Exam: Cleavon Curls Weights   06/02/24 1855  Weight: 59 kg   General.  Frail and malnourished elderly lady, in no acute distress. Pulmonary.  Lungs clear bilaterally, normal respiratory effort. CV.  Regular rate and rhythm, no JVD, rub or murmur. Abdomen.  Soft, nontender, nondistended, BS positive. CNS.  Alert and oriented to name only.  No focal neurologic deficit. Extremities.  No edema, pulses intact and symmetrical.  Condition at discharge: stable  The results of significant diagnostics from this hospitalization (  including imaging, microbiology, ancillary and laboratory) are listed below for reference.   Imaging Studies: CT ANGIO HEAD NECK W WO CM (CODE STROKE) Result Date: 06/03/2024 CLINICAL DATA:  Follow-up examination for acute stroke. EXAM: CT ANGIOGRAPHY HEAD AND NECK WITH AND WITHOUT CONTRAST TECHNIQUE: Multidetector CT imaging of the head and neck was performed using the standard protocol during bolus administration of intravenous contrast. Multiplanar CT image reconstructions and MIPs were obtained to evaluate the vascular anatomy. Carotid stenosis measurements (when applicable) are obtained utilizing NASCET criteria, using the distal internal carotid diameter as the denominator. RADIATION DOSE REDUCTION: This exam was performed according to the departmental dose-optimization program which includes automated exposure control, adjustment of the mA and/or kV according to patient size and/or use of iterative reconstruction technique. CONTRAST:  75mL OMNIPAQUE  IOHEXOL  350 MG/ML SOLN COMPARISON:  CT and MRI from 06/02/2024.  FINDINGS: With standard branch pattern. Mild aortic atherosclerosis. No significant stenosis about the origin the great vessels. CTA NECK FINDINGS Aortic arch: Visualized aortic arch within normal limits for caliber Right carotid system: Right common and internal carotid arteries are patent without dissection. Mild atheromatous change about the right carotid bulb without hemodynamically significant greater than 50% stenosis. Left carotid system: Left common and internal carotid arteries are patent without dissection. Mild atheromatous change about the left carotid bulb without hemodynamically significant greater than 50% stenosis. Vertebral arteries: Both vertebral arteries arise from subclavian arteries. Proximal left vertebral artery not well assessed due to adjacent venous contamination. Visualized portions of the vertebral arteries patent without stenosis or dissection. Skeleton: No worrisome osseous lesions. Moderate multilevel cervical spondylosis. Osteoarthritic changes noted about the C1-2 articulations. Poor dentition noted. Other neck: No other acute finding. Upper chest: Scattered patchy ground-glass densities within the visualized right upper lobe, most pronounced at the posterior right lung apex (series 4, image 135), suspicious for mild infection/pneumonia. Review of the MIP images confirms the above findings CTA HEAD FINDINGS Anterior circulation: Evaluation of the intracranial circulation mildly limited by timing of contrast bolus. Both internal carotid arteries are patent to the termini without stenosis. A1 segments patent bilaterally. Normal anterior communicating artery complex. Anterior cerebral arteries patent without stenosis. No M1 stenosis or occlusion. Distal MCA branches grossly perfused and symmetric. Posterior circulation: Both V4 segments patent without significant stenosis. Both PICA patent at their origins. Basilar patent without stenosis. Superior cerebellar and posterior cerebral  arteries patent bilaterally. Venous sinuses: Not well assessed due to timing the contrast bolus. Anatomic variants: None significant.  No visible aneurysm. Review of the MIP images confirms the above findings IMPRESSION: 1. Negative CTA for large vessel occlusion or other emergent finding. 2. Mild atheromatous change about the carotid bifurcations without hemodynamically significant stenosis. 3. Mild patchy ground-glass density within the visualized right upper lobe, suspicious for mild infection/pneumonia. 4.  Aortic Atherosclerosis (ICD10-I70.0). Electronically Signed   By: Virgia Griffins M.D.   On: 06/03/2024 03:22   MR BRAIN WO CONTRAST Result Date: 06/02/2024 CLINICAL DATA:  Initial evaluation for acute neuro deficit, stroke suspected. EXAM: MRI HEAD WITHOUT CONTRAST TECHNIQUE: Multiplanar, multiecho pulse sequences of the brain and surrounding structures were obtained without intravenous contrast. COMPARISON:  CT from earlier the same day. FINDINGS: Brain: Diffuse prominence of the CSF containing spaces compatible generalized cerebral atrophy. Patchy and confluent T2/FLAIR hyperintensity involving the periventricular deep white matter both cerebral hemispheres, consistent with chronic small vessel ischemic disease, moderately advanced in nature. Few small remote cortical to subcortical infarction involving the right frontal, parietal, and occipital lobes. Multiple  small remote bilateral cerebellar infarcts noted. Small remote lacunar infarcts noted about the left basal ganglia and left thalamus. Single 4 mm focus of restricted diffusion seen involving the high posterior right parietal cortex, consistent with a small acute ischemic nonhemorrhagic infarct (series 5, image 46). No other evidence for acute or subacute ischemia. Gray-white matter differentiation otherwise maintained. No acute or chronic intracranial blood products. No mass lesion, midline shift or mass effect. No hydrocephalus or  extra-axial fluid collection. Pituitary gland within normal limits. Vascular: Major intracranial vascular flow voids are maintained. Skull and upper cervical spine: Craniocervical junction within normal limits. Bone marrow signal intensity normal. No scalp soft tissue abnormality. Sinuses/Orbits: Globes orbital soft tissues within normal limits. Mild mucosal thickening present about the right sphenoid sinus. Paranasal sinuses are otherwise largely clear. Trace left mastoid effusion noted, of doubtful significance. Other: None. IMPRESSION: 1. 4 mm acute ischemic nonhemorrhagic cortical infarct involving the high posterior right parietal lobe. 2. Underlying age-related cerebral atrophy with moderately advanced chronic microvascular ischemic disease, with multiple remote ischemic infarcts as above. Electronically Signed   By: Virgia Griffins M.D.   On: 06/02/2024 23:56   CT HEAD WO CONTRAST ( ) Result Date: 06/02/2024 CLINICAL DATA:  GERD weakness EXAM: CT HEAD WITHOUT CONTRAST TECHNIQUE: Contiguous axial images were obtained from the base of the skull through the vertex without intravenous contrast. RADIATION DOSE REDUCTION: This exam was performed according to the departmental dose-optimization program which includes automated exposure control, adjustment of the mA and/or kV according to patient size and/or use of iterative reconstruction technique. COMPARISON:  CT 08/14/2022 FINDINGS: Brain: No acute territorial infarction, hemorrhage or intracranial mass. Atrophy and chronic small vessel ischemic changes of the white matter. Chronic right cerebellar and occipital infarcts. Age indeterminate small cortical/subcortical infarct at the right vertex, series 2, image 24 and series 4, image 45. The ventricles are nonenlarged Vascular: No hyperdense vessels.  No unexpected calcification Skull: Normal. Negative for fracture or focal lesion. Sinuses/Orbits: No acute finding. Other: None IMPRESSION: 1. Negative for  hemorrhage or mass. Age indeterminate small cortical/subcortical infarct at the right cranial vertex. 2. Atrophy and chronic small vessel ischemic changes of the white matter. Chronic right cerebellar and occipital infarcts. Electronically Signed   By: Esmeralda Hedge M.D.   On: 06/02/2024 20:15   DG Chest Portable 1 View Result Date: 06/02/2024 CLINICAL DATA:  Weakness EXAM: PORTABLE CHEST 1 VIEW COMPARISON:  08/14/2022 FINDINGS: The heart size and mediastinal contours are within normal limits. Aortic atherosclerosis. Both lungs are clear. Chronic sclerotic focus in the shaft of right humerus, consistent with benign finding IMPRESSION: No active disease. Electronically Signed   By: Esmeralda Hedge M.D.   On: 06/02/2024 20:10    Microbiology: No results found for this or any previous visit.  Labs: CBC: Recent Labs  Lab 06/02/24 1905  WBC 12.4*  NEUTROABS 7.4  HGB 14.9  HCT 48.8*  MCV 86.4  PLT 114*   Basic Metabolic Panel: Recent Labs  Lab 06/02/24 2033  NA 144  K 3.5  CL 110  CO2 23  GLUCOSE 115*  BUN 61*  CREATININE 1.05*  CALCIUM 7.4*  MG 2.3   Liver Function Tests: Recent Labs  Lab 06/02/24 2033  AST 18  ALT 7  ALKPHOS 34*  BILITOT 1.0  PROT 5.6*  ALBUMIN 2.5*   CBG: No results for input(s): GLUCAP in the last 168 hours.  Discharge time spent: greater than 30 minutes.  This record has been created using [employer]  recognition software. Errors have been sought and corrected,but may not always be located. Such creation errors do not reflect on the standard of care.   Signed: Luna Salinas, MD [facility] 06/03/2024

## 2024-06-03 NOTE — Evaluation (Signed)
 Clinical/Bedside Swallow Evaluation Patient Details  Name: Molly Parks MRN: [id number] Date of Birth: 1928/05/24  Today's Date: 06/03/2024 Time: SLP Start Time (ACUTE ONLY): 0840 SLP Stop Time (ACUTE ONLY): 0900 SLP Time Calculation (min) (ACUTE ONLY): 20 min  Past Medical History:  Past Medical History:  Diagnosis Date   COPD (chronic obstructive pulmonary disease) (HCC)    Dementia (HCC)    Dislocation, shoulder closed, left, initial encounter    Hypertension    Past Surgical History:  Past Surgical History:  Procedure Laterality Date   OOPHORECTOMY Right    HPI:  Per H&P,  Molly Parks is a 89 y.o. female with medical history significant of dementia, hypertension, COPD, anxiety, UTI, who presents with weakness, altered mental status.     Per her son (I called her son by phone), at her normal baseline, patient is able to ambulate with a cane, change her clothes, go to the bathroom, recognizes family's, knows where she she is living, but is usually not orientated to the time.  In the past 4 days, patient developed generalized weakness, with very poor appetite, stopped eating food.  She is more confused than her baseline.  When saw patient in ED, patient is very lethargic, pt knows her own name, not orientated to the place and time.  She moves all extremities.  No facial droop or slurred speech.  No active nausea, vomiting, diarrhea noted.  No active respiratory distress, cough, SOB noted.  Does not seem to have chest pain or abdominal pain.  Now admitted with acute CVA and UTI. MRI Brain : 4 mm acute ischemic nonhemorrhagic cortical infarct involving the  high posterior right parietal lobe.  2. Underlying age-related cerebral atrophy with moderately advanced  chronic microvascular ischemic disease, with multiple remote  ischemic infarcts as above. CXR: No active disease.    Assessment / Plan / Recommendation  Clinical Impression  Pt seen for bedside swallow assessment in the setting of  acute CVA (with UTI and baseline dementia). Pt alert, with intermittent command following with visual/tactile cues. Pt seen with trials of thin liquids (via straw), puree, and regular solids. No overt or subtle s/sx pharyngeal dysphagia noted. No change to vocal quality across trials. Vitals stable for duration of trials (99-100 O2 saturations for duration of session). Oral phase significantly impacted by acute (CVA) on chronic (dementia) factors, leading to prolonged manipulation of solids and reduced oral acceptance for advanced solids. Pt noted to expectorate puree trials prolonged after intake- ? reflux vs residue from back of oral cavity. Family not present to verify baseline.   Based on age, cognition, acute CVA, and current debility, pt is at moderate risk of aspiration, therefore recommend STRICT aspiration precautions (slow rate, small bites, elevated HOB, and alert for PO intake) and supervision for intake. Recommend puree and thin liquids diet. SLP will monitor for continued safety with current diet. MD and RN aware of recommendations.   Of note speech language eval held secondary to lack of family for verification of baseline and suspected increased confusion based on unfamiliar setting (in the ED)- plan to monitor for readiness for assessment.    SLP Visit Diagnosis: Dysphagia, oropharyngeal phase (R13.12)    Aspiration Risk  Moderate aspiration risk    Diet Recommendation   Dysphagia 1 (puree);Thin  Medication Administration: Crushed with puree    Other  Recommendations Oral Care Recommendations: Oral care before and after PO;Staff/trained caregiver to provide oral care     Assistance Recommended at Discharge  Functional Status Assessment Patient has had a recent decline in their functional status and/or demonstrates limited ability to make significant improvements in function in a reasonable and predictable amount of time (based on co-morbities)  Frequency and Duration min  2x/week  2 weeks       Prognosis Prognosis for improved oropharyngeal function: Fair Barriers to Reach Goals: Cognitive deficits;Severity of deficits      Swallow Study   General Date of Onset: 06/03/24 HPI: Per H&P,  Molly Parks is a 89 y.o. female with medical history significant of dementia, hypertension, COPD, anxiety, UTI, who presents with weakness, altered mental status.     Per her son (I called her son by phone), at her normal baseline, patient is able to ambulate with a cane, change her clothes, go to the bathroom, recognizes family's, knows where she she is living, but is usually not orientated to the time.  In the past 4 days, patient developed generalized weakness, with very poor appetite, stopped eating food.  She is more confused than her baseline.  When saw patient in ED, patient is very lethargic, pt knows her own name, not orientated to the place and time.  She moves all extremities.  No facial droop or slurred speech.  No active nausea, vomiting, diarrhea noted.  No active respiratory distress, cough, SOB noted.  Does not seem to have chest pain or abdominal pain.  Now admitted with acute CVA and UTI. MRI Brain : 4 mm acute ischemic nonhemorrhagic cortical infarct involving the  high posterior right parietal lobe.  2. Underlying age-related cerebral atrophy with moderately advanced  chronic microvascular ischemic disease, with multiple remote  ischemic infarcts as above. CXR: No active disease. Type of Study: Bedside Swallow Evaluation Previous Swallow Assessment: none in chart Diet Prior to this Study: NPO Temperature Spikes Noted: No (WBC 12.4) Respiratory Status: Room air History of Recent Intubation: No Behavior/Cognition: Alert;Confused;Requires cueing Oral Cavity Assessment: Within Functional Limits Oral Care Completed by SLP: Yes Oral Cavity - Dentition: Dentures, top;Missing dentition (missing dentition on bottom) Vision:  (total assist to eat) Self-Feeding  Abilities: Total assist Patient Positioning: Partially reclined (winced in pain with upright positioning- related to sacral wounds) Baseline Vocal Quality: Low vocal intensity (minimal voicing) Volitional Cough: Cognitively unable to elicit Volitional Swallow: Unable to elicit    Oral/Motor/Sensory Function Overall Oral Motor/Sensory Function:  (overtly intact- no official assessment secondary to cognition)   Ice Chips Ice chips: Within functional limits Presentation: Spoon   Thin Liquid Thin Liquid: Within functional limits Presentation: Straw    Nectar Thick Nectar Thick Liquid: Not tested   Honey Thick Honey Thick Liquid: Not tested   Puree Puree: Impaired Presentation: Spoon Oral Phase Impairments: Poor awareness of bolus Oral Phase Functional Implications: Oral residue;Prolonged oral transit Pharyngeal Phase Impairments:  (none) Other Comments: expectoration of eiter reflux or oral residue   Solid     Solid: Impaired Presentation:  (therapist assisted to mouth) Oral Phase Impairments: Poor awareness of bolus (pt not accepting trial into oral cavity) Oral Phase Functional Implications:  (no manipulation noted) Pharyngeal Phase Impairments:  (swallow not completed)     [place] Brandey Vandalen Clapp, MS, CCC-SLP Speech Language Pathologist Rehab Services; [facility] [phone] (ascom)   [place] J Clapp 06/03/2024,11:34 AM

## 2024-06-03 NOTE — Consult Note (Signed)
 WOC Nurse Consult Note: Reason for Consult: sacral/buttocks wounds  Wound type: Stage 2 Pressure Injury buttock, unstageable PI buttock  Pressure Injury POA: Yes Measurement: see nursing flowsheet  Wound AOZ:HYQMVHQ with small pink moist stage 2 and a very small unstageable with dark tissue  Drainage (amount, consistency, odor) see nursing flowsheet  Periwound intact  Dressing procedure/placement/frequency: Cleanse sacrum/buttocks with soap and water, dry and apply Xeroform gauze ([place] [phone]) to open areas daily and secure with silicone foam or ABD pad and tape whichever is preferred.   POC discussed with bedside nurse. WOC team will not follow. Re-consult if further needs arise.   Thank you    Ronni Colace MSN, RN-BC, [employer] [phone]

## 2024-06-03 NOTE — Progress Notes (Signed)
*  PRELIMINARY RESULTS* Echocardiogram 2D Echocardiogram has been performed.  Molly Parks 06/03/2024, 2:37 PM

## 2024-06-03 NOTE — Evaluation (Signed)
 Occupational Therapy Evaluation Patient Details Name: Molly Parks MRN: [id number] DOB: 22-Sep-1928 Today's Date: 06/03/2024   History of Present Illness   Pt is 89 y/o admitted to hospital with new 4 mm acute ischemic nonhemorrhagic cortical infarct involving the  high posterior right parietal lobe.     Clinical Impressions Patient presenting with decreased Ind in self care,balance, functional mobility/transfers, endurance, and cognition. Pt oriented to self and unable to answer questions about home set up. Per chart review, pt normally ambulates with SPC, changes clothing, and performs bathroom needs.Pt needing heavy max A of 2 for bed mobility and to sit EOB. She became very resistive once up and unable to continue or advance further this session.  Patient will benefit from acute OT to increase overall independence in the areas of ADLs, functional mobility, and safety awareness in order to safely discharge.     If plan is discharge home, recommend the following:   Two people to help with walking and/or transfers;Two people to help with bathing/dressing/bathroom;Assistance with cooking/housework;Assist for transportation;Help with stairs or ramp for entrance;Direct supervision/assist for financial management;Direct supervision/assist for medications management     Functional Status Assessment   Patient has had a recent decline in their functional status and demonstrates the ability to make significant improvements in function in a reasonable and predictable amount of time.     Equipment Recommendations   Other (comment) (defer to next venue of care)      Precautions/Restrictions   Precautions Precautions: Fall     Mobility Bed Mobility Overal bed mobility: Needs Assistance Bed Mobility: Supine to Sit, Sit to Supine     Supine to sit: Max assist, +2 for physical assistance Sit to supine: Max assist, +2 for physical assistance        Transfers                    General transfer comment: Pt refusal and resistive      Balance Overall balance assessment: Needs assistance Sitting-balance support: Feet supported Sitting balance-Leahy Scale: Poor Sitting balance - Comments: max A with pt pushing back towards therapist                                   ADL either performed or assessed with clinical judgement   ADL Overall ADL's : Needs assistance/impaired                                       General ADL Comments: self care tasks at bed level this session as pt declined OOB tasks and very resistive once seated on EOB. Pt unable to follow simple commands.     Vision Patient Visual Report: No change from baseline              Pertinent Vitals/Pain Pain Assessment Pain Assessment: Faces Faces Pain Scale: No hurt     Extremity/Trunk Assessment Upper Extremity Assessment Upper Extremity Assessment: Generalized weakness;Difficult to assess due to impaired cognition   Lower Extremity Assessment Lower Extremity Assessment: Generalized weakness;Difficult to assess due to impaired cognition       Communication Communication Communication: Impaired Factors Affecting Communication: Hearing impaired   Cognition Arousal: Alert Behavior During Therapy: Flat affect Cognition: History of cognitive impairments, No family/caregiver present to determine baseline  OT - Cognition Comments: Family appears oriented to self only.                 Following commands: Impaired Following commands impaired: Follows one step commands inconsistently     Cueing  General Comments   Cueing Techniques: Verbal cues;Gestural cues;Tactile cues;Visual cues              Home Living Family/patient expects to be discharged to:: Unsure                                 Additional Comments: Per chart review, pt lives with family and has 24/7 caregivers. At baseline is able to ambulate  with cane , change clothes, and go to bathroom without assistance. No family present to confirm baseline.      Prior Functioning/Environment                 ADLs Comments: Pt unable to verbalizes secondary to cognition. Per chart, pt has 24/7 caregiver as needed but is able to do some self care tasks herself with supervision.    OT Problem List: Decreased strength;Decreased safety awareness;Decreased activity tolerance;Impaired balance (sitting and/or standing)   OT Treatment/Interventions: Self-care/ADL training;Therapeutic activities;Therapeutic exercise;Energy conservation;Patient/family education;Balance training;DME and/or AE instruction      OT Goals(Current goals can be found in the care plan section)   Acute Rehab OT Goals Patient Stated Goal: to go home OT Goal Formulation: With patient Time For Goal Achievement: 06/03/24 Potential to Achieve Goals: Fair   OT Frequency:  Min 2X/week       AM-PAC OT 6 Clicks Daily Activity     Outcome Measure Help from another person eating meals?: A Little Help from another person taking care of personal grooming?: A Little Help from another person toileting, which includes using toliet, bedpan, or urinal?: Total Help from another person bathing (including washing, rinsing, drying)?: Total Help from another person to put on and taking off regular upper body clothing?: A Lot Help from another person to put on and taking off regular lower body clothing?: Total 6 Click Score: 11   End of Session    Activity Tolerance: Other (comment) (limited by cognition) Patient left:    OT Visit Diagnosis: Unsteadiness on feet (R26.81);Repeated falls (R29.6);Muscle weakness (generalized) (M62.81)                Time: 8657-8469 OT Time Calculation (min): 11 min Charges:  OT General Charges $OT Visit: 1 Visit OT Evaluation $OT Eval Moderate Complexity: 1 [address], [place], OTR/L , [facility]IS ascom [phone]  06/03/24, 1:57 PM

## 2024-06-04 LAB — HEMOGLOBIN A1C
Hgb A1c MFr Bld: 5.8 % — ABNORMAL HIGH (ref 4.8–5.6)
Mean Plasma Glucose: 120 mg/dL

## 2024-06-05 LAB — URINE CULTURE: Culture: >=100000 — AB

## 2024-07-17 ENCOUNTER — Emergency Department

## 2024-07-17 ENCOUNTER — Inpatient Hospital Stay
Admission: EM | Admit: 2024-07-17 | Discharge: 2024-08-03 | DRG: 871 | Disposition: A | Discharge status: Hospice | Attending: Osteopathic Medicine | Admitting: Osteopathic Medicine

## 2024-07-17 ENCOUNTER — Other Ambulatory Visit: Payer: Self-pay

## 2024-07-17 DIAGNOSIS — E876 Hypokalemia: Secondary | ICD-10-CM | POA: Diagnosis not present

## 2024-07-17 DIAGNOSIS — G9341 Metabolic encephalopathy: Secondary | ICD-10-CM | POA: Diagnosis present

## 2024-07-17 DIAGNOSIS — R6520 Severe sepsis without septic shock: Secondary | ICD-10-CM | POA: Diagnosis not present

## 2024-07-17 DIAGNOSIS — E43 Unspecified severe protein-calorie malnutrition: Secondary | ICD-10-CM | POA: Diagnosis present

## 2024-07-17 DIAGNOSIS — I472 Ventricular tachycardia, unspecified: Secondary | ICD-10-CM | POA: Diagnosis not present

## 2024-07-17 DIAGNOSIS — R8271 Bacteriuria: Secondary | ICD-10-CM | POA: Diagnosis not present

## 2024-07-17 DIAGNOSIS — G928 Other toxic encephalopathy: Secondary | ICD-10-CM | POA: Diagnosis not present

## 2024-07-17 DIAGNOSIS — N179 Acute kidney failure, unspecified: Secondary | ICD-10-CM | POA: Diagnosis present

## 2024-07-17 DIAGNOSIS — R627 Adult failure to thrive: Secondary | ICD-10-CM | POA: Diagnosis present

## 2024-07-17 DIAGNOSIS — N1831 Chronic kidney disease, stage 3a: Secondary | ICD-10-CM | POA: Diagnosis present

## 2024-07-17 DIAGNOSIS — Z789 Other specified health status: Secondary | ICD-10-CM | POA: Diagnosis not present

## 2024-07-17 DIAGNOSIS — N390 Urinary tract infection, site not specified: Secondary | ICD-10-CM | POA: Diagnosis present

## 2024-07-17 DIAGNOSIS — E66812 Obesity, class 2: Secondary | ICD-10-CM | POA: Diagnosis not present

## 2024-07-17 DIAGNOSIS — J449 Chronic obstructive pulmonary disease, unspecified: Secondary | ICD-10-CM | POA: Diagnosis present

## 2024-07-17 DIAGNOSIS — Z681 Body mass index (BMI) 19 or less, adult: Secondary | ICD-10-CM | POA: Diagnosis not present

## 2024-07-17 DIAGNOSIS — Z66 Do not resuscitate: Secondary | ICD-10-CM | POA: Diagnosis present

## 2024-07-17 DIAGNOSIS — E870 Hyperosmolality and hypernatremia: Secondary | ICD-10-CM | POA: Diagnosis present

## 2024-07-17 DIAGNOSIS — I129 Hypertensive chronic kidney disease with stage 1 through stage 4 chronic kidney disease, or unspecified chronic kidney disease: Secondary | ICD-10-CM | POA: Diagnosis present

## 2024-07-17 DIAGNOSIS — F419 Anxiety disorder, unspecified: Secondary | ICD-10-CM | POA: Diagnosis present

## 2024-07-17 DIAGNOSIS — E785 Hyperlipidemia, unspecified: Secondary | ICD-10-CM | POA: Diagnosis present

## 2024-07-17 DIAGNOSIS — A419 Sepsis, unspecified organism: Secondary | ICD-10-CM | POA: Diagnosis present

## 2024-07-17 DIAGNOSIS — R7989 Other specified abnormal findings of blood chemistry: Secondary | ICD-10-CM | POA: Diagnosis present

## 2024-07-17 DIAGNOSIS — Z7401 Bed confinement status: Secondary | ICD-10-CM

## 2024-07-17 DIAGNOSIS — R64 Cachexia: Secondary | ICD-10-CM | POA: Diagnosis present

## 2024-07-17 DIAGNOSIS — I959 Hypotension, unspecified: Principal | ICD-10-CM

## 2024-07-17 DIAGNOSIS — Z881 Allergy status to other antibiotic agents status: Secondary | ICD-10-CM

## 2024-07-17 DIAGNOSIS — Z7982 Long term (current) use of aspirin: Secondary | ICD-10-CM

## 2024-07-17 DIAGNOSIS — E872 Acidosis, unspecified: Secondary | ICD-10-CM | POA: Diagnosis present

## 2024-07-17 DIAGNOSIS — Z515 Encounter for palliative care: Secondary | ICD-10-CM | POA: Diagnosis not present

## 2024-07-17 DIAGNOSIS — Z87440 Personal history of urinary (tract) infections: Secondary | ICD-10-CM

## 2024-07-17 DIAGNOSIS — E8729 Other acidosis: Secondary | ICD-10-CM | POA: Diagnosis not present

## 2024-07-17 DIAGNOSIS — L89152 Pressure ulcer of sacral region, stage 2: Secondary | ICD-10-CM | POA: Diagnosis present

## 2024-07-17 DIAGNOSIS — R1310 Dysphagia, unspecified: Secondary | ICD-10-CM | POA: Diagnosis present

## 2024-07-17 DIAGNOSIS — R6521 Severe sepsis with septic shock: Secondary | ICD-10-CM | POA: Diagnosis present

## 2024-07-17 DIAGNOSIS — Z79899 Other long term (current) drug therapy: Secondary | ICD-10-CM

## 2024-07-17 DIAGNOSIS — Z8673 Personal history of transient ischemic attack (TIA), and cerebral infarction without residual deficits: Secondary | ICD-10-CM

## 2024-07-17 DIAGNOSIS — F0390 Unspecified dementia without behavioral disturbance: Secondary | ICD-10-CM | POA: Diagnosis present

## 2024-07-17 DIAGNOSIS — Z7189 Other specified counseling: Secondary | ICD-10-CM | POA: Diagnosis not present

## 2024-07-17 DIAGNOSIS — Z6835 Body mass index (BMI) 35.0-35.9, adult: Secondary | ICD-10-CM

## 2024-07-17 DIAGNOSIS — B965 Pseudomonas (aeruginosa) (mallei) (pseudomallei) as the cause of diseases classified elsewhere: Secondary | ICD-10-CM | POA: Diagnosis not present

## 2024-07-17 DIAGNOSIS — E162 Hypoglycemia, unspecified: Secondary | ICD-10-CM | POA: Diagnosis not present

## 2024-07-17 DIAGNOSIS — R4182 Altered mental status, unspecified: Secondary | ICD-10-CM | POA: Diagnosis present

## 2024-07-17 LAB — BASIC METABOLIC PANEL WITH GFR
Anion gap: 16 — ABNORMAL HIGH (ref 5–15)
BUN: 44 mg/dL — ABNORMAL HIGH (ref 8–23)
CO2: 16 mmol/L — ABNORMAL LOW (ref 22–32)
Calcium: 8.4 mg/dL — ABNORMAL LOW (ref 8.9–10.3)
Chloride: 118 mmol/L — ABNORMAL HIGH (ref 98–111)
Creatinine, Ser: 1.42 mg/dL — ABNORMAL HIGH (ref 0.44–1.00)
GFR, Estimated: 34 mL/min — ABNORMAL LOW (ref >60)
Glucose, Bld: 158 mg/dL — ABNORMAL HIGH (ref 70–99)
Potassium: 4.2 mmol/L (ref 3.5–5.1)
Sodium: 150 mmol/L — ABNORMAL HIGH (ref 135–145)

## 2024-07-17 LAB — CBC WITH DIFFERENTIAL/PLATELET
Abs Immature Granulocytes: 0.22 K/uL — ABNORMAL HIGH (ref 0.00–0.07)
Basophils Absolute: 0 K/uL (ref 0.0–0.1)
Basophils Relative: 0 %
Eosinophils Absolute: 0 K/uL (ref 0.0–0.5)
Eosinophils Relative: 0 %
HCT: 42 % (ref 36.0–46.0)
Hemoglobin: 12.7 g/dL (ref 12.0–15.0)
Immature Granulocytes: 2 %
Lymphocytes Relative: 13 %
Lymphs Abs: 1.7 K/uL (ref 0.7–4.0)
MCH: 27.1 pg (ref 26.0–34.0)
MCHC: 30.2 g/dL (ref 30.0–36.0)
MCV: 89.7 fL (ref 80.0–100.0)
Monocytes Absolute: 0.9 K/uL (ref 0.1–1.0)
Monocytes Relative: 7 %
Neutro Abs: 10.3 K/uL — ABNORMAL HIGH (ref 1.7–7.7)
Neutrophils Relative %: 78 %
Platelets: 124 K/uL — ABNORMAL LOW (ref 150–400)
RBC: 4.68 MIL/uL (ref 3.87–5.11)
RDW: 21.4 % — ABNORMAL HIGH (ref 11.5–15.5)
Smear Review: NORMAL
WBC: 13.2 K/uL — ABNORMAL HIGH (ref 4.0–10.5)
nRBC: 1.2 % — ABNORMAL HIGH (ref 0.0–0.2)

## 2024-07-17 LAB — HEPATIC FUNCTION PANEL
ALT: 7 U/L (ref 0–44)
AST: 22 U/L (ref 15–41)
Albumin: 2.6 g/dL — ABNORMAL LOW (ref 3.5–5.0)
Alkaline Phosphatase: 35 U/L — ABNORMAL LOW (ref 38–126)
Bilirubin, Direct: 0.3 mg/dL — ABNORMAL HIGH (ref 0.0–0.2)
Indirect Bilirubin: 0.8 mg/dL (ref 0.3–0.9)
Total Bilirubin: 1.1 mg/dL (ref 0.0–1.2)
Total Protein: 5.3 g/dL — ABNORMAL LOW (ref 6.5–8.1)

## 2024-07-17 LAB — LACTIC ACID, PLASMA: Lactic Acid, Venous: 3.8 mmol/L (ref 0.5–1.9)

## 2024-07-17 LAB — TROPONIN I (HIGH SENSITIVITY): Troponin I (High Sensitivity): 142 ng/L (ref <18)

## 2024-07-17 MED ORDER — CHLORHEXIDINE GLUCONATE CLOTH 2 % EX PADS
6.0000 | MEDICATED_PAD | Freq: Every day | CUTANEOUS | Status: DC
  Administered 2024-07-18 – 2024-07-20 (×3): 6 via TOPICAL
  Filled 2024-07-17: qty 6

## 2024-07-17 MED ORDER — NOREPINEPHRINE 4 MG/250ML-% IV SOLN
0.0000 ug/min | INTRAVENOUS | Status: DC
  Administered 2024-07-17: 10 ug/min via INTRAVENOUS
  Administered 2024-07-18: 2 ug/min via INTRAVENOUS
  Administered 2024-07-18: 15 ug/min via INTRAVENOUS
  Filled 2024-07-17 (×5): qty 250

## 2024-07-17 MED ORDER — HEPARIN SODIUM (PORCINE) 5000 UNIT/ML IJ SOLN
5000.0000 [IU] | Freq: Three times a day (TID) | INTRAMUSCULAR | Status: DC
  Administered 2024-07-18 – 2024-07-27 (×26): 5000 [IU] via SUBCUTANEOUS
  Filled 2024-07-17 (×32): qty 1

## 2024-07-17 MED ORDER — METRONIDAZOLE 500 MG/100ML IV SOLN
500.0000 mg | Freq: Once | INTRAVENOUS | Status: AC
  Administered 2024-07-17: 500 mg via INTRAVENOUS
  Filled 2024-07-17: qty 100

## 2024-07-17 MED ORDER — DOCUSATE SODIUM 100 MG PO CAPS: 100.0000 mg | ORAL_CAPSULE | Freq: Two times a day (BID) | ORAL | Status: DC | PRN

## 2024-07-17 MED ORDER — POLYETHYLENE GLYCOL 3350 17 G PO PACK
17.0000 g | PACK | Freq: Every day | ORAL | Status: DC | PRN
  Administered 2024-07-31: 17 g via ORAL
  Filled 2024-07-17: qty 1

## 2024-07-17 MED ORDER — SODIUM BICARBONATE 8.4 % IV SOLN
50.0000 meq | Freq: Once | INTRAVENOUS | Status: AC
  Administered 2024-07-17: 50 meq via INTRAVENOUS
  Filled 2024-07-17: qty 50

## 2024-07-17 MED ORDER — LACTATED RINGERS IV BOLUS (SEPSIS)
1000.0000 mL | Freq: Once | INTRAVENOUS | Status: AC
  Administered 2024-07-17: 1000 mL via INTRAVENOUS

## 2024-07-17 MED ORDER — SODIUM CHLORIDE 0.9 % IV SOLN
2.0000 g | Freq: Once | INTRAVENOUS | Status: AC
  Administered 2024-07-17: 2 g via INTRAVENOUS
  Filled 2024-07-17: qty 12.5

## 2024-07-17 MED ORDER — VANCOMYCIN HCL IN DEXTROSE 1-5 GM/200ML-% IV SOLN
1000.0000 mg | Freq: Once | INTRAVENOUS | Status: AC
  Administered 2024-07-18: 1000 mg via INTRAVENOUS
  Filled 2024-07-17: qty 200

## 2024-07-17 NOTE — ED Notes (Signed)
 Lab called to obtain blood cultures.

## 2024-07-17 NOTE — Progress Notes (Signed)
 Pt being followed by [facility] for Sepsis protocol.

## 2024-07-17 NOTE — ED Notes (Signed)
 CCMD called to enter patient information into monitor.

## 2024-07-17 NOTE — ED Triage Notes (Signed)
 Patient to ED via [facility] from home. Patient's family called out due to patient becoming more altered. Patient is currently on home hospice. Patient has Hx of recent stroke. Patient was initially unresponsive with EMS. Patient's BP was originally 85/58 so EMS started patient on a Levophed  drip at 4 mcg and titrated up to 10 mcg. Patient more alert at this time but not verbal. Patient's original HR with EMS was 113 but came down to 80. Patient received 1L of normal saline in route. EMS Vitals: 138/78 84 HR  CO2 17 RR 22-30

## 2024-07-17 NOTE — Progress Notes (Signed)
 CODE SEPSIS - PHARMACY COMMUNICATION  **Broad Spectrum Antibiotics should be administered within 1 hour of Sepsis diagnosis**  Time Code Sepsis Called/Page Received: 2148  Antibiotics Ordered: Cefepime , Flagyl , Vancomycin   Time of 1st antibiotic administration: 2156  Rankin CANDIE Dills, PharmD, [facility] 07/17/2024 9:49 PM

## 2024-07-17 NOTE — H&P (Signed)
 NAME:  Molly Parks, MRN:  [id number], DOB:  10-Mar-1928, LOS: 0 ADMISSION DATE:  07/17/2024, CONSULTATION DATE: 01/18/2024 REFERRING MD: Levander Slate, CHIEF COMPLAINT: Altered mental status  HPI  89 y.o female with significant PMH of dementia, HTN, COPD, anxiety, sacral ulcer, FTT, CKD stage III, CVA and recurrent Klebsiella UTI who presented to the ED with chief complaints of altered mental status  Per ED and patient's son report, the patient has experienced poor oral intake over the past several days. The son noted that the patient has been pocketing food and has had difficulty swallowing. Today, the patient appeared more lethargic and less responsive than usual. At [place], the patient is able to walk with a cane and perform ADLs with minimal assistance. The son mentioned that the patient typically acts like this when she has a UTI. During the last hospital visit, she suffered a stroke and was diagnosed with UTI. Since discharge with home hospice care, her functional capability has continued to decline. The son was concerned  today that her worsening mental status and unresponsiveness may be due to another UTI, so he called EMS. Upon EMS arrival, the patient was initially unresponsive, with a blood pressure reading of 52/30, which improved to 85/58 after administering 1 liter of fluids. The patient was started on a Levophed  drip, initiated at 4 micrograms and titrated up to 10, and was then transported to the ED.   ED Course: Initial vital signs showed Blood pressure (!) 53/37, pulse 96, temperature 98.7 F (37.1 C), temperature source Rectal, resp. rate 20, SpO2 100%. Pertinent Labs/Diagnostics Findings: Na+/ K+: 150/4.2 Glucose:158 BUN/Cr.:44/1.42 WBC:13.2K/L Plts:124 Lactic acid: 3.8 troponin: 142 CXR> CTH> CTA Chest> CT Abd/pelvis>neg Medication administered in the ZI:Ejupzwu given 30 cc/kg of fluids and started on broad-spectrum antibiotics Vanco cefepime  and Flagyl  for sepsis with septic  shock. Patient remained hypotensive despite IVF boluses therefore was started on Levophed . PCCM consulted. Disposition:ICU  Past Medical History  Dementia, HTN, COPD, anxiety, sacral ulcer, FTT, CKD stage III, CVA and recurrent Klebsiella UTI   Significant Hospital Events   7/26: Admitted with sepsis with septic shock secondary to UTI  Consults:  None  Procedures:  None  Interim History / Subjective:      Micro Data:  7/26: Blood culture x2> 7/27: Urine Culture> 7/27: MRSA PCR>>   Antimicrobials:  Vancomycin  7/26 x1 Cefepime  7/26 x1 Metronidazole  7/26 x1 Ceftriaxone  7/27>  OBJECTIVE  Blood pressure (!) 53/37, pulse 96, temperature 98.7 F (37.1 C), temperature source Rectal, resp. rate 20, height 6' (1.829 m), weight 49 kg, SpO2 100%.       No intake or output data in the 24 hours ending 07/17/24 2309 Filed Weights   07/17/24 2200  Weight: 49 kg   Physical Examination  GENERAL:32 year-old cachetic ill patient lying in the bed  EYES: PEERLA. No scleral icterus. Extraocular muscles intact.  HEENT: Head atraumatic, normocephalic. Oropharynx and nasopharynx clear.  CARDIOVASCULAR:S1, S2 normal. No murmurs, rubs, or gallops.  Intermittently tachycardic PULMONARY: Lungs sounds coarse ABDOMINAL: Soft, NTND MUSCULOSKELETAL:  NEUROLOGICAL: General: No focal deficit present. She is alert and oriented to person only.  PSYCHIATRIC:UTA SKIN:Skin is warm and dry. No obvious rash, lesion, SACRAL ULCER present on admission. Capillary Refill:< 2sec   Labs/imaging that I havepersonally reviewed  (right click and Reselect all SmartList Selections daily)    CT CHEST ABDOMEN PELVIS WO CONTRAST Result Date: 07/17/2024 CLINICAL DATA:  Unresponsive, possible sepsis EXAM: CT CHEST, ABDOMEN AND PELVIS WITHOUT CONTRAST TECHNIQUE:  Multidetector CT imaging of the chest, abdomen and pelvis was performed following the standard protocol without IV contrast. RADIATION DOSE REDUCTION: This  exam was performed according to the departmental dose-optimization program which includes automated exposure control, adjustment of the mA and/or kV according to patient size and/or use of iterative reconstruction technique. COMPARISON:  08/14/2022 FINDINGS: CT CHEST FINDINGS Cardiovascular: Limited due to lack of IV contrast. Atherosclerotic calcifications of the thoracic aorta are again seen. No aneurysmal dilatation is seen. Coronary calcifications are noted. No cardiac enlargement is seen. Mediastinum/Nodes: Thoracic inlet is within normal limits. No hilar or mediastinal adenopathy is noted. The esophagus as visualized is within normal limits. Lungs/Pleura: Lungs are well aerated bilaterally. 6 mm nodule is noted in the posterior aspect of the right upper lobe. This occurs in an area of previous ground-glass opacity. Few scattered smaller less than 4 mm nodules are noted throughout both lungs. Bibasilar atelectasis is noted. Musculoskeletal: Degenerative changes of the thoracic spine are seen. No acute rib abnormality is noted. CT ABDOMEN PELVIS FINDINGS Hepatobiliary: No focal liver abnormality is seen. Status post cholecystectomy. No biliary dilatation. Pancreas: Unremarkable. No pancreatic ductal dilatation or surrounding inflammatory changes. Spleen: Normal in size without focal abnormality. Adrenals/Urinary Tract: Adrenal glands are within normal limits. Kidneys are well visualized bilaterally. No renal calculi are seen. No obstructive changes are noted. Ureters are within normal limits. Bladder is partially distended. Stomach/Bowel: No obstructive or inflammatory changes of the colon are seen. Scattered diverticular changes noted without evidence of diverticulitis. The appendix is within normal limits. Small bowel and stomach are unremarkable. Vascular/Lymphatic: Aortic atherosclerosis. No enlarged abdominal or pelvic lymph nodes. Reproductive: Uterus and bilateral adnexa are unremarkable. Other: No  abdominal wall hernia or abnormality. No abdominopelvic ascites. Musculoskeletal: Degenerative changes of lumbar spine are noted. IMPRESSION: CT of the chest: Multiple scattered pulmonary nodules the largest of which measures 6 mm in the right upper lobe. Follow-up can be performed as clinically indicated given the patient's advanced age. No other focal abnormality is noted. CT of the abdomen and pelvis: Diverticulosis without diverticulitis. No other focal abnormality is seen. Electronically Signed   By: Oneil Devonshire M.D.   On: 07/17/2024 23:04   CT Head Wo Contrast Result Date: 07/17/2024 CLINICAL DATA:  Altered mental status EXAM: CT HEAD WITHOUT CONTRAST TECHNIQUE: Contiguous axial images were obtained from the base of the skull through the vertex without intravenous contrast. RADIATION DOSE REDUCTION: This exam was performed according to the departmental dose-optimization program which includes automated exposure control, adjustment of the mA and/or kV according to patient size and/or use of iterative reconstruction technique. COMPARISON:  06/02/2024 FINDINGS: Brain: No evidence of acute infarction, hemorrhage, hydrocephalus, extra-axial collection or mass lesion/mass effect. Chronic atrophic changes and white matter ischemic changes are again seen. Findings of prior infarct in the right occipital lobe, right cerebellum and high near the vertex in the right parietal lobe. These are stable in appearance from the prior exam. Vascular: No hyperdense vessel or unexpected calcification. Skull: Normal. Negative for fracture or focal lesion. Sinuses/Orbits: No acute finding. Other: None. IMPRESSION: Chronic ischemic changes without acute abnormality. Electronically Signed   By: Oneil Devonshire M.D.   On: 07/17/2024 22:58   DG Chest Port 1 View Result Date: 07/17/2024 CLINICAL DATA:  Questionable sepsis - evaluate for abnormality EXAM: PORTABLE CHEST 1 VIEW COMPARISON:  06/02/2024 FINDINGS: Heart and mediastinal  contours are within normal limits. No focal opacities or effusions. No acute bony abnormality. Aortic atherosclerosis. IMPRESSION: No active disease.  Electronically Signed   By: Franky Crease M.D.   On: 07/17/2024 22:04    Labs   CBC: Recent Labs  Lab 07/17/24 2119  WBC 13.2*  NEUTROABS 10.3*  HGB 12.7  HCT 42.0  MCV 89.7  PLT 124*   Basic Metabolic Panel: Recent Labs  Lab 07/17/24 2119  NA 150*  K 4.2  CL 118*  CO2 16*  GLUCOSE 158*  BUN 44*  CREATININE 1.42*  CALCIUM  8.4*   GFR: Estimated Creatinine Clearance: 18.7 mL/min (A) (by C-G formula based on SCr of 1.42 mg/dL (H)). Recent Labs  Lab 07/17/24 2119  WBC 13.2*  LATICACIDVEN 3.8*   Liver Function Tests: Recent Labs  Lab 07/17/24 2119  AST 22  ALT 7  ALKPHOS 35*  BILITOT 1.1  PROT 5.3*  ALBUMIN 2.6*   No results for input(s): LIPASE, AMYLASE in the last 168 hours. No results for input(s): AMMONIA in the last 168 hours.  ABG No results found for: PHART, PCO2ART, PO2ART, HCO3, TCO2, ACIDBASEDEF, O2SAT   Coagulation Profile: No results for input(s): INR, PROTIME in the last 168 hours.  Cardiac Enzymes: No results for input(s): CKTOTAL, CKMB, CKMBINDEX, TROPONINI in the last 168 hours.  HbA1C: Hgb A1c MFr Bld  Date/Time Value Ref Range Status  06/03/2024 06:24 AM 5.8 (H) 4.8 - 5.6 % Final    Comment:    (NOTE)         Prediabetes: 5.7 - 6.4         Diabetes: >6.4         Glycemic control for adults with diabetes: <7.0     CBG: No results for input(s): GLUCAP in the last 168 hours.  Review of Systems:   Unable to be obtained secondary to the patient's altered mental status   Past Medical History  She,  has a past medical history of COPD (chronic obstructive pulmonary disease) (HCC), Dementia (HCC), Dislocation, shoulder closed, left, initial encounter, and Hypertension.   Surgical History    Past Surgical History:  Procedure Laterality Date    OOPHORECTOMY Right    Social History   reports that she has never smoked. She has never used smokeless tobacco. She reports that she does not drink alcohol and does not use drugs.   Family History   Her family history includes Pulmonary embolism in her sister.  Allergies Allergies  Allergen Reactions   Ciprofloxacin     Penicillins     Has patient had a PCN reaction causing immediate rash, facial/tongue/throat swelling, SOB or lightheadedness with hypotension: Unknown Has patient had a PCN reaction causing severe rash involving mucus membranes or skin necrosis: Unknown Has patient had a PCN reaction that required hospitalization: Unknown Has patient had a PCN reaction occurring within the last 10 years: Unknown If all of the above answers are NO, then may proceed with Cephalosporin use.   Home Medications  Prior to Admission medications   Medication Sig Start Date End Date Taking? Authorizing Provider  amLODipine  (NORVASC ) 5 MG tablet Take 1 tablet (5 mg total) by mouth daily. 01/02/19   Viviann Pastor, MD  aspirin  325 MG EC tablet Take 325 mg daily by mouth.    [provider]  atorvastatin  (LIPITOR) 20 MG tablet Take 1 tablet (20 mg total) by mouth daily. 06/04/24   Caleen Qualia, MD  bisacodyl  (DULCOLAX) 5 MG EC tablet Take 1 tablet (5 mg total) daily as needed by mouth for moderate constipation. 11/05/17   Laurence Bridegroom, MD  clonazePAM  (KLONOPIN )  0.5 MG tablet Take 0.5 tablets (0.25 mg total) by mouth 2 (two) times daily as needed. Patient taking differently: Take 0.25 mg by mouth at bedtime as needed for anxiety. 03/11/18   Barbette Cea, MD  feeding supplement (ENSURE PLUS HIGH PROTEIN) LIQD Take 237 mLs by mouth 2 (two) times daily between meals. 06/03/24   Amin, Sumayya, MD  metoprolol  tartrate (LOPRESSOR ) 25 MG tablet Take 0.5 tablets (12.5 mg total) 2 (two) times daily by mouth. 11/05/17   Laurence Bridegroom, MD  polyethylene glycol (MIRALAX  / GLYCOLAX ) packet Take 17 g by mouth  daily as needed for mild constipation. 03/11/18   Barbette Cea, MD  potassium chloride  (KLOR-CON ) 10 MEQ tablet Take 10 mEq by mouth daily.    [provider]  Scheduled Meds:  Chlorhexidine  Gluconate Cloth  6 each Topical Daily   heparin   5,000 Units Subcutaneous Q8H   hydrocortisone  sod succinate (SOLU-CORTEF ) inj  75 mg Intravenous Q12H   Continuous Infusions:  norepinephrine  (LEVOPHED ) Adult infusion 5 mcg/min (07/18/24 0242)   PRN Meds:.docusate sodium , polyethylene glycol  Active Hospital Problem list   See systems below  Assessment & Plan:  #Sepsis with septic shock due to UTI Initial interventions/workup included: 30cc/kg IVF & Cefepime / Vancomycin / Metronidazole  meets SIRS criteria: Shock Index (SI): -F/u cultures, trend lactic/ PCT -Monitor WBC/ fever curve -IV antibiotics: received Flagyl  & cefepime  & vancomycin . She has hx of klebsiella UTI will start on Ceftriaxone  -IVF hydration as needed -Start stress dose steroids -Pressors for MAP goal >65 -Strict I/O's  #Altered Mental Status likely worsened iso UTI Hx of DementiaCVAAnxiety -CT head neg -treat infection as above -Will do home clonazepam  -Start aspirin  once able to take p.o. -promote sleep-wake cycles  #AKI on CKD stage III: AKI likely iso above #AGMA with Lactic acidosis #Hypernatremia -trend Lactate -F/u BMP+mag -promote renal perfusion -Avoid nephrotoxic agents  -Replete/correct lytes -strict I&Os  #Elevated Troponin likely demand #HTN #HLD No hx of CAD Pt in wide complex tachy likely iso high dose Levophed  per staff improved with decreasing dose -trend troponin until peaked -Hold amlodipine  and metoprolol  iso hypotension -Restart atorvastatin  once able to take p.o.  #FTT #Sacral Ulcer She uses a walker at baseline , recent poor po intake per son -PT/OT -Dietary consult -Palliative consult  #COPD without evidence of Exacerbation  -Supplemental oxygen as needed, maintain SpO2 >  88% -PRN and scheduled Bronchodilators     Best practice:  Diet:  Oral Pain/Anxiety/Delirium protocol (if indicated): No VAP protocol (if indicated): Not indicated DVT prophylaxis: Subcutaneous Heparin  GI prophylaxis: PPI Glucose control:  SSI No Central venous access:  N/A Arterial line:  N/A Foley:  Yes, and it is still needed Mobility:  bed rest  PT consulted: Yes Last date of multidisciplinary goals of care discussion []  Code Status:  full code Disposition: ICU   = Goals of Care = Code Status Order: FULL   Primary Emergency Contact: Wallace,Glen, Home Phone: [phone] Wishes to pursue full aggressive treatment and intervention options, including CPR and intubation, but goals of care will be addressed on going with family if that should become necessary.   Critical care time: 45 minutes        Almarie Nose DNP, CCRN, FNP-C, AGACNP-BC Acute Care & Family Nurse Practitioner [facility] PCCM on call pager [phone]

## 2024-07-17 NOTE — ED Notes (Signed)
 Date and time results received: 07/17/24 2210  Test: troponin Critical Value: 142  Name of Provider Notified: MD Ray

## 2024-07-17 NOTE — ED Provider Notes (Signed)
 [facility] Provider Note    Event Date/Time   First MD Initiated Contact with Patient 07/17/24 2101     (approximate)   History   Code Sepsis   HPI  Molly Parks is a 89 year old female with history of dementia, hypertension, COPD, anxiety, UTI presenting with altered mental status, poor p.o. intake.  History provided by patient's son.  He notes that patient has not been wanting to eat over the past several days.  Today, she was not responsive to him, so EMS was called.  Initial blood pressure was 52/30 (correction from triage note here), received 1 L of fluids, had Levophed  drip initiated at 4 mcg, titrated up to 10 mcg prior to presentation.  Patient more awake here.  Able to tell me her name, unable to provide additional history.  Reviewed discharge summary from 6S 11/2024.  At that time, patient was admitted with altered mental status, MRI brain demonstrated acute stroke.  Also treated for UTI.  Discharged on home hospice.     Physical Exam   Triage Vital Signs: ED Triage Vitals  Encounter Vitals Group     BP 07/17/24 2111 114/70     Girls Systolic BP Percentile --      Girls Diastolic BP Percentile --      Boys Systolic BP Percentile --      Boys Diastolic BP Percentile --      Pulse Rate 07/17/24 2111 92     Resp 07/17/24 2111 19     Temp 07/17/24 2111 98.7 F (37.1 C)     Temp Source 07/17/24 2111 Rectal     SpO2 07/17/24 2111 100 %     Weight 07/17/24 2200 108 lb (49 kg)     Height 07/17/24 2200 6' (1.829 m)     Head Circumference --      Peak Flow --      Pain Score --      Pain Loc --      Pain Education --      Exclude from Growth Chart --     Most recent vital signs: Vitals:   07/17/24 2300 07/17/24 2315  BP: 112/72 123/87  Pulse: 88 98  Resp: 20 20  Temp:    SpO2: 99% 100%     General: Somnolent but arousable CV:  Variable heart rates, faint peripheral pulses Resp:  Unlabored respirations.  Abd:  Nondistended, no  appreciable tenderness Neuro:  No gross facial asymmetry, generalized weakness, minimal vocalization, no appreciable focal deficits   ED Results / Procedures / Treatments   Labs (all labs ordered are listed, but only abnormal results are displayed) Labs Reviewed  BASIC METABOLIC PANEL WITH GFR - Abnormal; Notable for the following components:      Result Value   Sodium 150 (*)    Chloride 118 (*)    CO2 16 (*)    Glucose, Bld 158 (*)    BUN 44 (*)    Creatinine, Ser 1.42 (*)    Calcium  8.4 (*)    GFR, Estimated 34 (*)    Anion gap 16 (*)    All other components within normal limits  CBC WITH DIFFERENTIAL/PLATELET - Abnormal; Notable for the following components:   WBC 13.2 (*)    RDW 21.4 (*)    Platelets 124 (*)    nRBC 1.2 (*)    Neutro Abs 10.3 (*)    Abs Immature Granulocytes 0.22 (*)    All other components  within normal limits  LACTIC ACID, PLASMA - Abnormal; Notable for the following components:   Lactic Acid, Venous 3.8 (*)    All other components within normal limits  HEPATIC FUNCTION PANEL - Abnormal; Notable for the following components:   Total Protein 5.3 (*)    Albumin 2.6 (*)    Alkaline Phosphatase 35 (*)    Bilirubin, Direct 0.3 (*)    All other components within normal limits  TROPONIN I (HIGH SENSITIVITY) - Abnormal; Notable for the following components:   Troponin I (High Sensitivity) 142 (*)    All other components within normal limits  CULTURE, BLOOD (ROUTINE X 2)  CULTURE, BLOOD (ROUTINE X 2)  MRSA NEXT GEN BY PCR, NASAL  URINALYSIS, W/ REFLEX TO CULTURE (INFECTION SUSPECTED)  PROCALCITONIN  APTT  PROTIME-INR  PHOSPHORUS  MAGNESIUM   BASIC METABOLIC PANEL WITH GFR  CBC  TROPONIN I (HIGH SENSITIVITY)     EKG EKG independently reviewed and interpreted by myself demonstrates:  EKG from 2114 demonstrated narrow complex tachycardia at a rate of 114 2, significant artifact, possibly related to SVT, P waves not clearly discernible, PR 132, QRS  116, QTc 539 Repeat EKG from 2117 demonstrated sinus tachycardia with frequent PVCs, occasional bigeminy at a rate of 126, PR 95, QRS 112, QTc 461 Repeat EKG at 2119 demonstrated narrow complex irregular tachycardia rate of 157, QRS 88, QTc 477, P waves do appear present on some complexes towards the end of the EKG, but not clearly present at the beginning   RADIOLOGY Imaging independently reviewed and interpreted by myself demonstrates:  CXR without focal consolidation  Formal Radiology Read:    PROCEDURES:  Critical Care performed: Yes, see critical care procedure note(s)  CRITICAL CARE Performed by: Nilsa Dade   Total critical care time: 75 minutes  Critical care time was exclusive of separately billable procedures and treating other patients.  Critical care was necessary to treat or prevent imminent or life-threatening deterioration.  Critical care was time spent personally by me on the following activities: development of treatment plan with patient and/or surrogate as well as nursing, discussions with consultants, evaluation of patient's response to treatment, examination of patient, obtaining history from patient or surrogate, ordering and performing treatments and interventions, ordering and review of laboratory studies, ordering and review of radiographic studies, pulse oximetry and re-evaluation of patient's condition.   Procedures   MEDICATIONS ORDERED IN ED: Medications  norepinephrine  (LEVOPHED ) 4mg  in (0.016 mg/mL) premix infusion (25 mcg/min Intravenous Rate/Dose Change 07/17/24 2219)  metroNIDAZOLE  (FLAGYL ) IVPB 500 mg (500 mg Intravenous New Bag/Given 07/17/24 2300)  vancomycin  (VANCOCIN ) IVPB 1000 mg/200 mL premix (has no administration in time range)  Chlorhexidine  Gluconate Cloth 2 % PADS 6 each (has no administration in time range)  docusate sodium  (COLACE) capsule 100 mg (has no administration in time range)  polyethylene glycol (MIRALAX  / GLYCOLAX )  packet 17 g (has no administration in time range)  heparin  injection 5,000 Units (has no administration in time range)  lactated ringers  bolus 1,000 mL (0 mLs Intravenous Stopped 07/17/24 2254)  ceFEPIme  (MAXIPIME ) 2 g in sodium chloride  0.9 % 100 mL IVPB (0 g Intravenous Stopped 07/17/24 2254)  sodium bicarbonate  injection 50 mEq (50 mEq Intravenous Given 07/17/24 2154)     IMPRESSION / MDM / ASSESSMENT AND PLAN / ED COURSE  I reviewed the triage vital signs and the nursing notes.  Differential diagnosis includes, but is not limited to, dehydration, anemia, infection including pneumonia, UTI, intra-abdominal process, intracranial bleed  Patient's presentation is most consistent with acute presentation with potential threat to life or bodily function.  89 year old female presenting with altered mental status found to be profoundly hypotensive with EMS, initiated on Levophed  prior to presentation.  Initially normotensive here, later noted to become hypertensive with significant tachycardia.  She was noted to have Levophed  that was still running not connected to a pump.  This was paused, but patient did have recurrent profound hypotension so titratable Levophed  on a pump was initiated uptitrated up to 40, but able to be weaned down from there.  Also received additional liter of IV fluids for greater than 30 cc/kg of IV fluids.  Ordered for broad-spectrum antibiotics and sepsis orders initiated.  Had extensive goals of care conversation with patient's son.  He notes that patient is on hospice, but he would like aggressive care including pressors, chest compressions, defibrillation, other aggressive measures.  As the patient was having clinical decompensation, I did once again discussed this with him and he maintained these goals of care.  During this time, labs resulting demonstrating leukocytosis, elevated troponin, elevated lactate.  Patient was given an amp of bicarb.  Case was discussed with NP Ouma  with ICU team.  She evaluated the patient and will admit for further management.      FINAL CLINICAL IMPRESSION(S) / ED DIAGNOSES   Final diagnoses:  Hypotension, unspecified hypotension type  Altered mental status, unspecified altered mental status type     Rx / DC Orders   ED Discharge Orders     None        Note:  This document was prepared using Dragon voice recognition software and may include unintentional dictation errors.   Levander Slate, MD 07/17/24 [phone]

## 2024-07-17 NOTE — ED Notes (Signed)
 Date and time results received: 07/17/24 2215  Test: Lactic Acid Critical Value: 3.8  Name of Provider Notified: MD Ray

## 2024-07-18 ENCOUNTER — Encounter: Payer: Self-pay | Admitting: Student in an Organized Health Care Education/Training Program

## 2024-07-18 ENCOUNTER — Other Ambulatory Visit: Payer: Self-pay

## 2024-07-18 DIAGNOSIS — G928 Other toxic encephalopathy: Secondary | ICD-10-CM

## 2024-07-18 DIAGNOSIS — R6521 Severe sepsis with septic shock: Secondary | ICD-10-CM

## 2024-07-18 DIAGNOSIS — N390 Urinary tract infection, site not specified: Secondary | ICD-10-CM | POA: Diagnosis not present

## 2024-07-18 DIAGNOSIS — A419 Sepsis, unspecified organism: Secondary | ICD-10-CM | POA: Diagnosis not present

## 2024-07-18 LAB — TROPONIN I (HIGH SENSITIVITY)
Troponin I (High Sensitivity): 541 ng/L (ref <18)
Troponin I (High Sensitivity): 735 ng/L (ref <18)
Troponin I (High Sensitivity): 598 ng/L (ref <18)
Troponin I (High Sensitivity): 539 ng/L (ref <18)

## 2024-07-18 LAB — CBC
HCT: 41.3 % (ref 36.0–46.0)
Hemoglobin: 13 g/dL (ref 12.0–15.0)
MCH: 27.2 pg (ref 26.0–34.0)
MCHC: 31.5 g/dL (ref 30.0–36.0)
MCV: 86.4 fL (ref 80.0–100.0)
Platelets: 114 K/uL — ABNORMAL LOW (ref 150–400)
RBC: 4.78 MIL/uL (ref 3.87–5.11)
RDW: 21.4 % — ABNORMAL HIGH (ref 11.5–15.5)
WBC: 15.2 K/uL — ABNORMAL HIGH (ref 4.0–10.5)
nRBC: 1.2 % — ABNORMAL HIGH (ref 0.0–0.2)

## 2024-07-18 LAB — URINALYSIS, W/ REFLEX TO CULTURE (INFECTION SUSPECTED)
Bacteria, UA: NONE SEEN
Bilirubin Urine: NEGATIVE
Bilirubin Urine: NEGATIVE
Glucose, UA: 50 mg/dL — AB
Glucose, UA: NEGATIVE mg/dL
Ketones, ur: 5 mg/dL — AB
Ketones, ur: 5 mg/dL — AB
Nitrite: NEGATIVE
Nitrite: NEGATIVE
Protein, ur: 30 mg/dL — AB
Protein, ur: 30 mg/dL — AB
Specific Gravity, Urine: 1.009 (ref 1.005–1.030)
Specific Gravity, Urine: 1.011 (ref 1.005–1.030)
Squamous Epithelial / HPF: 0 /HPF (ref 0–5)
pH: 6 (ref 5.0–8.0)
pH: 5 (ref 5.0–8.0)

## 2024-07-18 LAB — BASIC METABOLIC PANEL WITH GFR
Anion gap: 14 (ref 5–15)
BUN: 44 mg/dL — ABNORMAL HIGH (ref 8–23)
CO2: 18 mmol/L — ABNORMAL LOW (ref 22–32)
Calcium: 8.2 mg/dL — ABNORMAL LOW (ref 8.9–10.3)
Chloride: 115 mmol/L — ABNORMAL HIGH (ref 98–111)
Creatinine, Ser: 1.23 mg/dL — ABNORMAL HIGH (ref 0.44–1.00)
GFR, Estimated: 41 mL/min — ABNORMAL LOW (ref >60)
Glucose, Bld: 236 mg/dL — ABNORMAL HIGH (ref 70–99)
Potassium: 3.7 mmol/L (ref 3.5–5.1)
Sodium: 147 mmol/L — ABNORMAL HIGH (ref 135–145)

## 2024-07-18 LAB — LACTIC ACID, PLASMA
Lactic Acid, Venous: 3.3 mmol/L (ref 0.5–1.9)
Lactic Acid, Venous: 3 mmol/L (ref 0.5–1.9)

## 2024-07-18 LAB — MRSA NEXT GEN BY PCR, NASAL: MRSA by PCR Next Gen: NOT DETECTED

## 2024-07-18 LAB — PROCALCITONIN: Procalcitonin: 0.36 ng/mL

## 2024-07-18 LAB — PROTIME-INR
INR: 1.2 (ref 0.8–1.2)
Prothrombin Time: 16.2 s — ABNORMAL HIGH (ref 11.4–15.2)

## 2024-07-18 LAB — GLUCOSE, CAPILLARY: Glucose-Capillary: 181 mg/dL — ABNORMAL HIGH (ref 70–99)

## 2024-07-18 LAB — APTT: aPTT: 30 s (ref 24–36)

## 2024-07-18 LAB — PHOSPHORUS: Phosphorus: 2.5 mg/dL (ref 2.5–4.6)

## 2024-07-18 LAB — MAGNESIUM: Magnesium: 2 mg/dL (ref 1.7–2.4)

## 2024-07-18 MED ORDER — ORAL CARE MOUTH RINSE
15.0000 mL | OROMUCOSAL | Status: DC | PRN
Start: 2024-07-18 — End: 2024-08-03

## 2024-07-18 MED ORDER — PANTOPRAZOLE SODIUM 40 MG IV SOLR
40.0000 mg | INTRAVENOUS | Status: DC
  Administered 2024-07-18: 40 mg via INTRAVENOUS
  Filled 2024-07-18: qty 10

## 2024-07-18 MED ORDER — SODIUM CHLORIDE 0.9 % IV SOLN
1.0000 g | INTRAVENOUS | Status: DC
  Administered 2024-07-18 – 2024-07-26 (×9): 1 g via INTRAVENOUS
  Filled 2024-07-18 (×9): qty 10

## 2024-07-18 MED ORDER — HYDROCORTISONE SOD SUC (PF) 100 MG IJ SOLR
75.0000 mg | Freq: Two times a day (BID) | INTRAMUSCULAR | Status: DC
  Administered 2024-07-18: 75 mg via INTRAVENOUS
  Filled 2024-07-18 (×2): qty 1.5

## 2024-07-18 NOTE — ED Notes (Signed)
 Lab at bedside

## 2024-07-18 NOTE — ED Notes (Signed)
 Lab called to see if blood cultures were collected on previous shift. Per lab, no blood cultures collected on pt. Lab states they will send phlebotomy to collect.

## 2024-07-18 NOTE — ED Notes (Signed)
 Phlebotomy attempted to get blood cultures. Unsuccessful attempt.  MD made aware.

## 2024-07-18 NOTE — Progress Notes (Signed)
 Civil engineer, contracting Hospitalized Hospice Patient  [name] is a current hospice patient we follow at home for terminal diagnosis of CVD.  Family called EMS with complaint of increased AMS.  Patient was admitted to [facility] on 7.26.25 with Dx of sepsis with septic shock related to UTI.  Per Dr. Norleen Laurence, this is a related hospital admission.  Visit to bedside.  Patient is resting with her eyes closed and not very interactive.  Patient is on levophed  gtt for continued hypotension.  No family is at bedside.  Patient is NPO.  Patient is appropriate for GIP level of care requiring critical care monitoring and intervention to treat hospital problems listed below.   Vital Signs: 98.7 R, BP 53/37, P 96, R 20, Oxi 100%  Abnormal Labs: WBC 13.2, Neutroabs 10.3, PLT 124, Na 150, CL 118, C02 16, Glucose 158, BUN 44, Creat 1.42, Ca 8.4, latic acid ven 3.8, ALKPHOS 35, Prot 5.3, Albumin 2.6  IV/PRN Meds Maxipime  2g IV x1 on 7.26.25 Rocephin  1g IV every day Vancomycin  1g IV x1 Levophed  4mg /247ml  61mcg/min continuous gtt   I&O PO 6.9ml IV 597.23 ml [place] I/O 447.23  Diagnostics:  CT CHEST ABDOMEN PELVIS WO CONTRAST Result Date: 07/17/2024 CLINICAL DATA:  Unresponsive, possible sepsis EXAM: CT CHEST, ABDOMEN AND PELVIS WITHOUT CONTRAST TECHNIQUE: Multidetector CT imaging of the chest, abdomen and pelvis was performed following the standard protocol without IV contrast. RADIATION DOSE REDUCTION: This exam was performed according to the departmental dose-optimization program which includes automated exposure control, adjustment of the mA and/or kV according to patient size and/or use of iterative reconstruction technique. COMPARISON:  08/14/2022 FINDINGS: CT CHEST FINDINGS Cardiovascular: Limited due to lack of IV contrast. Atherosclerotic calcifications of the thoracic aorta are again seen. No aneurysmal dilatation is seen. Coronary calcifications are noted. No cardiac enlargement is seen.  Mediastinum/Nodes: Thoracic inlet is within normal limits. No hilar or mediastinal adenopathy is noted. The esophagus as visualized is within normal limits. Lungs/Pleura: Lungs are well aerated bilaterally. 6 mm nodule is noted in the posterior aspect of the right upper lobe. This occurs in an area of previous ground-glass opacity. Few scattered smaller less than 4 mm nodules are noted throughout both lungs. Bibasilar atelectasis is noted. Musculoskeletal: Degenerative changes of the thoracic spine are seen. No acute rib abnormality is noted. CT ABDOMEN PELVIS FINDINGS Hepatobiliary: No focal liver abnormality is seen. Status post cholecystectomy. No biliary dilatation. Pancreas: Unremarkable. No pancreatic ductal dilatation or surrounding inflammatory changes. Spleen: Normal in size without focal abnormality. Adrenals/Urinary Tract: Adrenal glands are within normal limits. Kidneys are well visualized bilaterally. No renal calculi are seen. No obstructive changes are noted. Ureters are within normal limits. Bladder is partially distended. Stomach/Bowel: No obstructive or inflammatory changes of the colon are seen. Scattered diverticular changes noted without evidence of diverticulitis. The appendix is within normal limits. Small bowel and stomach are unremarkable. Vascular/Lymphatic: Aortic atherosclerosis. No enlarged abdominal or pelvic lymph nodes. Reproductive: Uterus and bilateral adnexa are unremarkable. Other: No abdominal wall hernia or abnormality. No abdominopelvic ascites. Musculoskeletal: Degenerative changes of lumbar spine are noted. IMPRESSION: CT of the chest: Multiple scattered pulmonary nodules the largest of which measures 6 mm in the right upper lobe. Follow-up can be performed as clinically indicated given the patient's advanced age. No other focal abnormality is noted. CT of the abdomen and pelvis: Diverticulosis without diverticulitis. No other focal abnormality is seen. Electronically Signed    By: Oneil Devonshire M.D.   On:  07/17/2024 23:04    CT Head Wo Contrast Result Date: 07/17/2024 CLINICAL DATA:  Altered mental status EXAM: CT HEAD WITHOUT CONTRAST TECHNIQUE: Contiguous axial images were obtained from the base of the skull through the vertex without intravenous contrast. RADIATION DOSE REDUCTION: This exam was performed according to the departmental dose-optimization program which includes automated exposure control, adjustment of the mA and/or kV according to patient size and/or use of iterative reconstruction technique. COMPARISON:  06/02/2024 FINDINGS: Brain: No evidence of acute infarction, hemorrhage, hydrocephalus, extra-axial collection or mass lesion/mass effect. Chronic atrophic changes and white matter ischemic changes are again seen. Findings of prior infarct in the right occipital lobe, right cerebellum and high near the vertex in the right parietal lobe. These are stable in appearance from the prior exam. Vascular: No hyperdense vessel or unexpected calcification. Skull: Normal. Negative for fracture or focal lesion. Sinuses/Orbits: No acute finding. Other: None. IMPRESSION: Chronic ischemic changes without acute abnormality. Electronically Signed   By: Oneil Devonshire M.D.   On: 07/17/2024 22:58    DG Chest Port 1 View Result Date: 07/17/2024 CLINICAL DATA:  Questionable sepsis - evaluate for abnormality EXAM: PORTABLE CHEST 1 VIEW COMPARISON:  06/02/2024 FINDINGS: Heart and mediastinal contours are within normal limits. No focal opacities or effusions. No acute bony abnormality. Aortic atherosclerosis. IMPRESSION: No active disease. Electronically Signed   By: Franky Crease M.D.   On: 07/17/2024 22:04   Hospital Plan per Almarie Nose NP H&P on 7.26.25 Sepsis with septic shock due to UTI Initial interventions/workup included: 30cc/kg IVF & Cefepime / Vancomycin / Metronidazole  meets SIRS criteria: Shock Index (SI): -F/u cultures, trend lactic/ PCT -Monitor WBC/ fever curve -IV  antibiotics: received Flagyl  & cefepime  & vancomycin . She has hx of klebsiella UTI will start on Ceftriaxone  -IVF hydration as needed -Start stress dose steroids -Pressors for MAP goal >65 -Strict I/O's   #Altered Mental Status likely worsened iso UTI Hx of DementiaCVAAnxiety -CT head neg -treat infection as above -Will do home clonazepam  -Start aspirin  once able to take p.o. -promote sleep-wake cycles ? #AKI on CKD stage III: AKI likely iso above #AGMA with Lactic acidosis #Hypernatremia -trend Lactate -F/u BMP+mag -promote renal perfusion -Avoid nephrotoxic agents  -Replete/correct lytes -strict I&Os   #Elevated Troponin likely demand #HTN #HLD No hx of CAD Pt in wide complex tachy likely iso high dose Levophed  per staff improved with decreasing dose -trend troponin until peaked -Hold amlodipine  and metoprolol  iso hypotension -Restart atorvastatin  once able to take p.o.   #FTT #Sacral Ulcer She uses a walker at baseline , recent poor po intake per son -PT/OT -Dietary consult -Palliative consult   #COPD without evidence of Exacerbation  -Supplemental oxygen as needed, maintain SpO2 > 88% -PRN and scheduled Bronchodilators   Discharge Planning:  ongoing-  This is patient's first day of admission. Family- None present IDT- Updated Goals of care-  Ongoing.  Patient is 89yo and remains a full code.    Hospice Detail Summary Report sent for upload to patient's medical records.  Please call with any hospice related questions or concerns.    [name] Na, MA, BSN, RN, FNE Nurse Liaison [phone]

## 2024-07-18 NOTE — Plan of Care (Signed)

## 2024-07-18 NOTE — ED Notes (Signed)
 Lab called to collect blood cultures.

## 2024-07-18 NOTE — ED Notes (Signed)
 Advised nurse that patient has ready bed

## 2024-07-18 NOTE — ED Notes (Signed)
Lab called to obtain second troponin

## 2024-07-18 NOTE — ED Notes (Signed)
 Lab unable to obtain blood cultures. Lab sending another phlebotomist to attempt.

## 2024-07-18 NOTE — Progress Notes (Signed)
 NAME:  Molly Parks, MRN:  [id number], DOB:  1928/11/16, LOS: 1 ADMISSION DATE:  07/17/2024, CHIEF COMPLAINT:  Septic Shock   History of Present Illness:   89 y.o female with significant PMH of dementia, HTN, COPD, anxiety, sacral ulcer, FTT, CKD stage III, CVA and recurrent Klebsiella UTI who presented to the ED with chief complaints of altered mental status   Per ED and patient's son report, the patient has experienced poor oral intake over the past several days. The son noted that the patient has been pocketing food and has had difficulty swallowing. Today, the patient appeared more lethargic and less responsive than usual. At [place], the patient is able to walk with a cane and perform ADLs with minimal assistance. The son mentioned that the patient typically acts like this when she has a UTI. During the last hospital visit, she suffered a stroke and was diagnosed with UTI. Since discharge with home hospice care, her functional capability has continued to decline. The son was concerned  today that her worsening mental status and unresponsiveness may be due to another UTI, so he called EMS. Upon EMS arrival, the patient was initially unresponsive, with a blood pressure reading of 52/30, which improved to 85/58 after administering 1 liter of fluids. The patient was started on a Levophed  drip, initiated at 4 micrograms and titrated up to 10, and was then transported to the ED.   ED Course: Initial vital signs showed Blood pressure (!) 53/37, pulse 96, temperature 98.7 F (37.1 C), temperature source Rectal, resp. rate 20, SpO2 100%. Pertinent Labs/Diagnostics Findings: Na+/ K+: 150/4.2 Glucose:158 BUN/Cr.:44/1.42 WBC:13.2K/L Plts:124 Lactic acid: 3.8 troponin: 142 CXR> CTH> CTA Chest> CT Abd/pelvis>neg Medication administered in the ZI:Ejupzwu given 30 cc/kg of fluids and started on broad-spectrum antibiotics Vanco cefepime  and Flagyl  for sepsis with septic shock. Patient remained hypotensive  despite IVF boluses therefore was started on Levophed . PCCM consulted.  Pertinent  Medical History  Dementia, HTN, COPD, anxiety, sacral ulcer, FTT, CKD stage III, CVA and recurrent Klebsiella UTI   Significant Hospital Events: Including procedures, antibiotic start and stop dates in addition to other pertinent events   7/26: admitted with septic shock 7/27: weaned off vasopressor support  Interim History / Subjective:  Awake but disoriented and encephalopathic  Objective    Blood pressure (!) 85/61, pulse 95, temperature (!) 97.2 F (36.2 C), resp. rate 15, height 6' (1.829 m), weight 48.4 kg, SpO2 100%.        Intake/Output Summary (Last 24 hours) at 07/18/2024 1429 Last data filed at 07/18/2024 1200 Gross per 24 hour  Intake 571.98 ml  Output --  Net 571.98 ml   Filed Weights   07/17/24 2200 07/18/24 1155  Weight: 49 kg 48.4 kg    Examination: Physical Exam Constitutional:      General: She is not in acute distress.    Appearance: She is ill-appearing.  Cardiovascular:     Rate and Rhythm: Normal rate and regular rhythm.     Pulses: Normal pulses.     Heart sounds: Normal heart sounds.  Pulmonary:     Effort: Pulmonary effort is normal.     Breath sounds: Normal breath sounds.  Neurological:     Mental Status: She is alert. She is disoriented.     Motor: Weakness present.      Assessment and Plan   89 year old female with history of recurrent UTI presenting with septic shock secondary to UTI.  #Septic Shock #UTI #Toxic Metabolic  Encephalopathy  #Dementia #AKI on CKD #AGMA #Sacral Ulcer  Neuro - avoid psychotropic medicines, she is awake but disoriented CV - septic shock secondary to UTI. Resuscitated with IV fluids, titrating nor-epi for MAP > 65 mmHg. Mild troponin elevation, will trend. Suspect non-ischemic myocardial injury due to stress of sepsis. Pulm - on room air, no active issues. History of COPD on nebulizers GI - NPO for now Renal - AKI  due to septic shock, mildly improved. Lactic acid elevated, will trend (difficult IV stick) Hem/Onc - on heparin  subQ for DVT prophylaxis Endo - will discontinue hydrocortisone  with improvement in shock. ID - Ceftriaxone  for UTI, recent Klebsiella in urine culture in June, will repeat today  Best Practice (right click and Reselect all SmartList Selections daily)   Diet/type: NPO DVT prophylaxis prophylactic heparin   Pressure ulcer(s): present on admission  GI prophylaxis: N/A Lines: N/A Foley:  N/A Code Status:  full code Last date of multidisciplinary goals of care discussion [07/18/2024]  Labs   CBC: Recent Labs  Lab 07/17/24 2119 07/18/24 0337  WBC 13.2* 15.2*  NEUTROABS 10.3*  --   HGB 12.7 13.0  HCT 42.0 41.3  MCV 89.7 86.4  PLT 124* 114*    Basic Metabolic Panel: Recent Labs  Lab 07/17/24 2119 07/18/24 0337  NA 150* 147*  K 4.2 3.7  CL 118* 115*  CO2 16* 18*  GLUCOSE 158* 236*  BUN 44* 44*  CREATININE 1.42* 1.23*  CALCIUM  8.4* 8.2*  MG  --  2.0  PHOS  --  2.5   GFR: Estimated Creatinine Clearance: 21.4 mL/min (A) (by C-G formula based on SCr of 1.23 mg/dL (H)). Recent Labs  Lab 07/17/24 2119 07/18/24 0337  PROCALCITON  --  0.36  WBC 13.2* 15.2*  LATICACIDVEN 3.8*  --     Liver Function Tests: Recent Labs  Lab 07/17/24 2119  AST 22  ALT 7  ALKPHOS 35*  BILITOT 1.1  PROT 5.3*  ALBUMIN 2.6*   No results for input(s): LIPASE, AMYLASE in the last 168 hours. No results for input(s): AMMONIA in the last 168 hours.  ABG No results found for: PHART, PCO2ART, PO2ART, HCO3, TCO2, ACIDBASEDEF, O2SAT   Coagulation Profile: Recent Labs  Lab 07/18/24 0337  INR 1.2    Cardiac Enzymes: No results for input(s): CKTOTAL, CKMB, CKMBINDEX, TROPONINI in the last 168 hours.  HbA1C: Hgb A1c MFr Bld  Date/Time Value Ref Range Status  06/03/2024 06:24 AM 5.8 (H) 4.8 - 5.6 % Final    Comment:    (NOTE)          Prediabetes: 5.7 - 6.4         Diabetes: >6.4         Glycemic control for adults with diabetes: <7.0     CBG: Recent Labs  Lab 07/18/24 1151  GLUCAP 181*    Review of Systems:   N/A  Past Medical History:  She,  has a past medical history of COPD (chronic obstructive pulmonary disease) (HCC), Dementia (HCC), Dislocation, shoulder closed, left, initial encounter, and Hypertension.   Surgical History:   Past Surgical History:  Procedure Laterality Date   OOPHORECTOMY Right      Social History:   reports that she has never smoked. She has never used smokeless tobacco. She reports that she does not drink alcohol and does not use drugs.   Family History:  Her family history includes Pulmonary embolism in her sister.   Allergies Allergies  Allergen Reactions  Codeine Other (See Comments)    Other Reaction: OTHER REACTION   Oxycodone Anaphylaxis   Ciprofloxacin     Clopidogrel Other (See Comments)    Didn't feel good   Penicillins     Has patient had a PCN reaction causing immediate rash, facial/tongue/throat swelling, SOB or lightheadedness with hypotension: Unknown Has patient had a PCN reaction causing severe rash involving mucus membranes or skin necrosis: Unknown Has patient had a PCN reaction that required hospitalization: Unknown Has patient had a PCN reaction occurring within the last 10 years: Unknown If all of the above answers are NO, then may proceed with Cephalosporin use.     Home Medications  Prior to Admission medications   Medication Sig Start Date End Date Taking? Authorizing Provider  Acetaminophen  500 MG capsule Take 500 mg by mouth every 6 (six) hours as needed.   Yes [provider]  amLODipine  (NORVASC ) 5 MG tablet Take 1 tablet (5 mg total) by mouth daily. 01/02/19  Yes Viviann Pastor, MD  bisacodyl  (DULCOLAX) 5 MG EC tablet Take 1 tablet (5 mg total) daily as needed by mouth for moderate constipation. 11/05/17  Yes Laurence Bridegroom, MD   calcitRIOL (ROCALTROL) 0.25 MCG capsule Take 0.25 mcg by mouth daily. 12/22/23 12/21/24 Yes [provider]  clonazePAM  (KLONOPIN ) 0.5 MG tablet Take 0.5 tablets (0.25 mg total) by mouth 2 (two) times daily as needed. Patient taking differently: Take 0.25 mg by mouth at bedtime as needed for anxiety. 03/11/18  Yes Barbette Cea, MD  metoprolol  tartrate (LOPRESSOR ) 25 MG tablet Take 0.5 tablets (12.5 mg total) 2 (two) times daily by mouth. 11/05/17  Yes Laurence Bridegroom, MD  naproxen (NAPROSYN) 500 MG tablet Take 500 mg by mouth 2 (two) times daily with a meal. 06/21/24 07/21/24 Yes [provider]  polyethylene glycol (MIRALAX  / GLYCOLAX ) packet Take 17 g by mouth daily as needed for mild constipation. 03/11/18  Yes Mody, Cea, MD  potassium chloride  (KLOR-CON ) 10 MEQ tablet Take 10 mEq by mouth daily.   Yes [provider]  aspirin  325 MG EC tablet Take 325 mg daily by mouth.    [provider]  atorvastatin  (LIPITOR) 20 MG tablet Take 1 tablet (20 mg total) by mouth daily. Patient not taking: Reported on 07/17/2024 06/04/24   Caleen Qualia, MD  feeding supplement (ENSURE PLUS HIGH PROTEIN) LIQD Take 237 mLs by mouth 2 (two) times daily between meals. 06/03/24   Caleen Qualia, MD     The patient is critically ill due to septic shock, UTI.  Critical care was necessary to treat or prevent imminent or life-threatening deterioration. Critical care time was spent by me on the following activities: development of a treatment plan with the patient and/or surrogate as well as nursing, discussions with consultants, evaluation of the patient's response to treatment, examination of the patient, obtaining a history from the patient or surrogate, ordering and performing treatments and interventions, ordering and review of laboratory studies, ordering and review of radiographic studies, review of telemetry data including pulse oximetry, re-evaluation of patient's condition and participation  in multidisciplinary rounds.   I personally spent 34 minutes providing critical care not including any separately billable procedures.   Belva November, MD [facility] 07/18/2024 2:35 PM

## 2024-07-18 NOTE — Progress Notes (Signed)
 PHARMACY CONSULT NOTE - FOLLOW UP  Pharmacy Consult for Electrolyte Monitoring and Replacement   Recent Labs: Potassium (mmol/L)  Date Value  07/18/2024 3.7  11/14/2012 3.7   Magnesium  (mg/dL)  Date Value  92/72/7974 2.0   Calcium  (mg/dL)  Date Value  92/72/7974 8.2 (L)   Calcium , Total (mg/dL)  Date Value  88/76/7986 9.2   Albumin (g/dL)  Date Value  92/73/7974 2.6 (L)  11/14/2012 4.1   Phosphorus (mg/dL)  Date Value  92/72/7974 2.5   Sodium (mmol/L)  Date Value  07/18/2024 147 (H)  11/14/2012 136     Assessment: 89 y.o female with significant PMH of dementia, HTN, COPD, anxiety, sacral ulcer, FTT, CKD stage III, CVA and recurrent Klebsiella UTI who presented to the ED with chief complaints of altered mental status. Pharmacy is asked to follow and replace electrolytes while in CCU   Goal of Therapy:  Electrolytes WNL  Plan:  ---no electrolyte replacement warranted for today ---recheck electrolytes in am  Molly Parks ,PharmD Clinical Pharmacist 07/18/2024 11:56 AM

## 2024-07-19 DIAGNOSIS — A419 Sepsis, unspecified organism: Secondary | ICD-10-CM | POA: Diagnosis not present

## 2024-07-19 DIAGNOSIS — N179 Acute kidney failure, unspecified: Secondary | ICD-10-CM | POA: Diagnosis not present

## 2024-07-19 DIAGNOSIS — E43 Unspecified severe protein-calorie malnutrition: Secondary | ICD-10-CM | POA: Insufficient documentation

## 2024-07-19 DIAGNOSIS — R627 Adult failure to thrive: Secondary | ICD-10-CM

## 2024-07-19 DIAGNOSIS — E8729 Other acidosis: Secondary | ICD-10-CM

## 2024-07-19 DIAGNOSIS — N390 Urinary tract infection, site not specified: Secondary | ICD-10-CM | POA: Diagnosis not present

## 2024-07-19 LAB — BASIC METABOLIC PANEL WITH GFR
Anion gap: 13 (ref 5–15)
BUN: 41 mg/dL — ABNORMAL HIGH (ref 8–23)
CO2: 22 mmol/L (ref 22–32)
Calcium: 8 mg/dL — ABNORMAL LOW (ref 8.9–10.3)
Chloride: 113 mmol/L — ABNORMAL HIGH (ref 98–111)
Creatinine, Ser: 1.35 mg/dL — ABNORMAL HIGH (ref 0.44–1.00)
GFR, Estimated: 36 mL/min — ABNORMAL LOW (ref >60)
Glucose, Bld: 113 mg/dL — ABNORMAL HIGH (ref 70–99)
Potassium: 3.6 mmol/L (ref 3.5–5.1)
Sodium: 148 mmol/L — ABNORMAL HIGH (ref 135–145)

## 2024-07-19 LAB — LACTIC ACID, PLASMA
Lactic Acid, Venous: 3.5 mmol/L (ref 0.5–1.9)
Lactic Acid, Venous: 2.7 mmol/L (ref 0.5–1.9)

## 2024-07-19 LAB — CBC
HCT: 37.4 % (ref 36.0–46.0)
Hemoglobin: 12.3 g/dL (ref 12.0–15.0)
MCH: 27.3 pg (ref 26.0–34.0)
MCHC: 32.9 g/dL (ref 30.0–36.0)
MCV: 83.1 fL (ref 80.0–100.0)
Platelets: 92 K/uL — ABNORMAL LOW (ref 150–400)
RBC: 4.5 MIL/uL (ref 3.87–5.11)
RDW: 20.5 % — ABNORMAL HIGH (ref 11.5–15.5)
WBC: 12.9 K/uL — ABNORMAL HIGH (ref 4.0–10.5)
nRBC: 0.5 % — ABNORMAL HIGH (ref 0.0–0.2)

## 2024-07-19 LAB — MAGNESIUM: Magnesium: 1.8 mg/dL (ref 1.7–2.4)

## 2024-07-19 MED ORDER — MAGNESIUM SULFATE 2 GM/50ML IV SOLN
2.0000 g | Freq: Once | INTRAVENOUS | Status: AC
  Administered 2024-07-19: 2 g via INTRAVENOUS
  Filled 2024-07-19: qty 50

## 2024-07-19 MED ORDER — LACTATED RINGERS IV BOLUS
500.0000 mL | Freq: Once | INTRAVENOUS | Status: AC
  Administered 2024-07-19: 500 mL via INTRAVENOUS

## 2024-07-19 MED ORDER — ACETAMINOPHEN 10 MG/ML IV SOLN
1000.0000 mg | Freq: Four times a day (QID) | INTRAVENOUS | Status: AC
  Administered 2024-07-19 – 2024-07-20 (×4): 1000 mg via INTRAVENOUS
  Filled 2024-07-19 (×4): qty 100

## 2024-07-19 NOTE — Plan of Care (Signed)
 Referral received  to assist with goals of care discussion.   Upon review of chart, patient noted to be a current [facility] hospice patient with [facility] liaison visit documented 07/18/2024.  Collaborated with [facility] liaison who recommends no palliative needs at this time since [facility] is actively following.   Palliative to sign off at this time due due to no needs per [facility] liaison.  Please contact the palliative medicine provider on service for any new/urgent needs that require our assistance with this patient.   Thank you for your referral and allowing PMT to assist in Ms. Mcneill's care.     Devere Sacks, ELNITA- [facility] Palliative Medicine Team  07/19/2024 10:58 AM  Office [phone]  Pager [phone]

## 2024-07-19 NOTE — Progress Notes (Signed)
 Initial Nutrition Assessment  DOCUMENTATION CODES:   Severe malnutrition in context of chronic illness, Underweight  INTERVENTION:   -RD will make further recommendations based upon diet advancement and goals of care  NUTRITION DIAGNOSIS:   Severe Malnutrition related to chronic illness (COPD and dementia) as evidenced by percent weight loss, severe fat depletion, severe muscle depletion.  GOAL:   Patient will meet greater than or equal to 90% of their needs  MONITOR:   Diet advancement  REASON FOR ASSESSMENT:   Rounds    ASSESSMENT:   Pt with significant PMH of dementia, HTN, COPD, anxiety, sacral ulcer, FTT, CKD stage III, CVA and recurrent Klebsiella UTI who presented with chief complaints of altered mental status  Pt admitted with septic shock due to UTI.   7/28- s/p BSE- NPO  Reviewed I/O's: +186 ml x 24 hours  UOP: 675 ml x 24 hours  Case discussed extensively with RN, palliative care, MD, TOC, and SLP. Per SLP, pt unsafe for oral intake at this time; SLP will continue to re-evaluate, however, suspects that pt may need alternative means of nutrition and hydration if unable to advance to PO intake in the next few days.   Per H&P, pt son reports pt with poor oral intake over the past few days PTA along with difficulty swallowing and pocketing food. He reports pt often experiences these symptoms during UTI. At baseline, pt walks with a cane and has minimal assistance with ADLs.   Pt lying in bed with eyes closed at time of visit. No family at bedside. Pt awoke to voice and made eye contact with RD when spoken to, however, unable to contribute significant history. Pt motioned RD away when examining legs.   Reviewed wt hx; pt has experienced a 16.9% wt loss over the past 4 months, which is significant for time frame.   Noted pt was followed by home health hospice PTA. Given pt's advanced age, dementia, and COPD, suspect malnutrition and poor oral intake is chronic.  Increased energy expenditure secondary to COPD and advanced dementia also contributing to malnutrition. Pt is at high refeeding risk. Pt also with chronic wounds; pt would require additional nutrition to support healing and is at risk for micronutrient deficiencies for chronic wounds; RD will hold off on drawing labs for supplementation until goals of care are established. RD hesitant to recommend alternative means of nutrition/ hydration given pt's hospice status as well as advanced age/ dementia; TF would not enhance pt's quality of life. Findings and recommendations discussed with team. MD plans to discuss Goals of care time family. Palliative care and hospice also following.   Medications reviewed and include levophed .   Labs reviewed: Na: 141, K, Mg, and Phos WDL. CBGS: 181 (inpatient orders for glycemic control are none).    NUTRITION - FOCUSED PHYSICAL EXAM:  Flowsheet Row Most Recent Value  Orbital Region Severe depletion  Upper Arm Region Severe depletion  Thoracic and Lumbar Region Severe depletion  Buccal Region Severe depletion  Temple Region Severe depletion  Clavicle Bone Region Severe depletion  Clavicle and Acromion Bone Region Severe depletion  Scapular Bone Region Severe depletion  Dorsal Hand Severe depletion  Patellar Region Severe depletion  Anterior Thigh Region Severe depletion  Posterior Calf Region Severe depletion  Edema (RD Assessment) None  Hair Reviewed  Eyes Reviewed  Mouth Reviewed  Skin Reviewed  Nails Reviewed    Diet Order:   Diet Order  Diet NPO time specified  Diet effective now                   EDUCATION NEEDS:   Not appropriate for education at this time  Skin:  Skin Assessment: Skin Integrity Issues: Skin Integrity Issues:: Stage II, Other (Comment), DTI DTI: bilateral heels Stage II: rt/ lt sacrum Other: wound to rt lower buttocks  Last BM:  07/19/24 (type 6)  Height:   Ht Readings from Last 1 Encounters:   07/18/24 6' (1.829 m)    Weight:   Wt Readings from Last 1 Encounters:  07/19/24 49 kg    Ideal Body Weight:  72.7 kg  BMI:  Body mass index is 14.65 kg/m.  Estimated Nutritional Needs:   Kcal:  1750-1950  Protein:  90-105 grams  Fluid:  1.7-1.9 L    Margery ORN, RD, LDN, CDCES Registered Dietitian III Certified Diabetes Care and Education Specialist If unable to reach this RD, please use RD Inpatient group chat on secure chat between hours of 8am-4 pm daily

## 2024-07-19 NOTE — Progress Notes (Signed)
 PHARMACY CONSULT NOTE  Pharmacy Consult for Electrolyte Monitoring and Replacement   Recent Labs: Potassium (mmol/L)  Date Value  07/19/2024 3.6  11/14/2012 3.7   Magnesium  (mg/dL)  Date Value  92/71/7974 1.8   Calcium  (mg/dL)  Date Value  92/71/7974 8.0 (L)   Calcium , Total (mg/dL)  Date Value  88/76/7986 9.2   Albumin (g/dL)  Date Value  92/73/7974 2.6 (L)  11/14/2012 4.1   Phosphorus (mg/dL)  Date Value  92/72/7974 2.5   Sodium (mmol/L)  Date Value  07/19/2024 148 (H)  11/14/2012 136   Assessment: 89 y.o female with significant PMH of dementia, HTN, COPD, anxiety, sacral ulcer, FTT, CKD stage III, CVA and recurrent Klebsiella UTI who presented to the ED with chief complaints of altered mental status. Pharmacy is asked to follow and replace electrolytes while in CCU   Goal of Therapy:  Electrolytes WNL  Plan:  --Na 148, fluid bolus ordered --Mg 1.8, magnesium  sulfate 2 g IV x 1 --Re-check electrolytes in AM  Molly Parks 07/19/2024 8:07 AM

## 2024-07-19 NOTE — TOC Initial Note (Signed)
 Transition of Care [facility]) - Initial/Assessment Note    Patient Details  Name: Molly Parks MRN: [id number] Date of Birth: Jan 20, 1928  [facility] CM/SW Contact:    Corrie JINNY Ruts, LCSW Phone Number: 07/19/2024, 2:15 PM  Clinical Narrative:                 Chart reviewed.Per chart the patient is current active with Home health hospice with [facility] care. TOC will follow up with the patient upon discharge.         Patient Goals and CMS Choice            Expected Discharge Plan and Services   Discharge Planning Services: CM Consult                                          Prior Living Arrangements/Services              Need for Family Participation in Patient Care: Yes (Comment) Care giver support system in place?: Yes (comment)   Criminal Activity/Legal Involvement Pertinent to Current Situation/Hospitalization: No - Comment as needed  Activities of Daily Living   ADL Screening (condition at time of admission) Independently performs ADLs?: No Does the patient have a NEW difficulty with bathing/dressing/toileting/self-feeding that is expected to last >3 days?: No Does the patient have a NEW difficulty with getting in/out of bed, walking, or climbing stairs that is expected to last >3 days?: No Does the patient have a NEW difficulty with communication that is expected to last >3 days?: No Is the patient deaf or have difficulty hearing?: No Does the patient have difficulty seeing, even when wearing glasses/contacts?: No Does the patient have difficulty concentrating, remembering, or making decisions?: Yes  Permission Sought/Granted                  Emotional Assessment           Psych Involvement: No (comment)  Admission diagnosis:  Severe sepsis with acute organ dysfunction (HCC) [A41.9, R65.20] Hypotension, unspecified hypotension type [I95.9] Altered mental status, unspecified altered mental status type [R41.82] Patient Active  Problem List   Diagnosis Date Noted   Severe sepsis with acute organ dysfunction (HCC) 07/17/2024   Stroke (HCC) 06/03/2024   Elevated troponin 06/03/2024   Urinary tract infection without hematuria 06/03/2024   Protein-calorie malnutrition, moderate (HCC) 06/02/2024   Myocardial injury 06/02/2024   Dementia without behavioral disturbance (HCC) 06/02/2024   Chronic kidney disease, stage 3b (HCC) 06/02/2024   Dehydration 06/02/2024   COPD (chronic obstructive pulmonary disease) (HCC) 06/02/2024   Anxiety 06/02/2024   Leukocytosis 06/02/2024   Thrombocytopenia (HCC) 06/02/2024   Acute metabolic encephalopathy 06/02/2024   Sacral ulcer (HCC) 06/02/2024   AKI (acute kidney injury) (HCC) 08/14/2022   Weakness 08/14/2022   Essential hypertension 08/14/2022   CKD (chronic kidney disease) stage 3, GFR 30-59 ml/min (HCC) 08/14/2022   Cognitive decline 08/14/2022   Nausea 08/14/2022   Hypokalemia 08/14/2022   GIB (gastrointestinal bleeding) 03/10/2018   Rhabdomyolysis 11/03/2017   PCP:  Eliverto Bette Hover, MD Pharmacy:   [facility] [phone] [place], [place] - [address] AT [facility] OF [address] [place] [address] Phone: [phone] Fax: [phone]     Social Drivers of Health (SDOH) Social History: SDOH Screenings   Food Insecurity: No Food Insecurity (07/18/2024)  Housing: Low Risk  (07/18/2024)  Transportation Needs: No Transportation Needs (07/18/2024)  Utilities: Not At Risk (07/18/2024)  Social Connections: Patient Declined (07/18/2024)  Tobacco Use: Low Risk  (07/18/2024)   SDOH Interventions:     Readmission Risk Interventions     No data to display

## 2024-07-19 NOTE — Progress Notes (Signed)
 MD aware pt has no IV access. Pt's BP 65/49 (54). Awaiting STAT IV team order.

## 2024-07-19 NOTE — Progress Notes (Signed)
 [facility] Room IC 6 [facility] Hospitalized Hospice Patient   [name] is a current hospice patient we follow at home for terminal diagnosis of CVD.  Family called EMS with complaint of increased AMS.  Patient was admitted to [facility] on 7.26.25 with Dx of sepsis with septic shock related to UTI.  Per Dr. Norleen Laurence, this is a related hospital admission.   Visited patient at the bedside. Patient was sleeping when HL visited.      Patient is appropriate for GIP level of care requiring critical care monitoring and intervention to treat hospital problems listed below.     Vital Signs: 97,5,  105/80,  109,  24  93% on room air   Abnormal Labs: sodium 148, Chloride 113, Glucose 113 BUN 41, Creatinine 1.35, Calcium  8.0 GFR 36, Lactic Acid 2.7,  WBC 12.9,  RDW 20.5, Platelets 92.    IV/PRN Meds OFIRMEV  1,000 mg q 6hr IV Rocephin  1g IV every day Levophed  4mg /254ml  4mcg/min continuous gtt Mag sulfate IVPB 2g 50 ML IV once today.       I&O 860.7/675   Diagnostics: None new.       Hospital Plan per Darrin Barn, MD 7.28.25 89 year old female with history of recurrent UTI presenting with septic shock secondary to UTI.   #Septic Shock secondary to (Resolved) #UTI #Toxic Metabolic Encephalopathy  #Dementia #AKI on CKD baseline Cr. 0.88 to 1.0 mg/dl #AGMA, lactic acidosis #Sacral Ulcer #Failure to thrive    Neuro - avoid psychotropic medicines, she is awake but disoriented CV - septic shock secondary to UTI. Resuscitated with IV fluids, titrating nor-epi for MAP > 65 mmHg. Mild troponin elevation, will trend. Suspect non-ischemic myocardial injury due to stress of sepsis. Pulm - on room air, no active issues. History of COPD on nebulizers GI - NPO for now. Speech eval.  Renal - AKI due to septic shock, mildly improved. Lactic acid elevated, will trend (difficult IV stick) improving.  Hem/Onc - on heparin  subQ for DVT prophylaxis Endo - will discontinue  hydrocortisone  with improvement in shock. ID - Ceftriaxone  for UTI, recent Klebsiella in urine culture in   Discharge Planning:  ongoing Family- None present-Spoke to son via phone.   IDT- Updated Goals of care-  Ongoing.  Patient is 89yo and remains a full code. Son does not seem to be on Hospice page completely at this time.     Hospice Detail Summary Report sent for upload to patient's medical records.   Please call with any hospice related questions or concerns.    [facility] Liaison [phone]

## 2024-07-19 NOTE — Evaluation (Addendum)
 Clinical/Bedside Swallow Evaluation Patient Details  Name: Molly Parks MRN: [id number] Date of Birth: [birth year]  Today's Date: 07/19/2024 Time: SLP Start Time (ACUTE ONLY): 1000 SLP Stop Time (ACUTE ONLY): 1100 SLP Time Calculation (min) (ACUTE ONLY): 60 min  Past Medical History:  Past Medical History:  Diagnosis Date   COPD (chronic obstructive pulmonary disease) (HCC)    Dementia (HCC)    Dislocation, shoulder closed, left, initial encounter    Hypertension    Past Surgical History:  Past Surgical History:  Procedure Laterality Date   OOPHORECTOMY Right    HPI:  Pt is a 89yo female w/ a PMH of Dementia, Severe Malnutrition, HTN, COPD, anxiety, sacral ulcers, FTT,  HOH, CVD, CKD stage III, CVA and recurrent Klebsiella UTI who presented to the ED with chief complaints of altered mental status.  Per ED and patient's son report, the patient has had poor oral intake.  The son noted that the patient has been pocketing food and has had difficulty swallowing.  Today, the patient appeared more lethargic and less responsive than usual. Per report, the patient is able to walk with a cane and perform ADLs with minimal assistance.. The son mentioned that the patient typically acts like this when she has a UTI.[name]     Chest CT: Lungs/Pleura: Lungs are well aerated bilaterally. 6 mm nodule is  noted in the posterior aspect of the right upper lobe. This occurs  in an area of previous ground-glass opacity. Few scattered smaller  less than 4 mm nodules are noted throughout both lungs. Bibasilar  atelectasis is noted.  Head CT: Brain: No evidence of acute infarction, hemorrhage, hydrocephalus,  extra-axial collection or mass lesion/mass effect. Chronic, Moderately Advanced Atrophic changes and white matter ischemic changes are again seen. Findings  of prior infarct in the right occipital lobe, right cerebellum and  high near the vertex in the right parietal lobe. These are stable in  appearance from the  prior exam.     Assessment / Plan / Recommendation  Clinical Impression   Pt seen for BSE this AM. Pt awake, eyes open and looking around especially when distracted by others in room. Pt stated few words intermittently indicating her wish to try a food, then to say No to further po's presented- she also wave her hand to push the food away when she didn't want it. Pt is HOH. She did not readily follow any instructions; MOD+ cues needed for follow through. Pt has Baseline Dementia w/ FTT and Severe Malnutrition per chart. Pt is of Advanced Age and requires support w/ ADLs in the home.  On RA, afebrile. WBC 12.9. Pt is Cachectic appearing w/ dx'd FTT; Severe Malnutrition- BMI 14.65 per chart.  Pt appears to present w/ oropharyngeal phase Dysphagia in setting of declined Cognitive status, Baseline Dementia, as well as other medical comorbidities. Family has reported that pt has had decreased oral intake; pt has dx'd FTT, Severe Malnutrition also per chart. ANY Cognitive decline can impact overall awareness/timing of swallowing thus safety during po tasks which increases risk for aspiration/aspiration pneumonia. Pt has Multiple comorbidities also impacting safety w/ oral intake(see chart). Pt does not appear safe w/ oral intake at this time- an oral diet is NOT recommended today.        Pt required MAX assist in sitting upright in bed for po intake. She required FULL FEEDING assistance w/ the TSP trials given. Pt consumed trials of Single ice chips, purees, Nectar liquids via TSP w/ No overt  s/s of aspiration but unsure of how much of each bolus was swallowed d/t the expectoration and leakage the occurred w/ trials post oral holding. No decline in vocal quality, no cough, and no decline in respiratory status during/post trials occurred. Of Note was reduced laryngeal excursion/movement during pharyngeal swallowing; also suspect delay in pharyngeal swallow initiation. Severe oral phase deficits were noted c/b  poor awareness of bolus, reduced lingual movement/coordination, prolonged oral transit, oral holding, and oral residue w/ Expectoration and leakage of bolus material/residue. Pt required full feeding assistance.  OM Exam was cursory w/ generalized oral weakness noted but no overt/gross unilateral weakness noted.          In setting of presentation w/ oral intake at this assessment today, recommend Strict NPO status w/ frequent oral care for hygiene and stimulation of swallowing. Aspiration precautions. Engage pt is performing the oral care also. ST services will f/u w/ ongoing assessment of swallowing; trials to hopefully establish and oral diet. MD/NSG/Team updated.  ST services recommends follow w/ Hospice services (pt is ongoing w/ their services) in order to discuss GOC in setting of her FTT, Severe Malnutrition, Advanced Age, and Dementia. Education/information is recommended for the Family re: the impact of Cognitive decline/Dementia on swallowing and being able to meet her nutrition/hydration needs orally. Recommend f/u w/ Dietician for further. Precautions posted in room, chart. SLP Visit Diagnosis: Dysphagia, oropharyngeal phase (R13.12) (Dementia; advanced age; FTT w/ severe Malnutrition)    Aspiration Risk  Severe aspiration risk;Risk for inadequate nutrition/hydration    Diet Recommendation   NPO (oral care)  Medication Administration: Via alternative means    Other  Recommendations Recommended Consults:  (Hospice f/u(ongoing per chart); Dietician f/u) Oral Care Recommendations: Oral care QID;Staff/trained caregiver to provide oral care Caregiver Recommendations:  (tbd)     Assistance Recommended at Discharge  FULL  Functional Status Assessment Patient has had a recent decline in their functional status and/or demonstrates limited ability to make significant improvements in function in a reasonable and predictable amount of time  Frequency and Duration min 2x/week  2 weeks        Prognosis Prognosis for improved oropharyngeal function: Guarded Barriers to Reach Goals: Cognitive deficits;Language deficits;Time post onset;Severity of deficits Barriers/Prognosis Comment: Dementia; advanced Age; FTT w/ severe Malnutrition      Swallow Study   General Date of Onset: 07/17/24 HPI: Pt is a 89yo female w/ a PMH of Dementia, Severe Malnutrition, HTN, COPD, anxiety, sacral ulcers, FTT,  HOH, CVD, CKD stage III, CVA and recurrent Klebsiella UTI who presented to the ED with chief complaints of altered mental status.  Per ED and patient's son report, the patient has had poor oral intake.  The son noted that the patient has been pocketing food and has had difficulty swallowing.  Today, the patient appeared more lethargic and less responsive than usual. Per report, the patient is able to walk with a cane and perform ADLs with minimal assistance.. The son mentioned that the patient typically acts like this when she has a UTI.[name]    Chest CT: Lungs/Pleura: Lungs are well aerated bilaterally. 6 mm nodule is  noted in the posterior aspect of the right upper lobe. This occurs  in an area of previous ground-glass opacity. Few scattered smaller  less than 4 mm nodules are noted throughout both lungs. Bibasilar  atelectasis is noted.  Head CT: Brain: No evidence of acute infarction, hemorrhage, hydrocephalus,  extra-axial collection or mass lesion/mass effect. Chronic, Moderately Advanced Atrophic changes and  white matter ischemic changes are again seen. Findings  of prior infarct in the right occipital lobe, right cerebellum and  high near the vertex in the right parietal lobe. These are stable in  appearance from the prior exam. Type of Study: Bedside Swallow Evaluation Previous Swallow Assessment: BSE 05/2011- pureed, thins Diet Prior to this Study: NPO Temperature Spikes Noted: No (wbc 12.9) Respiratory Status: Room air History of Recent Intubation: No Behavior/Cognition:  Alert;Cooperative;Pleasant mood;Confused;Distractible;Requires cueing;Doesn't follow directions Oral Cavity Assessment: Dry Oral Care Completed by SLP: Yes Oral Cavity - Dentition: Poor condition;Missing dentition (only has few anterior, lower Dentition; no upper denture plate) Vision:  (n/a) Self-Feeding Abilities: Total assist Patient Positioning: Upright in bed (full assist) Baseline Vocal Quality: Low vocal intensity (only few words) Volitional Cough: Cognitively unable to elicit Volitional Swallow: Unable to elicit    Oral/Motor/Sensory Function Overall Oral Motor/Sensory Function: Generalized oral weakness (no overt/gross unilateral OM weakness nor asymmetry)   Ice Chips Ice chips: Impaired Presentation: Spoon (fed; 3 trials) Oral Phase Impairments: Poor awareness of bolus;Reduced lingual movement/coordination Oral Phase Functional Implications: Prolonged oral transit;Oral residue (leakage) Pharyngeal Phase Impairments: Suspected delayed Swallow;Decreased hyoid-laryngeal movement (no coughing)   Thin Liquid Thin Liquid: Not tested    Nectar Thick Nectar Thick Liquid: Impaired Presentation: Spoon (9 trials (1/2 tsp size)) Oral Phase Impairments: Poor awareness of bolus;Reduced lingual movement/coordination Oral phase functional implications: Prolonged oral transit;Oral residue (leakage) Pharyngeal Phase Impairments: Suspected delayed Swallow;Decreased hyoid-laryngeal movement   Honey Thick Honey Thick Liquid: Not tested   Puree Puree: Impaired Presentation: Spoon (fed; 6 trials (1/2 tsp size)) Oral Phase Impairments: Reduced lingual movement/coordination;Poor awareness of bolus Oral Phase Functional Implications: Prolonged oral transit;Oral residue;Oral holding (leakage) Pharyngeal Phase Impairments: Suspected delayed Swallow;Decreased hyoid-laryngeal movement   Solid     Solid: Not tested        [name], MS, CCC-SLP Speech Language Pathologist Rehab Services;  [facility] [phone] (ascom) Shantina Chronister 07/19/2024,2:13 PM

## 2024-07-19 NOTE — Progress Notes (Signed)
 NAME:  Molly Parks, MRN:  [id number], DOB:  Aug 18, 1928, LOS: 2 ADMISSION DATE:  07/17/2024 History of Present Illness:  89 y.o female with significant PMH of dementia, HTN, COPD, anxiety, sacral ulcer, FTT, CKD stage III, CVA and recurrent Klebsiella UTI who presented to the ED with chief complaints of altered mental status   Per ED and patient's son report, the patient has experienced poor oral intake over the past several days. The son noted that the patient has been pocketing food and has had difficulty swallowing. Today, the patient appeared more lethargic and less responsive than usual. At [place], the patient is able to walk with a cane and perform ADLs with minimal assistance. The son mentioned that the patient typically acts like this when she has a UTI. During the last hospital visit, she suffered a stroke and was diagnosed with UTI. Since discharge with home hospice care, her functional capability has continued to decline. The son was concerned  today that her worsening mental status and unresponsiveness may be due to another UTI, so he called EMS. Upon EMS arrival, the patient was initially unresponsive, with a blood pressure reading of 52/30, which improved to 85/58 after administering 1 liter of fluids. The patient was started on a Levophed  drip, initiated at 4 micrograms and titrated up to 10, and was then transported to the ED.   ED Course: Initial vital signs showed Blood pressure (!) 53/37, pulse 96, temperature 98.7 F (37.1 C), temperature source Rectal, resp. rate 20, SpO2 100%. Pertinent Labs/Diagnostics Findings: Na+/ K+: 150/4.2 Glucose:158 BUN/Cr.:44/1.42 WBC:13.2K/L Plts:124 Lactic acid: 3.8 troponin: 142 CXR> CTH> CTA Chest> CT Abd/pelvis>neg Medication administered in the ZI:Ejupzwu given 30 cc/kg of fluids and started on broad-spectrum antibiotics Vanco cefepime  and Flagyl  for sepsis with septic shock. Patient remained hypotensive despite IVF boluses therefore was  started on Levophed . PCCM consulted.  Pertinent  Medical History  Dementia, HTN, COPD, anxiety, sacral ulcer, FTT, CKD stage III, CVA and recurrent Klebsiella UTI   Significant Hospital Events: Including procedures, antibiotic start and stop dates in addition to other pertinent events   7/26: admitted with septic shock 7/27: weaned off vasopressor support 7/28: [name] remains off pressors.   Interim History / Subjective:  Frail, non coherent.  Objective    Blood pressure 105/80, pulse (!) 109, temperature (!) 97.5 F (36.4 C), temperature source Oral, resp. rate (!) 24, height 6' (1.829 m), weight 49 kg, SpO2 93%.        Intake/Output Summary (Last 24 hours) at 07/19/2024 1107 Last data filed at 07/19/2024 1105 Gross per 24 hour  Intake 1226.43 ml  Output 675 ml  Net 551.43 ml   Filed Weights   07/17/24 2200 07/18/24 1155 07/19/24 0411  Weight: 49 kg 48.4 kg 49 kg    Examination: General: NAD HENT: Supple neck, reactive pupils, EOMI Lungs: Clear bilateral air entry Cardiovascular: Normal S1, Normal S2, RRR Abdomen: Soft, non tender, non distended, +BS Extremities: Warm and well perfused, no edema  Labs and imaging were reviewed.   Assessment and Plan  89 year old female with history of recurrent UTI presenting with septic shock secondary to UTI.   #Septic Shock secondary to (Resolved) #UTI #Toxic Metabolic Encephalopathy  #Dementia #AKI on CKD baseline Cr. 0.88 to 1.0 mg/dl #AGMA, lactic acidosis #Sacral Ulcer #Failure to thrive    Neuro - avoid psychotropic medicines, she is awake but disoriented CV - septic shock secondary to UTI. Resuscitated with IV fluids, titrating nor-epi for MAP >  65 mmHg. Mild troponin elevation, will trend. Suspect non-ischemic myocardial injury due to stress of sepsis. Pulm - on room air, no active issues. History of COPD on nebulizers GI - NPO for now. Speech eval.  Renal - AKI due to septic shock, mildly improved. Lactic acid  elevated, will trend (difficult IV stick) improving.  Hem/Onc - on heparin  subQ for DVT prophylaxis Endo - will discontinue hydrocortisone  with improvement in shock. ID - Ceftriaxone  for UTI, recent Klebsiella in urine culture in June.   GOC: Palliative were contacted however given that she is actively being  followed by hospice we will try to engage their liaison.   Best Practice (right click and Reselect all SmartList Selections daily)   Diet/type: NPO DVT prophylaxis prophylactic heparin   Pressure ulcer(s): present on admission  GI prophylaxis: N/A Lines: N/A Foley:  N/A Code Status:  full code  Last date of multidisciplinary goals of care discussion [07/19/2024]  Critical care time: 60 minutes    Darrin Barn, MD  Pulmonary Critical Care 07/19/2024 11:22 AM

## 2024-07-19 NOTE — Plan of Care (Signed)
  Problem: Clinical Measurements: Goal: Respiratory complications will improve Outcome: Progressing   Problem: Coping: Goal: Level of anxiety will decrease Outcome: Progressing   Problem: Elimination: Goal: Will not experience complications related to bowel motility Outcome: Progressing Goal: Will not experience complications related to urinary retention Outcome: Progressing   Problem: Education: Goal: Knowledge of General Education information will improve Description: Including pain rating scale, medication(s)/side effects and non-pharmacologic comfort measures Outcome: Not Progressing   Problem: Health Behavior/Discharge Planning: Goal: Ability to manage health-related needs will improve Outcome: Not Progressing   Problem: Activity: Goal: Risk for activity intolerance will decrease Outcome: Not Progressing   Problem: Nutrition: Goal: Adequate nutrition will be maintained Outcome: Not Progressing

## 2024-07-19 NOTE — Care Management Important Message (Signed)
 Important Message  Patient Details  Name: Molly Parks MRN: [id number] Date of Birth: 07-13-1928   Important Message Given:  Yes - Medicare IM     Molly Parks 07/19/2024, 1:30 PM

## 2024-07-20 DIAGNOSIS — N179 Acute kidney failure, unspecified: Secondary | ICD-10-CM | POA: Diagnosis not present

## 2024-07-20 DIAGNOSIS — N390 Urinary tract infection, site not specified: Secondary | ICD-10-CM | POA: Diagnosis not present

## 2024-07-20 DIAGNOSIS — E8729 Other acidosis: Secondary | ICD-10-CM | POA: Diagnosis not present

## 2024-07-20 DIAGNOSIS — A419 Sepsis, unspecified organism: Secondary | ICD-10-CM | POA: Diagnosis not present

## 2024-07-20 LAB — BASIC METABOLIC PANEL WITH GFR
Anion gap: 11 (ref 5–15)
BUN: 40 mg/dL — ABNORMAL HIGH (ref 8–23)
CO2: 23 mmol/L (ref 22–32)
Calcium: 8 mg/dL — ABNORMAL LOW (ref 8.9–10.3)
Chloride: 113 mmol/L — ABNORMAL HIGH (ref 98–111)
Creatinine, Ser: 1.14 mg/dL — ABNORMAL HIGH (ref 0.44–1.00)
GFR, Estimated: 45 mL/min — ABNORMAL LOW (ref >60)
Glucose, Bld: 97 mg/dL (ref 70–99)
Potassium: 3 mmol/L — ABNORMAL LOW (ref 3.5–5.1)
Sodium: 147 mmol/L — ABNORMAL HIGH (ref 135–145)

## 2024-07-20 LAB — GLUCOSE, CAPILLARY
Glucose-Capillary: 84 mg/dL (ref 70–99)
Glucose-Capillary: 79 mg/dL (ref 70–99)
Glucose-Capillary: 97 mg/dL (ref 70–99)
Glucose-Capillary: 84 mg/dL (ref 70–99)
Glucose-Capillary: 86 mg/dL (ref 70–99)

## 2024-07-20 LAB — URINE CULTURE: Culture: >=100000 — AB

## 2024-07-20 LAB — MAGNESIUM: Magnesium: 2.7 mg/dL — ABNORMAL HIGH (ref 1.7–2.4)

## 2024-07-20 LAB — CBC
HCT: 37.4 % (ref 36.0–46.0)
Hemoglobin: 12.5 g/dL (ref 12.0–15.0)
MCH: 27.2 pg (ref 26.0–34.0)
MCHC: 33.4 g/dL (ref 30.0–36.0)
MCV: 81.3 fL (ref 80.0–100.0)
Platelets: 106 K/uL — ABNORMAL LOW (ref 150–400)
RBC: 4.6 MIL/uL (ref 3.87–5.11)
RDW: 20.2 % — ABNORMAL HIGH (ref 11.5–15.5)
WBC: 11.1 K/uL — ABNORMAL HIGH (ref 4.0–10.5)
nRBC: 0.4 % — ABNORMAL HIGH (ref 0.0–0.2)

## 2024-07-20 LAB — PHOSPHORUS: Phosphorus: 2.3 mg/dL — ABNORMAL LOW (ref 2.5–4.6)

## 2024-07-20 MED ORDER — ENSURE PLUS HIGH PROTEIN PO LIQD
237.0000 mL | Freq: Two times a day (BID) | ORAL | Status: DC
  Administered 2024-07-21 – 2024-08-02 (×6): 237 mL via ORAL

## 2024-07-20 MED ORDER — POTASSIUM PHOSPHATES 15 MMOLE/5ML IV SOLN
15.0000 mmol | Freq: Once | INTRAVENOUS | Status: AC
  Administered 2024-07-20: 15 mmol via INTRAVENOUS
  Filled 2024-07-20: qty 5

## 2024-07-20 MED ORDER — THIAMINE HCL 100 MG PO TABS
100.0000 mg | ORAL_TABLET | Freq: Every day | ORAL | Status: AC
  Administered 2024-07-21 – 2024-07-24 (×2): 100 mg via ORAL
  Filled 2024-07-20 (×11): qty 1

## 2024-07-20 MED ORDER — LACTATED RINGERS IV BOLUS
500.0000 mL | Freq: Once | INTRAVENOUS | Status: AC
  Administered 2024-07-20: 500 mL via INTRAVENOUS

## 2024-07-20 MED ORDER — ADULT MULTIVITAMIN W/MINERALS CH
1.0000 | ORAL_TABLET | Freq: Every day | ORAL | Status: DC
  Administered 2024-07-21 – 2024-08-01 (×4): 1 via ORAL
  Filled 2024-07-20 (×9): qty 1

## 2024-07-20 MED ORDER — POTASSIUM CHLORIDE 10 MEQ/100ML IV SOLN
10.0000 meq | INTRAVENOUS | Status: AC
  Administered 2024-07-20 (×4): 10 meq via INTRAVENOUS
  Filled 2024-07-20 (×4): qty 100

## 2024-07-20 MED ORDER — VITAMIN C 500 MG PO TABS
250.0000 mg | ORAL_TABLET | Freq: Two times a day (BID) | ORAL | Status: DC
  Administered 2024-07-20 – 2024-08-01 (×7): 250 mg via ORAL
  Filled 2024-07-20 (×17): qty 1

## 2024-07-20 MED ORDER — MIDODRINE HCL 5 MG PO TABS
10.0000 mg | ORAL_TABLET | Freq: Three times a day (TID) | ORAL | Status: DC
  Administered 2024-07-20 – 2024-08-03 (×21): 10 mg via ORAL
  Filled 2024-07-20 (×29): qty 2

## 2024-07-20 MED ORDER — MIDODRINE HCL 5 MG PO TABS: 5.0000 mg | ORAL_TABLET | Freq: Three times a day (TID) | ORAL | Status: DC

## 2024-07-20 MED ORDER — LACTATED RINGERS IV BOLUS: 250.0000 mL | Freq: Once | INTRAVENOUS | Status: DC

## 2024-07-20 NOTE — Plan of Care (Signed)
  Problem: Clinical Measurements: Goal: Will remain free from infection Outcome: Progressing Goal: Diagnostic test results will improve Outcome: Progressing Goal: Respiratory complications will improve Outcome: Progressing   Problem: Coping: Goal: Level of anxiety will decrease Outcome: Progressing   Problem: Elimination: Goal: Will not experience complications related to bowel motility Outcome: Progressing   Problem: Education: Goal: Knowledge of General Education information will improve Description: Including pain rating scale, medication(s)/side effects and non-pharmacologic comfort measures Outcome: Not Progressing   Problem: Health Behavior/Discharge Planning: Goal: Ability to manage health-related needs will improve Outcome: Not Progressing   Problem: Nutrition: Goal: Adequate nutrition will be maintained Outcome: Not Progressing

## 2024-07-20 NOTE — Progress Notes (Addendum)
 [facility] rm ICU 6 [facility] Liaison Note   Ms. Kieryn Burtis is a current hospice patient we follow at home for terminal diagnosis of CVD. Family called EMS with complaint of increased AMS. Patient was admitted to [facility] on 7.26.25 with Dx of sepsis with septic shock related to UTI. Per Dr. Norleen Laurence, this is a related hospital admission.   Ms. Alvizo is awake and alert during visit today. She is not conversant or answering questions but does ask for water and is wrestling with her bedding a bit. Exchanged report with bedside nurse who reports patient has been alert most of morning. Let her know patient was asking for water, she relayed she would follow up. Reached out to patient's son by phone but he was busy and reported he would call back later.   She is inpatient appropriate with need for skilled level intervention, IV fluids and IV antibiotics.  VS: 96.1/81/19   130/67    spO2 100% room air  I/O: 874/50  Abnormal labs: Na+ 147, K+ 3, BUN 40, Creatinine 1.14, Ca+ 8, Phos 2.3, Mag 2.7, GFR 45, WBC 11.1  Diagnostics: none new  IV PRN Meds: Ofirmev  1g IV q6H, Rocephin  1g q24H, LR 500ml once, KCL 10mEq x4 IV  Hospital plan per progress notes Dr. Darrin Assaker Septic Shock secondary to (Resolved) #UTI #Toxic Metabolic Encephalopathy  #Dementia #AKI on CKD baseline Cr. 0.88 to 1.0 mg/dl #AGMA, lactic acidosis #Sacral Ulcer #Failure to thrive    Neuro - avoid psychotropic medicines, she is awake but disoriented CV - septic shock secondary to UTI. Resuscitated with IV fluids, currently off nor-epi, MAP > 65 mmHg.  Pulm - on room air, no active issues. History of COPD on nebulizers GI - NPO for now. Speech eval again today.  Renal - AKI improving. Avoid nephrotox agents.  Hem/Onc - on heparin  subQ for DVT prophylaxis ID - Ceftriaxone  for UTI, recent Klebsiella in urine culture in June. Complete 7 days course.    GOC: Discussed goals with her son yesterday  and that cardiac resuitation and intubation would not be appropriate given frailty and very low probability of survival. He needed to discuss that with his family and get back to us .   Goals of care: Ongoing, son consulting with family  Family communication: Spoke briefly with son by phone  IDT: Updated  Discharge planning: Ongoing likely once medically optimized  Please don't hesitate to reach out for any hospice related questions or concerns Hunter Seip, BSN, [facility] liaison [phone]

## 2024-07-20 NOTE — Progress Notes (Signed)
 Nutrition Follow-up  DOCUMENTATION CODES:   Severe malnutrition in context of chronic illness, Underweight  INTERVENTION:   Ensure Plus High Protein po BID, each supplement provides 350 kcal and 20 grams of protein  Magic cup TID with meals, each supplement provides 290 kcal and 9 grams of protein  MVI po daily   Vitamin C  250mg  po BID   Thiamine  100mg  po daily x 7 days   Pt at high refeed risk; recommend monitor potassium, magnesium  and phosphorus labs daily until stable  Daily weights   NUTRITION DIAGNOSIS:   Severe Malnutrition related to chronic illness (COPD and dementia) as evidenced by percent weight loss, severe fat depletion, severe muscle depletion. -ongoing   GOAL:   Patient will meet greater than or equal to 90% of their needs -not met   MONITOR:   PO intake, Supplement acceptance, Labs, Weight trends, Skin, I & O's   ASSESSMENT:   89 y/o female with h/o CKD III, COPD, HTN, anxiety, dementia, MI and chronic wounds who is admitted with UTI, AMS, sepsis and AKI.  Visited pt's room today. Pt sitting up in bed and asking for food. Pt seen by SLP today and initiated on a pureed diet. RD will add supplements and vitamins to help pt meet her estimated needs and support wound healing. Pt is actively refeeding; electrolytes being monitored and supplemented as needed. RD will add thiamine . Per chart, pt is up ~3lbs since admission. Pt +1.7L on her I & Os.   Medications reviewed and include: heparin , midodrine , ceftriaxone , Kphos  Labs reviewed: Na 147(H), K 3.0(L), BUN 40(H), creat 1.14(H), P 2.3(L), Mg 2.7(H) Wbc- 11.1(H) Cbgs- 79, 84 x 24 hrs   UOP- 50ml   Diet Order:   Diet Order             DIET - DYS 1 Room service appropriate? Yes with Assist; Fluid consistency: Thin  Diet effective now                  EDUCATION NEEDS:   Not appropriate for education at this time  Skin:  Skin Assessment: Reviewed RN Assessment (DTIs L & R heels, Stage II  buttocks)  Last BM:  7/29- TYPE 6  Height:   Ht Readings from Last 1 Encounters:  07/20/24 5' 9 (1.753 m)    Weight:   Wt Readings from Last 1 Encounters:  07/20/24 50.4 kg    Ideal Body Weight:  65.9 kg  BMI:  Body mass index is 16.41 kg/m.  Estimated Nutritional Needs:   Kcal:  1400-1600kcal/day  Protein:  70-80g/day  Fluid:  1.3-1.5L/day  Augustin Shams MS, RD, LDN If unable to be reached, please send secure chat to RD inpatient available from 8:00a-4:00p daily

## 2024-07-20 NOTE — Plan of Care (Signed)
  Problem: Clinical Measurements: Goal: Ability to maintain clinical measurements within normal limits will improve Outcome: Progressing Goal: Diagnostic test results will improve Outcome: Progressing Goal: Respiratory complications will improve Outcome: Progressing   Problem: Coping: Goal: Level of anxiety will decrease Outcome: Progressing   Problem: Elimination: Goal: Will not experience complications related to bowel motility Outcome: Progressing   Problem: Pain Managment: Goal: General experience of comfort will improve and/or be controlled Outcome: Progressing   Problem: Safety: Goal: Ability to remain free from injury will improve Outcome: Progressing   Problem: Education: Goal: Knowledge of General Education information will improve Description: Including pain rating scale, medication(s)/side effects and non-pharmacologic comfort measures Outcome: Not Progressing   Problem: Health Behavior/Discharge Planning: Goal: Ability to manage health-related needs will improve Outcome: Not Progressing   Problem: Nutrition: Goal: Adequate nutrition will be maintained Outcome: Not Progressing

## 2024-07-20 NOTE — Progress Notes (Signed)
 NAME:  Molly Parks, MRN:  [id number], DOB:  May 18, 1928, LOS: 3 ADMISSION DATE:  07/17/2024 History of Present Illness:  89 y.o female with significant PMH of dementia, HTN, COPD, anxiety, sacral ulcer, FTT, CKD stage III, CVA and recurrent Klebsiella UTI who presented to the ED with chief complaints of altered mental status   Per ED and patient's son report, the patient has experienced poor oral intake over the past several days. The son noted that the patient has been pocketing food and has had difficulty swallowing. Today, the patient appeared more lethargic and less responsive than usual. At [place], the patient is able to walk with a cane and perform ADLs with minimal assistance. The son mentioned that the patient typically acts like this when she has a UTI. During the last hospital visit, she suffered a stroke and was diagnosed with UTI. Since discharge with home hospice care, her functional capability has continued to decline. The son was concerned  today that her worsening mental status and unresponsiveness may be due to another UTI, so he called EMS. Upon EMS arrival, the patient was initially unresponsive, with a blood pressure reading of 52/30, which improved to 85/58 after administering 1 liter of fluids. The patient was started on a Levophed  drip, initiated at 4 micrograms and titrated up to 10, and was then transported to the ED.   ED Course: Initial vital signs showed Blood pressure (!) 53/37, pulse 96, temperature 98.7 F (37.1 C), temperature source Rectal, resp. rate 20, SpO2 100%. Pertinent Labs/Diagnostics Findings: Na+/ K+: 150/4.2 Glucose:158 BUN/Cr.:44/1.42 WBC:13.2K/L Plts:124 Lactic acid: 3.8 troponin: 142 CXR> CTH> CTA Chest> CT Abd/pelvis>neg Medication administered in the ZI:Ejupzwu given 30 cc/kg of fluids and started on broad-spectrum antibiotics Vanco cefepime  and Flagyl  for sepsis with septic shock. Patient remained hypotensive despite IVF boluses therefore was  started on Levophed . PCCM consulted.  Pertinent  Medical History  Dementia, HTN, COPD, anxiety, sacral ulcer, FTT, CKD stage III, CVA and recurrent Klebsiella UTI   Significant Hospital Events: Including procedures, antibiotic start and stop dates in addition to other pertinent events   7/26: admitted with septic shock 7/27: weaned off vasopressor support 7/28: Ms. Mcghee remains off pressors.   Interim History / Subjective:  Frail, more awake today. Asking for food but concern for aspiration is high.   Objective    Blood pressure 104/68, pulse 87, temperature (!) 96.1 F (35.6 C), temperature source Oral, resp. rate (!) 29, height 6' (1.829 m), weight 50.4 kg, SpO2 100%.        Intake/Output Summary (Last 24 hours) at 07/20/2024 1156 Last data filed at 07/20/2024 1102 Gross per 24 hour  Intake 988.97 ml  Output 50 ml  Net 938.97 ml   Filed Weights   07/18/24 1155 07/19/24 0411 07/20/24 0500  Weight: 48.4 kg 49 kg 50.4 kg    Examination: General: NAD HENT: Supple neck, reactive pupils, EOMI Lungs: Clear bilateral air entry Cardiovascular: Normal S1, Normal S2, RRR Abdomen: Soft, non tender, non distended, +BS Extremities: Warm and well perfused, no edema  Labs and imaging were reviewed.   Assessment and Plan  89 year old female with history of recurrent UTI presenting with septic shock secondary to UTI.   #Septic Shock secondary to (Resolved) #UTI #Toxic Metabolic Encephalopathy  #Dementia #AKI on CKD baseline Cr. 0.88 to 1.0 mg/dl #AGMA, lactic acidosis #Sacral Ulcer #Failure to thrive    Neuro - avoid psychotropic medicines, she is awake but disoriented CV - septic shock secondary  to UTI. Resuscitated with IV fluids, currently off nor-epi, MAP > 65 mmHg.  Pulm - on room air, no active issues. History of COPD on nebulizers GI - NPO for now. Speech eval again today.  Renal - AKI improving. Avoid nephrotox agents.  Hem/Onc - on heparin  subQ for DVT  prophylaxis ID - Ceftriaxone  for UTI, recent Klebsiella in urine culture in June. Complete 7 days course.   GOC: Discussed goals with her son yesterday and that cardiac resuitation and intubation would not be appropriate given frailty and very low probability of survival. He needed to discuss that with his family and get back to us .   Transferred placed for [facility] PCU.   Best Practice (right click and Reselect all SmartList Selections daily)   Diet/type: NPO DVT prophylaxis prophylactic heparin   Pressure ulcer(s): present on admission  GI prophylaxis: N/A Lines: N/A Foley:  N/A Code Status:  full code  Last date of multidisciplinary goals of care discussion [07/20/2024]  Critical care time: 35 minutes    Darrin Barn, MD [facility] 07/20/2024 11:56 AM

## 2024-07-20 NOTE — Progress Notes (Signed)
 PHARMACY CONSULT NOTE  Pharmacy Consult for Electrolyte Monitoring and Replacement   Recent Labs: Potassium (mmol/L)  Date Value  07/20/2024 3.0 (L)  11/14/2012 3.7   Magnesium  (mg/dL)  Date Value  92/70/7974 2.7 (H)   Calcium  (mg/dL)  Date Value  92/70/7974 8.0 (L)   Calcium , Total (mg/dL)  Date Value  88/76/7986 9.2   Albumin (g/dL)  Date Value  92/73/7974 2.6 (L)  11/14/2012 4.1   Phosphorus (mg/dL)  Date Value  92/70/7974 2.3 (L)   Sodium (mmol/L)  Date Value  07/20/2024 147 (H)  11/14/2012 136   Assessment: 89 y.o female with significant PMH of dementia, HTN, COPD, anxiety, sacral ulcer, FTT, CKD stage III, CVA and recurrent Klebsiella UTI who presented to the ED with chief complaints of altered mental status. Pharmacy is asked to follow and replace electrolytes while in CCU.  Patient is currently NPO  Goal of Therapy:  Electrolytes WNL  Plan:  --Na 147, 500 cc fluid bolus ordered --K 3, Kcl 10 mEq IV q1h x 4 doses --Phos 2.3, potassium phosphate  15 mmol IV x 1 --Re-check electrolytes in AM  Saretta Dahlem B Havard Radigan 07/20/2024 2:00 PM

## 2024-07-20 NOTE — Progress Notes (Addendum)
 Speech Language Pathology Treatment: Dysphagia  Patient Details Name: Molly Parks MRN: [id number] DOB: Feb 19, 1928 Today's Date: 07/20/2024 Time: 9149-9054 SLP Time Calculation (min) (ACUTE ONLY): 55 min  Assessment / Plan / Recommendation Clinical Impression  Pt seen for ongoing assessment of swallowing and toleration of po's this AM. Pt awake, eyes open and looking around; distracted easily. Pt is HOH. She read this SLP's namebadge appropriately; few words given. Pt has Baseline Dementia w/ FTT and Severe Malnutrition per chart. Pt is of Advanced Age and requires support w/ ADLs in the home. She indicated her wishes for food, drink.  On RA, afebrile. WBC 11.1. Pt is Cachectic appearing w/ dx'd FTT; Severe Malnutrition- BMI 14.65 per chart.   Pt appears to present w/ oral phase Dysphagia in setting of declined Cognitive status, Baseline Dementia, as well as other medical comorbidities. Family has reported that pt has had decreased oral intake; pt has dx'd FTT, Severe Malnutrition also per chart. ANY Cognitive decline can impact overall awareness/timing of swallowing thus safety during po tasks which increases risk for aspiration/aspiration pneumonia. Pt has Multiple comorbidities also impacting safety w/ oral intake(see chart).          Pt required MAX assist in sitting upright in bed for po intake. She required FULL FEEDING assistance w/ the TSP/Cup trials given. Pt consumed trials of Single ice chips, purees, thin liquids via Cup/TSP w/ No overt s/s of aspiration- pt swallowed each trial given this session(checking of oral cavity b/t boluses to ensure swallow and clearing). No expectoration/leakage occurred w/ trials. No decline in vocal quality, no cough, and no decline in respiratory status during/post trials occurred. Min reduced laryngeal excursion/movement during pharyngeal swallowing but WFL. MODERATE+ oral phase deficits were noted c/b min reduced awareness of bolus, reduced lingual  movement/coordination, prolonged oral transit, and oral holding material/residue. Pt required full feeding assistance. Slightly improved A-P transfer and swallow/clearing was noted at today's session- had pt open mouth/stick out tongue to show oral clearing b/t boluses. Oral holding and A-P delay was ~10-20 secs w/ each bolus.     In setting of presentation of Oral phase deficits and Cognitive decline, recommend Dysphagia level 1 (puree) w/ Thin liquids via Cup/TSP; general aspiration precautions. Engage pt in Holding Cup to drink. NO Straws. Must sit fully upright for oral intake. Oral care post po's also.  ST services will f/u to monitor toleration of po's and diet; education as needed. MD/NSG/Team updated.  ST services recommends follow w/ Hospice services (pt is ongoing w/ their services) in order to discuss GOC in setting of her FTT, Severe Malnutrition, Advanced Age, and Dementia. Education/information is recommended for the Family re: the impact of Cognitive decline/Dementia on swallowing and being able to meet her nutrition/hydration needs orally. Recommend f/u w/ Dietician for further. Precautions posted in room, chart.     HPI HPI: Pt is a 89yo female w/ a PMH of Dementia, Severe Malnutrition, HTN, COPD, anxiety, sacral ulcers, FTT,  HOH, CVD, CKD stage III, CVA and recurrent Klebsiella UTI who presented to the ED with chief complaints of altered mental status.  Per ED and patient's son report, the patient has had poor oral intake.  The son noted that the patient has been pocketing food and has had difficulty swallowing.  Today, the patient appeared more lethargic and less responsive than usual. Per report, the patient is able to walk with a cane and perform ADLs with minimal assistance.. The son mentioned that the patient typically acts like this  when she has a UTI.Molly Parks    Chest CT: Lungs/Pleura: Lungs are well aerated bilaterally. 6 mm nodule is  noted in the posterior aspect of the right upper  lobe. This occurs  in an area of previous ground-glass opacity. Few scattered smaller  less than 4 mm nodules are noted throughout both lungs. Bibasilar  atelectasis is noted.  Head CT: Brain: No evidence of acute infarction, hemorrhage, hydrocephalus,  extra-axial collection or mass lesion/mass effect. Chronic, Moderately Advanced Atrophic changes and white matter ischemic changes are again seen. Findings  of prior infarct in the right occipital lobe, right cerebellum and  high near the vertex in the right parietal lobe. These are stable in  appearance from the prior exam.      SLP Plan  Continue with current plan of care          Recommendations  Diet recommendations: Dysphagia 1 (puree);Thin liquid Liquids provided via: Cup;Teaspoon Medication Administration: Crushed with puree Supervision: Patient able to self feed;Staff to assist with self feeding;Full supervision/cueing for compensatory strategies Compensations: Minimize environmental distractions;Slow rate;Small sips/bites;Lingual sweep for clearance of pocketing;Follow solids with liquid Postural Changes and/or Swallow Maneuvers: Out of bed for meals;Seated upright 90 degrees;Upright 30-60 min after meal                  Oral care BID;Oral care before and after PO;Staff/trained caregiver to provide oral care   Frequent or constant Supervision/Assistance Dysphagia, oral phase (R13.11) (Dementia; advanced age; FTT w/ severe Malnutrition)     Continue with current plan of care       Molly Portugal, MS, CCC-SLP Speech Language Pathologist Rehab Services; [facility] [phone] (ascom) Molly Parks  07/20/2024, 3:23 PM

## 2024-07-21 DIAGNOSIS — R6520 Severe sepsis without septic shock: Secondary | ICD-10-CM | POA: Diagnosis not present

## 2024-07-21 DIAGNOSIS — A419 Sepsis, unspecified organism: Secondary | ICD-10-CM

## 2024-07-21 LAB — CBC
HCT: 37.1 % (ref 36.0–46.0)
Hemoglobin: 11.8 g/dL — ABNORMAL LOW (ref 12.0–15.0)
MCH: 27.2 pg (ref 26.0–34.0)
MCHC: 31.8 g/dL (ref 30.0–36.0)
MCV: 85.5 fL (ref 80.0–100.0)
Platelets: 112 K/uL — ABNORMAL LOW (ref 150–400)
RBC: 4.34 MIL/uL (ref 3.87–5.11)
RDW: 20.7 % — ABNORMAL HIGH (ref 11.5–15.5)
WBC: 14.5 K/uL — ABNORMAL HIGH (ref 4.0–10.5)
nRBC: 1 % — ABNORMAL HIGH (ref 0.0–0.2)

## 2024-07-21 LAB — BASIC METABOLIC PANEL WITH GFR
Anion gap: 14 (ref 5–15)
BUN: 40 mg/dL — ABNORMAL HIGH (ref 8–23)
CO2: 19 mmol/L — ABNORMAL LOW (ref 22–32)
Calcium: 8 mg/dL — ABNORMAL LOW (ref 8.9–10.3)
Chloride: 112 mmol/L — ABNORMAL HIGH (ref 98–111)
Creatinine, Ser: 1.28 mg/dL — ABNORMAL HIGH (ref 0.44–1.00)
GFR, Estimated: 39 mL/min — ABNORMAL LOW (ref >60)
Glucose, Bld: 82 mg/dL (ref 70–99)
Potassium: 4.3 mmol/L (ref 3.5–5.1)
Sodium: 145 mmol/L (ref 135–145)

## 2024-07-21 LAB — GLUCOSE, CAPILLARY
Glucose-Capillary: 77 mg/dL (ref 70–99)
Glucose-Capillary: 83 mg/dL (ref 70–99)
Glucose-Capillary: 68 mg/dL — ABNORMAL LOW (ref 70–99)
Glucose-Capillary: 155 mg/dL — ABNORMAL HIGH (ref 70–99)
Glucose-Capillary: 112 mg/dL — ABNORMAL HIGH (ref 70–99)
Glucose-Capillary: 87 mg/dL (ref 70–99)

## 2024-07-21 LAB — PHOSPHORUS: Phosphorus: 4.1 mg/dL (ref 2.5–4.6)

## 2024-07-21 LAB — MAGNESIUM: Magnesium: 2.4 mg/dL (ref 1.7–2.4)

## 2024-07-21 MED ORDER — DEXTROSE 50 % IV SOLN
12.5000 g | Freq: Once | INTRAVENOUS | Status: AC
  Administered 2024-07-21: 12.5 g via INTRAVENOUS
  Filled 2024-07-21: qty 50

## 2024-07-21 MED ORDER — ORAL CARE MOUTH RINSE: 15.0000 mL | OROMUCOSAL | Status: DC | PRN

## 2024-07-21 MED ORDER — ORAL CARE MOUTH RINSE
15.0000 mL | OROMUCOSAL | Status: DC
  Administered 2024-07-23 – 2024-08-01 (×21): 15 mL via OROMUCOSAL

## 2024-07-21 NOTE — Progress Notes (Addendum)
 [facility] rm ICU 6 [facility] Liaison Note   Molly. Molly Parks is a current hospice patient we follow at home for terminal diagnosis of CVD. Family called EMS with complaint of increased AMS. Patient was admitted to [facility] on 7.26.25 with Dx of sepsis with septic shock related to UTI. Per Dr. Norleen Laurence, this is a related hospital admission.    Molly Parks was being transferred out of ICU to room 121 during HL visit.  Patient awoke, briefly, when HL said her name.  Attempted to contact son, but he did not answer and his voicemail was full.     She is inpatient appropriate with need for skilled level intervention, IV fluids and IV antibiotics.   VS: 96.1, 100/55, 68, 18,  spO2 100% room air   I/O: 1044.5/200   Abnormal labs: Chloride 112, Co2 19, BUN 40, Creatinine 1.28, Calcium  8.0, GFR 39, WBC 14.5, Hemoglobin 11.8, RDW 20.7, Platelets 112.     Diagnostics: none new   IV PRN Meds:, Rocephin  1g q24H,  KCL 10mEq x4 IV, Potassium phosphate : Dose , rate 76ml/hr. once IV.     Hospital plan per progress notes Dr. Deliliah Rashid-7.30.25  Principal Problem:   Severe sepsis with acute organ dysfunction [facility]) Active Problems:   Protein-calorie malnutrition, severe   89 y.o female with significant PMH of dementia, HTN, COPD, anxiety, sacral ulcer, FTT, CKD stage III, CVA and recurrent Klebsiella UTI who presented to the ED with chief complaints of altered mental status.    Septic shock in the setting of UTI,POA: Resolved now Off of pressors Continue to monitor BP closely. Goal Map of 65 and above Finished 7-day course of ceftriaxone    Acute metabolic encephalopathy,POA: in the setting of UTI and advanced dementia. Mental status appears to be at baseline now.   Sacral decubitus ulcer: Continue wound care as per protocol.   CKD stage 3: No acute issues now   Moderate protein calorie malnutrition: As evidenced by low albumin in the setting of low calorie intake.  Dietician on board.   COPD: continue with prn inhalational bronchodilator therapy   DVT prophylaxis: heparin  injection 5,000 Units Start: 07/18/24 0200       Code Status: Full Code  Goals of care: Ongoing, son consulting with family   Family communication: Son did not answer phone and voicemail was full so a message could not be left.     IDT: Updated   Discharge planning: Ongoing likely once medically optimized   Please don't hesitate to reach out for any hospice related questions or concerns  [facility] Liaison [phone]

## 2024-07-21 NOTE — Progress Notes (Signed)
 Transferred to room 121 AA by bed with this RN on tele. Daphne armin Stabs RNs at [facility] with patient's son in new room.

## 2024-07-21 NOTE — Progress Notes (Signed)
 PROGRESS NOTE    Molly Parks  FMW:[id number] DOB: 09/09/1928 DOA: 07/17/2024 PCP: Eliverto Bette Hover, MD   Brief Narrative:   89 y.o female with significant PMH of dementia, HTN, COPD, anxiety, sacral ulcer, FTT, CKD stage III, CVA and recurrent Klebsiella UTI who presented to the ED with chief complaints of altered mental status. Upon EMS arrival, the patient was initially unresponsive, with a blood pressure reading of 52/30, which improved to 85/58 after administering 1 liter of fluids. The patient was started on a Levophed  drip. Septic shock has resolved now. Transferred to [facility] Service as of 7/30. She was on hospice at home.     Assessment & Plan:  Principal Problem:   Severe sepsis with acute organ dysfunction (HCC) Active Problems:   Protein-calorie malnutrition, severe   89 y.o female with significant PMH of dementia, HTN, COPD, anxiety, sacral ulcer, FTT, CKD stage III, CVA and recurrent Klebsiella UTI who presented to the ED with chief complaints of altered mental status.   Septic shock in the setting of UTI,POA: Resolved now Off of pressors Continue to monitor BP closely. Goal Map of 65 and above Finished 7-day course of ceftriaxone   Acute metabolic encephalopathy,POA: in the setting of UTI and advanced dementia. Mental status appears to be at baseline now.  Sacral decubitus ulcer: Continue wound care as per protocol.  CKD stage 3: No acute issues now  Moderate protein calorie malnutrition: As evidenced by low albumin in the setting of low calorie intake. Dietician on board.  COPD: continue with prn inhalational bronchodilator therapy  DVT prophylaxis: heparin  injection 5,000 Units Start: 07/18/24 0200     Code Status: Full Code Family Communication:   Status is: Inpatient Remains inpatient appropriate because: sepsis,hypotension    Subjective:  Not conversant, didn't answer any of my questions. Appears awake. Not in distress.    Examination:  General  exam: Appears alert and awake but non-conversant Respiratory system: Clear to auscultation. Respiratory effort normal. Cardiovascular system: S1 & S2 heard, RRR. No JVD, murmurs, rubs, gallops or clicks. No pedal edema. Gastrointestinal system: Abdomen is nondistended, soft and nontender. No organomegaly or masses felt. Normal bowel sounds heard. Central nervous system: Alert and oriented. No focal neurological deficits. Extremities: Unable to assess power and sensory systems since she is not following commands      Diet Orders (From admission, onward)     Start     Ordered   07/20/24 1158  DIET - DYS 1 Room service appropriate? Yes with Assist; Fluid consistency: Thin  Diet effective now       Comments: Extra Gravy on meats, potatoes.  Yogurt TID meals.  Likes puddings, ice cream, cream soups, and apple juice.  NO STRAWS!!  Question Answer Comment  Room service appropriate? Yes with Assist   Fluid consistency: Thin      07/20/24 1204            Objective: Vitals:   07/21/24 0800 07/21/24 0900 07/21/24 1000 07/21/24 1100  BP: (!) 103/55 (!) 107/56 (!) 100/55 103/62  Pulse: 68     Resp: (!) 21 (!) 21 18 18   Temp:      TempSrc:      SpO2:      Weight:      Height:        Intake/Output Summary (Last 24 hours) at 07/21/2024 1206 Last data filed at 07/21/2024 1000 Gross per 24 hour  Intake 271 ml  Output 200 ml  Net 71 ml  Filed Weights   07/19/24 0411 07/20/24 0500 07/21/24 0500  Weight: 49 kg 50.4 kg 53.5 kg    Scheduled Meds:  vitamin C   250 mg Oral BID   Chlorhexidine  Gluconate Cloth  6 each Topical Daily   dextrose   12.5 g Intravenous Once   feeding supplement  237 mL Oral BID BM   heparin   5,000 Units Subcutaneous Q8H   midodrine   10 mg Oral TID WC   multivitamin with minerals  1 tablet Oral Daily   mouth rinse  15 mL Mouth Rinse 4 times per day   thiamine   100 mg Oral Daily   Continuous Infusions:  cefTRIAXone  (ROCEPHIN )  IV 1 g (07/21/24 1101)     Nutritional status Signs/Symptoms: percent weight loss, severe fat depletion, severe muscle depletion Interventions: Refer to RD note for recommendations Body mass index is 17.42 kg/m.  Data Reviewed:   CBC: Recent Labs  Lab 07/17/24 2119 07/18/24 0337 07/19/24 0525 07/20/24 0320 07/21/24 0525  WBC 13.2* 15.2* 12.9* 11.1* 14.5*  NEUTROABS 10.3*  --   --   --   --   HGB 12.7 13.0 12.3 12.5 11.8*  HCT 42.0 41.3 37.4 37.4 37.1  MCV 89.7 86.4 83.1 81.3 85.5  PLT 124* 114* 92* 106* 112*   Basic Metabolic Panel: Recent Labs  Lab 07/17/24 2119 07/18/24 0337 07/19/24 0525 07/20/24 0320 07/21/24 0339  NA 150* 147* 148* 147* 145  K 4.2 3.7 3.6 3.0* 4.3  CL 118* 115* 113* 113* 112*  CO2 16* 18* 22 23 19*  GLUCOSE 158* 236* 113* 97 82  BUN 44* 44* 41* 40* 40*  CREATININE 1.42* 1.23* 1.35* 1.14* 1.28*  CALCIUM  8.4* 8.2* 8.0* 8.0* 8.0*  MG  --  2.0 1.8 2.7* 2.4  PHOS  --  2.5  --  2.3* 4.1   GFR: Estimated Creatinine Clearance: 22.7 mL/min (A) (by C-G formula based on SCr of 1.28 mg/dL (H)). Liver Function Tests: Recent Labs  Lab 07/17/24 2119  AST 22  ALT 7  ALKPHOS 35*  BILITOT 1.1  PROT 5.3*  ALBUMIN 2.6*   No results for input(s): LIPASE, AMYLASE in the last 168 hours. No results for input(s): AMMONIA in the last 168 hours. Coagulation Profile: Recent Labs  Lab 07/18/24 0337  INR 1.2   Cardiac Enzymes: No results for input(s): CKTOTAL, CKMB, CKMBINDEX, TROPONINI in the last 168 hours. BNP (last 3 results) No results for input(s): PROBNP in the last 8760 hours. HbA1C: No results for input(s): HGBA1C in the last 72 hours. CBG: Recent Labs  Lab 07/20/24 1932 07/20/24 2329 07/21/24 0335 07/21/24 0724 07/21/24 1133  GLUCAP 84 86 77 83 68*   Lipid Profile: No results for input(s): CHOL, HDL, LDLCALC, TRIG, CHOLHDL, LDLDIRECT in the last 72 hours. Thyroid Function Tests: No results for input(s): TSH, T4TOTAL,  FREET4, T3FREE, THYROIDAB in the last 72 hours. Anemia Panel: No results for input(s): VITAMINB12, FOLATE, FERRITIN, TIBC, IRON, RETICCTPCT in the last 72 hours. Sepsis Labs: Recent Labs  Lab 07/18/24 0337 07/18/24 1635 07/18/24 2024 07/18/24 2323 07/19/24 0525  PROCALCITON 0.36  --   --   --   --   LATICACIDVEN  --  3.3* 3.0* 3.5* 2.7*    Recent Results (from the past 240 hours)  MRSA Next Gen by PCR, Nasal     Status: None   Collection Time: 07/18/24  3:46 AM   Specimen: Nasal Mucosa; Nasal Swab  Result Value Ref Range Status   MRSA by  PCR Next Gen NOT DETECTED NOT DETECTED Final    Comment: (NOTE) The GeneXpert MRSA Assay (FDA approved for NASAL specimens only), is one component of a comprehensive MRSA colonization surveillance program. It is not intended to diagnose MRSA infection nor to guide or monitor treatment for MRSA infections. Test performance is not FDA approved in patients less than 18 years old. Performed at [facility], [address] [place], [place] [address]   Urine Culture     Status: Abnormal   Collection Time: 07/18/24  3:30 PM   Specimen: Urine, Random  Result Value Ref Range Status   Specimen Description   Final    URINE, RANDOM Performed at [facility], [address] [place], [place] [address]    Special Requests   Final    NONE Reflexed from [phone] Performed at [facility], [address] [place], [place] [address]    Culture >=100,000 COLONIES/mL YEAST (A)  Final   Report Status 07/20/2024 FINAL  Final  Blood Culture (routine x 2)     Status: None (Preliminary result)   Collection Time: 07/18/24  4:35 PM   Specimen: BLOOD  Result Value Ref Range Status   Specimen Description BLOOD BLOOD LEFT ARM  Final   Special Requests   Final    BOTTLES DRAWN AEROBIC AND ANAEROBIC Blood Culture results may not be optimal due to an inadequate volume of blood received in culture bottles   Culture    Final    NO GROWTH 3 DAYS Performed at [facility], [address], [place], [place] [address]    Report Status PENDING  Incomplete  Blood Culture (routine x 2)     Status: None (Preliminary result)   Collection Time: 07/18/24  4:54 PM   Specimen: BLOOD LEFT HAND  Result Value Ref Range Status   Specimen Description BLOOD LEFT HAND  Final   Special Requests   Final    BOTTLES DRAWN AEROBIC AND ANAEROBIC Blood Culture adequate volume   Culture   Final    NO GROWTH 3 DAYS Performed at [facility], [address] [place], [place] [address]    Report Status PENDING  Incomplete         Radiology Studies: No results found.         LOS: 4 days   Time spent= 41 mins    Deliliah Room, MD [facility]  If 7PM-7AM, please contact night-coverage  07/21/2024, 12:06 PM

## 2024-07-21 NOTE — Progress Notes (Signed)
 CBG 68, therefore half amp d50 given per protocol. CBG rechecked and is 155. Notified Dr. Dino that patient is refusing meds and spitting them out.

## 2024-07-21 NOTE — Progress Notes (Signed)
 Speech Language Pathology Treatment: Dysphagia  Patient Details Name: Molly Parks MRN: [id number] DOB: 06/18/1928 Today's Date: 07/21/2024 Time: 8864-8789 SLP Time Calculation (min) (ACUTE ONLY): 35 min  Assessment / Plan / Recommendation Clinical Impression  Pt seen for ongoing assessment of swallowing and toleration of po's this AM. Pt awake, eyes open; distracted easily. Pt is HOH. Few words spoken. Pt has Baseline Dementia w/ FTT and Severe Malnutrition per chart. Pt is of Advanced Age(89y) and requires support w/ ADLs in the home. She is on Hospice services at home.  On RA, afebrile. WBC elevated. Pt is Cachectic appearing w/ dx'd FTT; Severe Malnutrition- BMI 14.65 at admit per chart.   Pt appears to present w/ oral phase Dysphagia in setting of declined Cognitive status, Baseline Dementia, as well as other medical comorbidities. Family has reported that pt has had decreased oral intake; pt has dx'd FTT, Severe Malnutrition also per chart. ANY Cognitive decline can impact overall awareness/timing of swallowing thus safety during po tasks which increases risk for aspiration/aspiration pneumonia. Pt has Multiple comorbidities also impacting safety w/ oral intake(see chart).          Pt required MAX assist in sitting upright in bed for po intake. She required FULL FEEDING assistance w/ the TSP trials given. Pt consumed few trials of purees, thin liquids via TSP w/ No overt s/s of aspiration- pt swallowed trials this session(checking of oral cavity b/t boluses to ensure swallow and clearing). No decline in vocal quality, no cough, and no decline in respiratory status during/post trials occurred. Min reduced laryngeal excursion/movement during pharyngeal swallowing but WFL. MODERATE+ oral phase deficits were noted c/b min reduced awareness of bolus, reduced lingual movement/coordination, prolonged oral transit, and oral holding material/residue. She expectorated boluses intermittently and put her hand  up to her face when more was attempted. Pt required full feeding assistance.      In setting of presentation of ongoing Oral phase deficits and Cognitive decline, recommend continue the Dysphagia level 1 (puree) w/ Thin liquids via TSP, cup as able to tolerate; aspiration precautions. Engage pt in Holding Cup to drink. NO Straws. Must sit fully upright for oral intake. Oral care post po's also.  ST services can f/u at her next venue of care to monitor toleration of po's and diet; give education along w/ Hospice services to Family to understand the impact of Dementia on swallowing/oral intake. MD/NSG/Team updated. No further acute ST services indicated in setting of pt's inconsistent engagement w/ tasks in the acute setting. ST services recommends follow w/ Hospice services (pt is ongoing w/ their services) in order to discuss GOC in setting of her FTT, Severe Malnutrition, Advanced Age, and Dementia. Education/information is recommended for the Family re: the impact of Cognitive decline/Dementia on swallowing and being able to meet her nutrition/hydration needs orally. Recommend f/u w/ Dietician for further. Precautions posted in room, chart.      HPI HPI: Pt is a 89yo female w/ a PMH of Dementia, Severe Malnutrition, HTN, COPD, anxiety, sacral ulcers, FTT,  HOH, CVD, CKD stage III, CVA and recurrent Klebsiella UTI who presented to the ED with chief complaints of altered mental status.  Per ED and patient's son report, the patient has had poor oral intake.  The son noted that the patient has been pocketing food and has had difficulty swallowing.  Today, the patient appeared more lethargic and less responsive than usual. Per report, the patient is able to walk with a cane and perform ADLs with  minimal assistance.. The son mentioned that the patient typically acts like this when she has a UTI.Molly Parks    Chest CT: Lungs/Pleura: Lungs are well aerated bilaterally. 6 mm nodule is  noted in the posterior aspect of  the right upper lobe. This occurs  in an area of previous ground-glass opacity. Few scattered smaller  less than 4 mm nodules are noted throughout both lungs. Bibasilar  atelectasis is noted.  Head CT: Brain: No evidence of acute infarction, hemorrhage, hydrocephalus,  extra-axial collection or mass lesion/mass effect. Chronic, Moderately Advanced Atrophic changes and white matter ischemic changes are again seen. Findings  of prior infarct in the right occipital lobe, right cerebellum and  high near the vertex in the right parietal lobe. These are stable in  appearance from the prior exam.      SLP Plan  Discharge SLP treatment due to (comment) (not able to advance diet at this time)          Recommendations  Diet recommendations: Dysphagia 1 (puree);Thin liquid Liquids provided via: Teaspoon;Cup;No straw Medication Administration: Crushed with puree Supervision: Patient able to self feed;Staff to assist with self feeding;Full supervision/cueing for compensatory strategies Compensations: Minimize environmental distractions;Slow rate;Small sips/bites;Lingual sweep for clearance of pocketing;Follow solids with liquid Postural Changes and/or Swallow Maneuvers: Out of bed for meals;Seated upright 90 degrees;Upright 30-60 min after meal                 (Palliative Care/Hospice f/u for GOC/needs) Oral care BID;Oral care before and after PO;Staff/trained caregiver to provide oral care   Frequent or constant Supervision/Assistance Dysphagia, oral phase (R13.11) (Dementia; advanced age; FTT w/ severe Malnutrition)     Discharge SLP treatment due to (comment) (not able to advance diet at this time)       Molly Portugal, MS, CCC-SLP Speech Language Pathologist Rehab Services; [name] [phone] (ascom) Molly Parks  07/21/2024, 3:36 PM

## 2024-07-22 DIAGNOSIS — R6520 Severe sepsis without septic shock: Secondary | ICD-10-CM | POA: Diagnosis not present

## 2024-07-22 DIAGNOSIS — A419 Sepsis, unspecified organism: Secondary | ICD-10-CM | POA: Diagnosis not present

## 2024-07-22 LAB — BASIC METABOLIC PANEL WITH GFR
Anion gap: 11 (ref 5–15)
BUN: 46 mg/dL — ABNORMAL HIGH (ref 8–23)
CO2: 20 mmol/L — ABNORMAL LOW (ref 22–32)
Calcium: 8 mg/dL — ABNORMAL LOW (ref 8.9–10.3)
Chloride: 115 mmol/L — ABNORMAL HIGH (ref 98–111)
Creatinine, Ser: 1.4 mg/dL — ABNORMAL HIGH (ref 0.44–1.00)
GFR, Estimated: 35 mL/min — ABNORMAL LOW (ref >60)
Glucose, Bld: 78 mg/dL (ref 70–99)
Potassium: 4.1 mmol/L (ref 3.5–5.1)
Sodium: 146 mmol/L — ABNORMAL HIGH (ref 135–145)

## 2024-07-22 LAB — CBC
HCT: 34.4 % — ABNORMAL LOW (ref 36.0–46.0)
Hemoglobin: 11.4 g/dL — ABNORMAL LOW (ref 12.0–15.0)
MCH: 27.4 pg (ref 26.0–34.0)
MCHC: 33.1 g/dL (ref 30.0–36.0)
MCV: 82.7 fL (ref 80.0–100.0)
Platelets: 106 K/uL — ABNORMAL LOW (ref 150–400)
RBC: 4.16 MIL/uL (ref 3.87–5.11)
RDW: 20.5 % — ABNORMAL HIGH (ref 11.5–15.5)
WBC: 14.4 K/uL — ABNORMAL HIGH (ref 4.0–10.5)
nRBC: 0.7 % — ABNORMAL HIGH (ref 0.0–0.2)

## 2024-07-22 LAB — GLUCOSE, CAPILLARY
Glucose-Capillary: 171 mg/dL — ABNORMAL HIGH (ref 70–99)
Glucose-Capillary: 70 mg/dL (ref 70–99)
Glucose-Capillary: 78 mg/dL (ref 70–99)
Glucose-Capillary: 79 mg/dL (ref 70–99)
Glucose-Capillary: 82 mg/dL (ref 70–99)
Glucose-Capillary: 88 mg/dL (ref 70–99)

## 2024-07-22 LAB — MAGNESIUM: Magnesium: 2.4 mg/dL (ref 1.7–2.4)

## 2024-07-22 LAB — PHOSPHORUS: Phosphorus: 4.1 mg/dL (ref 2.5–4.6)

## 2024-07-22 MED ORDER — DEXTROSE-SODIUM CHLORIDE 5-0.9 % IV SOLN: INTRAVENOUS | Status: AC

## 2024-07-22 MED ORDER — DEXTROSE 50 % IV SOLN
50.0000 mL | Freq: Once | INTRAVENOUS | Status: AC
  Administered 2024-07-22: 50 mL via INTRAVENOUS
  Filled 2024-07-22: qty 50

## 2024-07-22 NOTE — Progress Notes (Signed)
 [facility] rm [facility] Liaison Note   [name] is a current hospice patient we follow at home for terminal diagnosis of CVD. Family called EMS with complaint of increased AMS. Patient was admitted to [facility] on 7.26.25 with Dx of sepsis with septic shock related to UTI. Per Dr. Norleen Parks, this is a related hospital admission.   Molly Parks has been transferred out to the hospital floor and is in Parks [address]. During visit she was sleeping with no apparent symptoms of distress and did not wake to gentle stimulation. No family present during visit. She continues to receive IV interventions due to no oral intake as well as IV antibiotics for treatment of infection.    She is inpatient appropriate with need for IV fluids and IV antibiotics.  VS: 97.6/85/16   122/69    spO2 97% Parks air  I/O: not recorded  Abnormal labs: Na+ 146, BUN 46, Creatinine 1.4, Ca+8, GFR  35, WBC 14.4, Hgb 11.4, Hct 34.4, Platelets 106  Diagnostics: none new  IV PRN Meds: Rocephin  1 gram IV q24 H, refusing PO meds  Hospital plan per progress notes Dr. Deliliah Parks 7.31.25 Septic shock in the setting of UTI,POA: Resolved now Off of pressors Continue to monitor BP closely. Goal Map of 65 and above Finished 7-day course of ceftriaxone    Acute metabolic encephalopathy,POA: in the setting of UTI and advanced dementia. Mental status appears to be at baseline now.   Sacral decubitus ulcer: Continue wound care as per protocol.   CKD stage 3: No acute issues now   Moderate protein calorie malnutrition: As evidenced by low albumin in the setting of low calorie intake. Dietician on board.  Goals of care: Ongoing, remains full code  Family communication: Left voicemail for son  IDT: Updated  Discharge planning: Ongoing likely once medically optimized  Please don't hesitate to reach out for any hospice related questions or concerns Molly Parks, BSN, [facility] liaison [phone]

## 2024-07-22 NOTE — Plan of Care (Signed)
 The patient continues on IV Ceftriaxone  1g for UTI w/o s/s of acute distress noted.

## 2024-07-22 NOTE — Progress Notes (Signed)
 Progress Note   Patient: Molly Parks FMW:[id number] DOB: [birth year] DOA: 07/17/2024     5 DOS: the patient was seen and examined on 07/22/2024    Brief Narrative:    89 y.o female with significant PMH of dementia, HTN, COPD, anxiety, sacral ulcer, FTT, CKD stage III, CVA and recurrent Klebsiella UTI who presented to the ED with chief complaints of altered mental status. Upon EMS arrival, the patient was initially unresponsive, with a blood pressure reading of 52/30, which improved to 85/58 after administering 1 liter of fluids. The patient was started on a Levophed  drip. Septic shock has resolved now. Transferred to [facility] as of 7/30. She was on hospice at home.      Assessment & Plan:  89 y.o female with significant PMH of dementia, HTN, COPD, anxiety, sacral ulcer, FTT, CKD stage III, CVA and recurrent Klebsiella UTI who presented to the ED with chief complaints of altered mental status.    Septic shock in the setting of UTI,POA: Resolved now Off of pressors Continue to monitor BP closely. Goal Map of 65 and above Finished 7-day course of ceftriaxone    Acute metabolic encephalopathy,POA: in the setting of UTI and advanced dementia. Mental status appears to be at baseline now.   Sacral decubitus ulcer: Continue wound care as per protocol.   CKD stage 3: No acute issues now   Moderate protein calorie malnutrition: As evidenced by low albumin in the setting of low calorie intake. Dietician on board.   COPD:  Continue with prn inhalational bronchodilator therapy   DVT prophylaxis: heparin  injection 5,000 Units Start: 07/18/24 0200       Code Status: Full Code  Family Communication: None present at bedside  Status is: Inpatient Disposition: Pending hospice placement       Subjective: Patient seen and examined at bedside this morning Awake but confused Denies nausea vomiting or chest pain    Examination:   General exam: Appears alert and awake but  non-conversant Respiratory system: Clear to auscultation. Respiratory effort normal. Cardiovascular system: S1 & S2 heard, RRR. No JVD, murmurs, rubs, gallops or clicks. No pedal edema. Gastrointestinal system: Abdomen is nondistended, soft and nontender. No organomegaly or masses felt. Normal bowel sounds heard. Central nervous system: Alert and oriented. No focal neurological deficits. Extremities: Unable to assess power and sensory systems since she is not following commands    Data Reviewed:    Latest Ref Rng & Units 07/22/2024    5:38 AM 07/21/2024    5:25 AM 07/20/2024    3:20 AM  CBC  WBC 4.0 - 10.5 K/uL 14.4  14.5  11.1   Hemoglobin 12.0 - 15.0 g/dL 88.5  88.1  87.4   Hematocrit 36.0 - 46.0 % 34.4  37.1  37.4   Platelets 150 - 400 K/uL 106  112  106        Latest Ref Rng & Units 07/22/2024    5:38 AM 07/21/2024    3:39 AM 07/20/2024    3:20 AM  BMP  Glucose 70 - 99 mg/dL 78  82  97   BUN 8 - 23 mg/dL 46  40  40   Creatinine 0.44 - 1.00 mg/dL 8.59  8.71  8.85   Sodium 135 - 145 mmol/L 146  145  147   Potassium 3.5 - 5.1 mmol/L 4.1  4.3  3.0   Chloride 98 - 111 mmol/L 115  112  113   CO2 22 - 32 mmol/L 20  19  23   Calcium  8.9 - 10.3 mg/dL 8.0  8.0  8.0      Vitals:   07/22/24 0504 07/22/24 0751 07/22/24 1242 07/22/24 1542  BP: 139/70 132/82 122/69 124/64  Pulse: 83 97 85 85  Resp: 16 18 16 16   Temp: 97.7 F (36.5 C) (!) 97.5 F (36.4 C) 97.6 F (36.4 C) 97.6 F (36.4 C)  TempSrc:  Oral Oral   SpO2: 100% 99% 97% 100%  Weight:      Height:        Time spent: 42 minutes  Author: Drue ONEIDA Potter, MD 07/22/2024 5:19 PM  For on call review [email or web address].

## 2024-07-23 DIAGNOSIS — R6520 Severe sepsis without septic shock: Secondary | ICD-10-CM | POA: Diagnosis not present

## 2024-07-23 DIAGNOSIS — A419 Sepsis, unspecified organism: Secondary | ICD-10-CM

## 2024-07-23 LAB — BASIC METABOLIC PANEL WITH GFR
Anion gap: 9 (ref 5–15)
Anion gap: 9 (ref 5–15)
BUN: 41 mg/dL — ABNORMAL HIGH (ref 8–23)
BUN: 33 mg/dL — ABNORMAL HIGH (ref 8–23)
CO2: 22 mmol/L (ref 22–32)
CO2: 20 mmol/L — ABNORMAL LOW (ref 22–32)
Calcium: 7.9 mg/dL — ABNORMAL LOW (ref 8.9–10.3)
Calcium: 7.8 mg/dL — ABNORMAL LOW (ref 8.9–10.3)
Chloride: 118 mmol/L — ABNORMAL HIGH (ref 98–111)
Chloride: 120 mmol/L — ABNORMAL HIGH (ref 98–111)
Creatinine, Ser: 1.12 mg/dL — ABNORMAL HIGH (ref 0.44–1.00)
Creatinine, Ser: 0.94 mg/dL (ref 0.44–1.00)
GFR, Estimated: 46 mL/min — ABNORMAL LOW (ref >60)
GFR, Estimated: 56 mL/min — ABNORMAL LOW (ref >60)
Glucose, Bld: 111 mg/dL — ABNORMAL HIGH (ref 70–99)
Glucose, Bld: 101 mg/dL — ABNORMAL HIGH (ref 70–99)
Potassium: 3.3 mmol/L — ABNORMAL LOW (ref 3.5–5.1)
Potassium: 3.3 mmol/L — ABNORMAL LOW (ref 3.5–5.1)
Sodium: 149 mmol/L — ABNORMAL HIGH (ref 135–145)
Sodium: 149 mmol/L — ABNORMAL HIGH (ref 135–145)

## 2024-07-23 LAB — CBC WITH DIFFERENTIAL/PLATELET
Abs Immature Granulocytes: 0.23 K/uL — ABNORMAL HIGH (ref 0.00–0.07)
Basophils Absolute: 0 K/uL (ref 0.0–0.1)
Basophils Relative: 0 %
Eosinophils Absolute: 0 K/uL (ref 0.0–0.5)
Eosinophils Relative: 0 %
HCT: 33.6 % — ABNORMAL LOW (ref 36.0–46.0)
Hemoglobin: 11.1 g/dL — ABNORMAL LOW (ref 12.0–15.0)
Immature Granulocytes: 2 %
Lymphocytes Relative: 16 %
Lymphs Abs: 1.6 K/uL (ref 0.7–4.0)
MCH: 27.3 pg (ref 26.0–34.0)
MCHC: 33 g/dL (ref 30.0–36.0)
MCV: 82.8 fL (ref 80.0–100.0)
Monocytes Absolute: 0.5 K/uL (ref 0.1–1.0)
Monocytes Relative: 5 %
Neutro Abs: 8 K/uL — ABNORMAL HIGH (ref 1.7–7.7)
Neutrophils Relative %: 77 %
Platelets: 103 K/uL — ABNORMAL LOW (ref 150–400)
RBC: 4.06 MIL/uL (ref 3.87–5.11)
RDW: 20.5 % — ABNORMAL HIGH (ref 11.5–15.5)
WBC: 10.4 K/uL (ref 4.0–10.5)
nRBC: 1 % — ABNORMAL HIGH (ref 0.0–0.2)

## 2024-07-23 LAB — CULTURE, BLOOD (ROUTINE X 2)
Culture: NO GROWTH
Culture: NO GROWTH
Special Requests: ADEQUATE

## 2024-07-23 LAB — MAGNESIUM
Magnesium: 2.2 mg/dL (ref 1.7–2.4)
Magnesium: 2.2 mg/dL (ref 1.7–2.4)

## 2024-07-23 LAB — PHOSPHORUS: Phosphorus: 3.3 mg/dL (ref 2.5–4.6)

## 2024-07-23 LAB — GLUCOSE, CAPILLARY
Glucose-Capillary: 106 mg/dL — ABNORMAL HIGH (ref 70–99)
Glucose-Capillary: 110 mg/dL — ABNORMAL HIGH (ref 70–99)
Glucose-Capillary: 112 mg/dL — ABNORMAL HIGH (ref 70–99)
Glucose-Capillary: 113 mg/dL — ABNORMAL HIGH (ref 70–99)
Glucose-Capillary: 127 mg/dL — ABNORMAL HIGH (ref 70–99)
Glucose-Capillary: 96 mg/dL (ref 70–99)

## 2024-07-23 MED ORDER — SODIUM CHLORIDE 0.9 % IV BOLUS
1000.0000 mL | Freq: Once | INTRAVENOUS | Status: AC
  Administered 2024-07-23: 1000 mL via INTRAVENOUS

## 2024-07-23 NOTE — Plan of Care (Signed)
  Problem: Clinical Measurements: Goal: Ability to maintain clinical measurements within normal limits will improve Outcome: Progressing Goal: Will remain free from infection Outcome: Progressing Goal: Diagnostic test results will improve Outcome: Progressing Goal: Respiratory complications will improve Outcome: Progressing Goal: Cardiovascular complication will be avoided Outcome: Progressing   Problem: Education: Goal: Knowledge of General Education information will improve Description: Including pain rating scale, medication(s)/side effects and non-pharmacologic comfort measures Outcome: Not Progressing

## 2024-07-23 NOTE — Progress Notes (Signed)
 Progress Note   Patient: Molly Parks FMW:[id number] DOB: 1928-03-05 DOA: 07/17/2024     6 DOS: the patient was seen and examined on 07/23/2024    Brief Narrative:    89 y.o female with significant PMH of dementia, HTN, COPD, anxiety, sacral ulcer, FTT, CKD stage III, CVA and recurrent Klebsiella UTI who presented to the ED with chief complaints of altered mental status. Upon EMS arrival, the patient was initially unresponsive, with a blood pressure reading of 52/30, which improved to 85/58 after administering 1 liter of fluids. The patient was started on a Levophed  drip. Septic shock has resolved now. Transferred to [facility] as of 7/30. She was on hospice at home.      Assessment & Plan:   89 y.o female with significant PMH of dementia, HTN, COPD, anxiety, sacral ulcer, FTT, CKD stage III, CVA and recurrent Klebsiella UTI who presented to the ED with chief complaints of altered mental status.    Septic shock in the setting of UTI,POA: Resolved now Off of pressors Continue to monitor BP closely. Goal Map of 65 and above Finished 7-day course of ceftriaxone  Continue supplemental IV fluid as patient has reduced oral intake with intermittent hypoglycemia   Acute metabolic encephalopathy,POA: in the setting of UTI and advanced dementia. Mental status appears to be at baseline now.   Sacral decubitus ulcer: Continue wound care as per protocol.   CKD stage 3: No acute issues now   Moderate protein calorie malnutrition: As evidenced by low albumin in the setting of low calorie intake. Dietician on board.   COPD:  Continue with prn inhalational bronchodilator therapy   DVT prophylaxis: heparin  injection 5,000 Units Start: 07/18/24 0200       Code Status: Full Code   Family Communication: None present at bedside   Status is: Inpatient Disposition: Pending hospice placement       Subjective: Patient seen and examined at bedside this morning Appears lethargic Having evidence of  being chronically ill Refusing some medication   Examination:   General exam: Appears alert and awake but non-conversant Respiratory system: Clear to auscultation. Respiratory effort normal. Cardiovascular system: S1 & S2 heard, RRR. No JVD, murmurs, rubs, gallops or clicks. No pedal edema. Gastrointestinal system: Abdomen is nondistended, soft and nontender. No organomegaly or masses felt. Normal bowel sounds heard. Central nervous system: Alert and oriented. No focal neurological deficits. Extremities: Unable to assess power and sensory systems since she is not following commands    Data Reviewed:    Latest Ref Rng & Units 07/23/2024    4:47 AM 07/22/2024    5:38 AM 07/21/2024    5:25 AM  CBC  WBC 4.0 - 10.5 K/uL 10.4  14.4  14.5   Hemoglobin 12.0 - 15.0 g/dL 88.8  88.5  88.1   Hematocrit 36.0 - 46.0 % 33.6  34.4  37.1   Platelets 150 - 400 K/uL 103  106  112        Latest Ref Rng & Units 07/23/2024    4:47 AM 07/22/2024    5:38 AM 07/21/2024    3:39 AM  BMP  Glucose 70 - 99 mg/dL 888  78  82   BUN 8 - 23 mg/dL 41  46  40   Creatinine 0.44 - 1.00 mg/dL 8.87  8.59  8.71   Sodium 135 - 145 mmol/L 149  146  145   Potassium 3.5 - 5.1 mmol/L 3.3  4.1  4.3   Chloride 98 -  111 mmol/L 118  115  112   CO2 22 - 32 mmol/L 22  20  19    Calcium  8.9 - 10.3 mg/dL 7.9  8.0  8.0     Vitals:   07/22/24 2035 07/23/24 0519 07/23/24 0753 07/23/24 1154  BP: 114/66 116/70 107/71 108/63  Pulse: 77 90 89 92  Resp: 18 18 18 18   Temp: 98.2 F (36.8 C) 97.9 F (36.6 C) 97.6 F (36.4 C) 97.9 F (36.6 C)  TempSrc:      SpO2: 100% 100% 100% 100%  Weight:      Height:         Author: Drue ONEIDA Potter, MD 07/23/2024 4:39 PM  For on call review [email or web address].

## 2024-07-23 NOTE — Progress Notes (Signed)
 [facility] rm 121 [facility] Liaison Note   Molly Parks is a current hospice patient we follow at home for terminal diagnosis of CVD. Family called EMS with complaint of increased AMS. Patient was admitted to [facility] on 7.26.25 with Dx of sepsis with septic shock related to UTI. Per Dr. Norleen Laurence, this is a related hospital admission.    Patient resting in bed with eyes closed with no acute distress noted.  No family is at the bedside.  Patient with even unlabored respirations.  Per RN- patient had a run of SVT with HR 157 around 2:37 today.  Patient was asymptomatic.  Patient has refused several of her PO medications today.  No discharge plans today as patient continues to receive IV antibiotics for infection.   She is inpatient appropriate with need for IV fluids and IV antibiotics.   Vital Signs:   T 97.6, BP 107/71, P 89, R 18, Oxi 100% RA  I/O:   Intake Cumulative 694.23  Abnormal labs:   Na 149, K 3.3, Chloride 118, glucose 111, BUN 41, Creat 1.12, Ca 7.9, Alkaline Phos 35, Albumin 2.6, Total Protein 5.3, Bilirubin direct 0.3, GFR 46  Diagnostics: none new   IV PRN Meds:  Rocephin  1 gram IV q24 [facility]   Hospital Plan per Dr. Drue Potter progress note 7.31 @ 5:19 pm  1.Septic shock in the setting of UTI,POA: Resolved now Off of pressors Continue to monitor BP closely. Goal Map of 65 and above Finished 7-day course of ceftriaxone    2. Acute metabolic encephalopathy,POA: in the setting of UTI and advanced dementia. Mental status appears to be at baseline now.   3. Sacral decubitus ulcer: Continue wound care as per protocol.   4. CKD stage 3: No acute issues now   5. Moderate protein calorie malnutrition: As evidenced by low albumin in the setting of low calorie intake. Dietician on board.   6. COPD:  Continue with prn inhalational bronchodilator therapy   7. DVT prophylaxis: heparin  injection 5,000 Units Start: 07/18/24 0200    ____________________________  Goals of care: Ongoing, remains full code   Family communication: No family present. Left voicemail for son   IDT: Updated   Discharge planning: Ongoing and when patient is medically stable.  Please don't hesitate to reach out for any hospice related questions or concerns  [name] Na, RN Nurse Liaison [phone]

## 2024-07-23 NOTE — Progress Notes (Signed)
 Pt with run of SVT heart rate up to 157. Asymptomatic. MD made aware. Continuing to monitor.

## 2024-07-23 NOTE — Progress Notes (Addendum)
 Nutrition Follow-up  DOCUMENTATION CODES:   Severe malnutrition in context of chronic illness, Underweight  INTERVENTION:   -Continue dysphagia 1 diet with thin liquids -Continue Ensure Plus High Protein po BID, each supplement provides 350 kcal and 20 grams of protein  -Continue MVI with minerals daily -Continue 250 mg vitamin C  BID -Continue 100 mg thiamine  daily x 7 days -Feeding assistance with meals  NUTRITION DIAGNOSIS:   Severe Malnutrition related to chronic illness (COPD and dementia) as evidenced by percent weight loss, severe fat depletion, severe muscle depletion.  Ongoing  GOAL:   Patient will meet greater than or equal to 90% of their needs  Unmet  MONITOR:   PO intake, Supplement acceptance, Labs, Weight trends, Skin, I & O's  REASON FOR ASSESSMENT:   Rounds    ASSESSMENT:   89 y/o female with h/o CKD III, COPD, HTN, anxiety, dementia, MI and chronic wounds who is admitted with UTI, AMS, sepsis and AKI.  7/28- s/p BSE- NPO  7/29- s/p BSE- dysphagia 1 diet with thin liquids 7/30- transferred out of ICU  Reviewed I/O's: +694 ml x 24 hours and +2.5 L since admission   Pt lying in bed at time of visit, but arose to voice. Pt made eye contact with RD, but did not respond to questions. No family at bedside.   Noted meal completions 0%. Noted pt refusing medications and supplements.   Noted wt increase since admission, likely related to addition of IV fluids.   Noted pt was followed by home health hospice PTA. Given pt's advanced age, dementia, and COPD, suspect malnutrition and poor oral intake is chronic. Increased energy expenditure secondary to COPD and advanced dementia also contributing to malnutrition. Pt is at high refeeding risk. Pt also with chronic wounds; pt would require additional nutrition to support healing and is at risk for micronutrient deficiencies for chronic wounds; RD will hold off on drawing labs for supplementation until goals of  care are established. RD hesitant to recommend alternative means of nutrition/ hydration given pt's hospice status as well as advanced age/ dementia; TF would not enhance pt's quality of life.   Medications reviewed and include vitamin C , midodrine , thiamine , and dextrose  5%-0.9% sodium chloride  infusion @ 75 ml/hr.   Hospice following and has been trying to contact pt son.   Labs reviewed: Na: 149, K: 3.3, CBGS: 70-171 (inpatient orders for glycemic control are none).    Diet Order:   Diet Order             DIET - DYS 1 Room service appropriate? Yes with Assist; Fluid consistency: Thin  Diet effective now                   EDUCATION NEEDS:   Not appropriate for education at this time  Skin:  Skin Assessment: Reviewed RN Assessment (DTIs L & R heels, Stage II buttocks) Skin Integrity Issues:: Stage II, Other (Comment), DTI DTI: bilateral heels Stage II: rt/ lt sacrum Other: wound to rt lower buttocks  Last BM:  07/22/24 (type 5)  Height:   Ht Readings from Last 1 Encounters:  07/20/24 5' 9 (1.753 m)    Weight:   Wt Readings from Last 1 Encounters:  07/21/24 53.5 kg    Ideal Body Weight:  65.9 kg  BMI:  Body mass index is 17.42 kg/m.  Estimated Nutritional Needs:   Kcal:  1400-1600kcal/day  Protein:  70-80g/day  Fluid:  1.3-1.5L/day    Margery ORN, RD, LDN, CDCES  Registered Dietitian III Certified Diabetes Care and Education Specialist If unable to reach this RD, please use RD Inpatient group chat on secure chat between hours of 8am-4 pm daily

## 2024-07-23 NOTE — Progress Notes (Signed)
       CROSS COVER NOTE  NAME: Molly Parks MRN: [id number] DOB : 1928/06/14    Concern as stated by nurse / staff   Patient had a 20 beat run of V-tach per ccmd. Patient denied chest pain. VS 103/68 HR 85      Pertinent findings on chart review: 89 years old was on hospice at home but transferred to [facility] for septic shock secondary to UTI. Was on pressor but been off pressors for the last 48 hours. Completing Ceftriaxone  for 7 days. Talked with her son yesterday for goals of care and wanted to talk with his family. She remains full code   Patient Assessment     07/23/2024    9:29 PM 07/23/2024    7:30 PM 07/23/2024    4:56 PM  Vitals with BMI  Systolic 103 122 881  Diastolic 68 63 75  Pulse 89 81 86     Assessment and  Interventions   Assessment:  Ventricular tachycardia  Plan: Check mag and k and replete to keep mag over 2 and K over 4 Continue tele Will trial a small bolus K+ repletion ordered per pharmacy      Latest Ref Rng & Units 07/23/2024   10:24 PM 07/23/2024    4:47 AM 07/22/2024    5:38 AM  BMP  Glucose 70 - 99 mg/dL 898  888  78   BUN 8 - 23 mg/dL 33  41  46   Creatinine 0.44 - 1.00 mg/dL 9.05  8.87  8.59   Sodium 135 - 145 mmol/L 149  149  146   Potassium 3.5 - 5.1 mmol/L 3.3  3.3  4.1   Chloride 98 - 111 mmol/L 120  118  115   CO2 22 - 32 mmol/L 20  22  20    Calcium  8.9 - 10.3 mg/dL 7.8  7.9  8.0

## 2024-07-23 NOTE — Progress Notes (Signed)
 Dr Cleatus notified:Patient had a 20 beat run of V-tach per ccmd. Patient denied chest pain. VS 103/68 HR 85

## 2024-07-24 DIAGNOSIS — A419 Sepsis, unspecified organism: Secondary | ICD-10-CM | POA: Diagnosis not present

## 2024-07-24 DIAGNOSIS — R6520 Severe sepsis without septic shock: Secondary | ICD-10-CM | POA: Diagnosis not present

## 2024-07-24 LAB — CBC WITH DIFFERENTIAL/PLATELET
Abs Immature Granulocytes: 0.17 K/uL — ABNORMAL HIGH (ref 0.00–0.07)
Basophils Absolute: 0 K/uL (ref 0.0–0.1)
Basophils Relative: 0 %
Eosinophils Absolute: 0 K/uL (ref 0.0–0.5)
Eosinophils Relative: 0 %
HCT: 35.2 % — ABNORMAL LOW (ref 36.0–46.0)
Hemoglobin: 11.7 g/dL — ABNORMAL LOW (ref 12.0–15.0)
Immature Granulocytes: 2 %
Lymphocytes Relative: 19 %
Lymphs Abs: 1.8 K/uL (ref 0.7–4.0)
MCH: 27.5 pg (ref 26.0–34.0)
MCHC: 33.2 g/dL (ref 30.0–36.0)
MCV: 82.8 fL (ref 80.0–100.0)
Monocytes Absolute: 0.7 K/uL (ref 0.1–1.0)
Monocytes Relative: 7 %
Neutro Abs: 7 K/uL (ref 1.7–7.7)
Neutrophils Relative %: 72 %
Platelets: 98 K/uL — ABNORMAL LOW (ref 150–400)
RBC: 4.25 MIL/uL (ref 3.87–5.11)
RDW: 20.8 % — ABNORMAL HIGH (ref 11.5–15.5)
WBC: 9.7 K/uL (ref 4.0–10.5)
nRBC: 1 % — ABNORMAL HIGH (ref 0.0–0.2)

## 2024-07-24 LAB — BASIC METABOLIC PANEL WITH GFR
Anion gap: 6 (ref 5–15)
BUN: 31 mg/dL — ABNORMAL HIGH (ref 8–23)
CO2: 23 mmol/L (ref 22–32)
Calcium: 7.8 mg/dL — ABNORMAL LOW (ref 8.9–10.3)
Chloride: 123 mmol/L — ABNORMAL HIGH (ref 98–111)
Creatinine, Ser: 0.95 mg/dL (ref 0.44–1.00)
GFR, Estimated: 56 mL/min — ABNORMAL LOW (ref >60)
Glucose, Bld: 91 mg/dL (ref 70–99)
Potassium: 3.2 mmol/L — ABNORMAL LOW (ref 3.5–5.1)
Sodium: 152 mmol/L — ABNORMAL HIGH (ref 135–145)

## 2024-07-24 LAB — GLUCOSE, CAPILLARY
Glucose-Capillary: 91 mg/dL (ref 70–99)
Glucose-Capillary: 80 mg/dL (ref 70–99)
Glucose-Capillary: 83 mg/dL (ref 70–99)
Glucose-Capillary: 112 mg/dL — ABNORMAL HIGH (ref 70–99)
Glucose-Capillary: 109 mg/dL — ABNORMAL HIGH (ref 70–99)

## 2024-07-24 MED ORDER — POTASSIUM CL IN DEXTROSE 5% 20 MEQ/L IV SOLN
20.0000 meq | INTRAVENOUS | Status: DC
  Administered 2024-07-24 – 2024-07-25 (×2): 20 meq via INTRAVENOUS
  Filled 2024-07-24 (×3): qty 1000

## 2024-07-24 MED ORDER — POTASSIUM CHLORIDE CRYS ER 20 MEQ PO TBCR
40.0000 meq | EXTENDED_RELEASE_TABLET | Freq: Once | ORAL | Status: DC
  Filled 2024-07-24: qty 2

## 2024-07-24 MED ORDER — POTASSIUM CHLORIDE 20 MEQ PO PACK
40.0000 meq | PACK | Freq: Once | ORAL | Status: AC
  Administered 2024-07-24: 40 meq via ORAL
  Filled 2024-07-24: qty 2

## 2024-07-24 NOTE — Progress Notes (Addendum)
 Progress Note   Patient: Molly Parks FMW:[id number] DOB: 24-Sep-1928 DOA: 07/17/2024     7 DOS: the patient was seen and examined on 07/24/2024     Brief Narrative:    89 y.o female with significant PMH of dementia, HTN, COPD, anxiety, sacral ulcer, FTT, CKD stage III, CVA and recurrent Klebsiella UTI who presented to the ED with chief complaints of altered mental status. Upon EMS arrival, the patient was initially unresponsive, with a blood pressure reading of 52/30, which improved to 85/58 after administering 1 liter of fluids. The patient was started on a Levophed  drip. Septic shock has resolved now. Transferred to [facility] as of 7/30. She was on hospice at home.      Assessment & Plan:   89 y.o female with significant PMH of dementia, HTN, COPD, anxiety, sacral ulcer, FTT, CKD stage III, CVA and recurrent Klebsiella UTI who presented to the ED with chief complaints of altered mental status.    Septic shock in the setting of UTI,POA: Resolved now Off of pressors Continue to monitor BP closely. Goal Map of 65 and above Finished 7-day course of ceftriaxone  Continue supplemental IV fluid as patient has reduced oral intake with intermittent hypoglycemia    Hypokalemia likely secondary to poor oral intake We will keep on D5 W today Monitor sodium level closely  Hypokalemia-continue repletion and monitoring  Acute metabolic encephalopathy,POA: in the setting of UTI and advanced dementia. Mental status appears to be at baseline now.   Sacral decubitus ulcer: Continue wound care as per protocol.   CKD stage 3: No acute issues now   Moderate protein calorie malnutrition: As evidenced by low albumin in the setting of low calorie intake. Dietician on board.   COPD:  Continue with prn inhalational bronchodilator therapy   DVT prophylaxis: heparin  injection 5,000 Units Start: 07/18/24 0200       Code Status: Full Code   Family Communication: None present at bedside   Status is:  Inpatient Disposition: Pending hospice placement       Subjective: Patient seen and examined at bedside this morning Patient denies chest pain cough abdominal pain Did not have any acute overnight events Continues to remain lethargic   Examination:   General exam: Appears alert and awake but non-conversant Respiratory system: Clear to auscultation. Respiratory effort normal. Cardiovascular system: S1 & S2 heard, RRR. No JVD, murmurs, rubs, gallops or clicks. No pedal edema. Gastrointestinal system: Abdomen is nondistended, soft and nontender. No organomegaly or masses felt. Normal bowel sounds heard. Central nervous system: Alert and oriented. No focal neurological deficits. Extremities: Unable to assess power and sensory systems since she is not following commands    Data Reviewed:      Vitals:   07/23/24 2129 07/24/24 0042 07/24/24 0515 07/24/24 0750  BP: 103/68 126/68 131/71 130/74  Pulse: 89 95 95 78  Resp: 18   16  Temp:  98 F (36.7 C)  97.8 F (36.6 C)  TempSrc:  Oral    SpO2:  100% 100% 93%  Weight:      Height:          Latest Ref Rng & Units 07/24/2024    5:37 AM 07/23/2024    4:47 AM 07/22/2024    5:38 AM  CBC  WBC 4.0 - 10.5 K/uL 9.7  10.4  14.4   Hemoglobin 12.0 - 15.0 g/dL 88.2  88.8  88.5   Hematocrit 36.0 - 46.0 % 35.2  33.6  34.4   Platelets  150 - 400 K/uL 98  103  106        Latest Ref Rng & Units 07/24/2024    5:37 AM 07/23/2024   10:24 PM 07/23/2024    4:47 AM  BMP  Glucose 70 - 99 mg/dL 91  898  888   BUN 8 - 23 mg/dL 31  33  41   Creatinine 0.44 - 1.00 mg/dL 9.04  9.05  8.87   Sodium 135 - 145 mmol/L 152  149  149   Potassium 3.5 - 5.1 mmol/L 3.2  3.3  3.3   Chloride 98 - 111 mmol/L 123  120  118   CO2 22 - 32 mmol/L 23  20  22    Calcium  8.9 - 10.3 mg/dL 7.8  7.8  7.9      Author: Drue ONEIDA Potter, MD 07/24/2024 12:39 PM  For on call review [email or web address].

## 2024-07-24 NOTE — Progress Notes (Signed)
 [facility] Liaison Note   [name] is a current hospice patient we follow at home for terminal diagnosis of CVD. Family called EMS with complaint of increased AMS. Patient was admitted to [facility] on 7.26.25 with Dx of sepsis with septic shock related to UTI. Per Dr. Norleen Laurence, this is a related hospital admission.     Patient awake today and resting.  No visitors present.  Patient had a 20 beat run of V-tach last night and was asymptomatic.  VS were stable at 103/68, HR 85.  Patient denied any pain.  Patient continues to receive IV antibiotics for infection.  No discharge plans.   She is inpatient appropriate with need for IV fluids and IV antibiotics.   Vital Signs:   T 97.8, BP 130/74, P 78, R 16  Oxi 93% on RA   I/O:   Intake Cumulative Total 1532.42- No output recorded   Abnormal labs:   Na 152, K 3.2, Cl 123, BUN 31, Ca 7.8, GFR 56, HGB 11.7, HCG 35.2, RDW 20.8, Plat 98, nRBC 1.0   Diagnostics: none new   IV/ PRN Meds:  Rocephin  1 gram IV q24 H  Still refusing some of her PO meds- taking more than yesterday though   Hospital Plan per Dr. Drue Potter progress note 8.1 @ 4:39pm  Septic shock in the setting of UTI,POA: Resolved now Off of pressors Continue to monitor BP closely. Goal Map of 65 and above Finished 7-day course of ceftriaxone  Continue supplemental IV fluid as patient has reduced oral intake with intermittent hypoglycemia   Acute metabolic encephalopathy,POA: in the setting of UTI and advanced dementia. Mental status appears to be at baseline now.   Sacral decubitus ulcer: Continue wound care as per protocol.   CKD stage 3: No acute issues now   Moderate protein calorie malnutrition: As evidenced by low albumin in the setting of low calorie intake. Dietician on board.   COPD:  Continue with prn inhalational bronchodilator therapy   DVT prophylaxis: heparin  injection 5,000 Units Start: 07/18/24 0200    ____________________________   Goals of care: Ongoing, remains full code   Family communication: No family present. Left voicemail for son- messages full   IDT: Hospice team updated.   Continued collaboration with hospital medical team.   Discharge planning: Ongoing and when patient is medically stable.   Please don't hesitate to reach out for any hospice related questions or concerns   [name] Na, RN Nurse Liaison [phone]

## 2024-07-24 NOTE — Plan of Care (Signed)
  Problem: Education: Goal: Knowledge of General Education information will improve Description: Including pain rating scale, medication(s)/side effects and non-pharmacologic comfort measures Outcome: Not Progressing   Problem: Clinical Measurements: Goal: Ability to maintain clinical measurements within normal limits will improve Outcome: Progressing   Problem: Activity: Goal: Risk for activity intolerance will decrease Outcome: Not Progressing   Problem: Nutrition: Goal: Adequate nutrition will be maintained Outcome: Not Progressing

## 2024-07-25 DIAGNOSIS — A419 Sepsis, unspecified organism: Secondary | ICD-10-CM | POA: Diagnosis not present

## 2024-07-25 DIAGNOSIS — R6520 Severe sepsis without septic shock: Secondary | ICD-10-CM | POA: Diagnosis not present

## 2024-07-25 LAB — CBC WITH DIFFERENTIAL/PLATELET
Abs Immature Granulocytes: 0.13 K/uL — ABNORMAL HIGH (ref 0.00–0.07)
Basophils Absolute: 0 K/uL (ref 0.0–0.1)
Basophils Relative: 0 %
Eosinophils Absolute: 0.1 K/uL (ref 0.0–0.5)
Eosinophils Relative: 1 %
HCT: 29.2 % — ABNORMAL LOW (ref 36.0–46.0)
Hemoglobin: 9.9 g/dL — ABNORMAL LOW (ref 12.0–15.0)
Immature Granulocytes: 1 %
Lymphocytes Relative: 25 %
Lymphs Abs: 2.4 K/uL (ref 0.7–4.0)
MCH: 27.8 pg (ref 26.0–34.0)
MCHC: 33.9 g/dL (ref 30.0–36.0)
MCV: 82 fL (ref 80.0–100.0)
Monocytes Absolute: 0.8 K/uL (ref 0.1–1.0)
Monocytes Relative: 8 %
Neutro Abs: 6.4 K/uL (ref 1.7–7.7)
Neutrophils Relative %: 65 %
Platelets: 90 K/uL — ABNORMAL LOW (ref 150–400)
RBC: 3.56 MIL/uL — ABNORMAL LOW (ref 3.87–5.11)
RDW: 20.7 % — ABNORMAL HIGH (ref 11.5–15.5)
WBC: 9.8 K/uL (ref 4.0–10.5)
nRBC: 0.8 % — ABNORMAL HIGH (ref 0.0–0.2)

## 2024-07-25 LAB — BASIC METABOLIC PANEL WITH GFR
Anion gap: 6 (ref 5–15)
BUN: 26 mg/dL — ABNORMAL HIGH (ref 8–23)
CO2: 22 mmol/L (ref 22–32)
Calcium: 7.7 mg/dL — ABNORMAL LOW (ref 8.9–10.3)
Chloride: 120 mmol/L — ABNORMAL HIGH (ref 98–111)
Creatinine, Ser: 0.77 mg/dL (ref 0.44–1.00)
GFR, Estimated: >60 mL/min (ref >60)
Glucose, Bld: 110 mg/dL — ABNORMAL HIGH (ref 70–99)
Potassium: 3.8 mmol/L (ref 3.5–5.1)
Sodium: 148 mmol/L — ABNORMAL HIGH (ref 135–145)

## 2024-07-25 LAB — GLUCOSE, CAPILLARY
Glucose-Capillary: 96 mg/dL (ref 70–99)
Glucose-Capillary: 97 mg/dL (ref 70–99)
Glucose-Capillary: 87 mg/dL (ref 70–99)
Glucose-Capillary: 112 mg/dL — ABNORMAL HIGH (ref 70–99)

## 2024-07-25 MED ORDER — DEXTROSE 5 % IV SOLN: INTRAVENOUS | Status: AC

## 2024-07-25 NOTE — Progress Notes (Incomplete)
 [facility] Liaison Note   [name] is a current hospice patient we follow at home for terminal diagnosis of CVD. Family called EMS with complaint of increased AMS. Patient was admitted to [facility] on 7.26.25 with Dx of sepsis with septic shock related to UTI. Per Dr. Norleen Laurence, this is a related hospital admission.      Patient awake today and resting.  No visitors present.  Patient had a 20 beat run of V-tach last night and was asymptomatic.  VS were stable at 103/68, HR 85.  Patient denied any pain.  Patient continues to receive IV antibiotics for infection.  No discharge plans.   She is inpatient appropriate with need for IV fluids and IV antibiotics.   Vital Signs:   T 97.5, BP 109/71, P 83, R 16  Oxi 98% on RA   I/O:   No recorded   Abnormal labs:   Na 148, Cl 120, Glucose 110, BUN 26, Ca 7.7, GFR >60, RBC 3.56, HGB 9.9, HCT 29.2, RDW 20.7, Plat 90, nRBV 0.8   Diagnostics: none new   IV/ PRN Meds:  Rocephin  1 gram IV q24 H  Patient took her PO meds this morning   Hospital Plan per Dr. Drue Potter progress note 8.1 @ 4:39pm   Septic shock in the setting of UTI,POA: Resolved now Off of pressors Continue to monitor BP closely. Goal Map of 65 and above Finished 7-day course of ceftriaxone  Continue supplemental IV fluid as patient has reduced oral intake with intermittent hypoglycemia   Acute metabolic encephalopathy,POA: in the setting of UTI and advanced dementia. Mental status appears to be at baseline now.   Sacral decubitus ulcer: Continue wound care as per protocol.   CKD stage 3: No acute issues now   Moderate protein calorie malnutrition: As evidenced by low albumin in the setting of low calorie intake. Dietician on board.   COPD:  Continue with prn inhalational bronchodilator therapy   DVT prophylaxis: heparin  injection 5,000 Units Start: 07/18/24 0200   ____________________________   Goals of care: Ongoing,  remains full code   Family communication: No family present. Left voicemail for son- messages full   IDT: Hospice team updated.   Continued collaboration with hospital medical team.   Discharge planning: Ongoing and when patient is medically stable.   Please don't hesitate to reach out for any hospice related questions or concerns   [name] Na, RN Nurse Liaison [phone]

## 2024-07-25 NOTE — Progress Notes (Signed)
 Progress Note   Patient: Molly Parks FMW:[id number] DOB: [birth year] DOA: 07/17/2024     8 DOS: the patient was seen and examined on 07/25/2024     Brief Narrative:    89 y.o female with significant PMH of dementia, HTN, COPD, anxiety, sacral ulcer, FTT, CKD stage III, CVA and recurrent Klebsiella UTI who presented to the ED with chief complaints of altered mental status. Upon EMS arrival, the patient was initially unresponsive, with a blood pressure reading of 52/30, which improved to 85/58 after administering 1 liter of fluids. The patient was started on a Levophed  drip. Septic shock has resolved now. Transferred to [facility] as of 7/30. She was on hospice at home.      Assessment & Plan:   89 y.o female with significant PMH of dementia, HTN, COPD, anxiety, sacral ulcer, FTT, CKD stage III, CVA and recurrent Klebsiella UTI who presented to the ED with chief complaints of altered mental status.    Septic shock in the setting of UTI,POA: Resolved now Off of pressors Continue to monitor BP closely. Goal Map of 65 and above Finished 7-day course of ceftriaxone  Continue supplemental IV fluid as patient has reduced oral intake with intermittent hypoglycemia Hospice on board   Hypokalemia likely secondary to poor oral intake We will keep on D5 W today Monitor sodium level closely   Hypokalemia-continue repletion and monitoring   Acute metabolic encephalopathy,POA: in the setting of UTI and advanced dementia. Mental status appears to be at baseline now.   Sacral decubitus ulcer: Continue wound care as per protocol.   CKD stage 3: No acute issues now   Moderate protein calorie malnutrition: As evidenced by low albumin in the setting of low calorie intake. Dietician on board.   COPD:  Continue with prn inhalational bronchodilator therapy   DVT prophylaxis: heparin  injection 5,000 Units Start: 07/18/24 0200       Code Status: Full Code   Family Communication: Spoke with patient's  son over the phone today   Status is: Inpatient Disposition: Pending hospice placement     Subjective: Patient seen and examined at bedside this morning Patient still refusing some medications and not eating much Hide discussed with patient's nurse of caring and they will engage with palliative care for further goals of care discussion   Examination:   General exam: Appears alert and awake but non-conversant Respiratory system: Clear to auscultation. Respiratory effort normal. Cardiovascular system: S1 & S2 heard, RRR. No JVD, murmurs, rubs, gallops or clicks. No pedal edema. Gastrointestinal system: Abdomen is nondistended, soft and nontender. No organomegaly or masses felt. Normal bowel sounds heard. Central nervous system: Alert and oriented. No focal neurological deficits. Extremities: Unable to assess power and sensory systems since she is not following commands    Data Reviewed:    Latest Ref Rng & Units 07/25/2024    5:21 AM 07/24/2024    5:37 AM 07/23/2024    4:47 AM  CBC  WBC 4.0 - 10.5 K/uL 9.8  9.7  10.4   Hemoglobin 12.0 - 15.0 g/dL 9.9  88.2  88.8   Hematocrit 36.0 - 46.0 % 29.2  35.2  33.6   Platelets 150 - 400 K/uL 90  98  103        Latest Ref Rng & Units 07/25/2024    5:21 AM 07/24/2024    5:37 AM 07/23/2024   10:24 PM  BMP  Glucose 70 - 99 mg/dL 889  91  898   BUN 8 -  23 mg/dL 26  31  33   Creatinine 0.44 - 1.00 mg/dL 9.22  9.04  9.05   Sodium 135 - 145 mmol/L 148  152  149   Potassium 3.5 - 5.1 mmol/L 3.8  3.2  3.3   Chloride 98 - 111 mmol/L 120  123  120   CO2 22 - 32 mmol/L 22  23  20    Calcium  8.9 - 10.3 mg/dL 7.7  7.8  7.8      Vitals:   07/25/24 0500 07/25/24 0823 07/25/24 1143 07/25/24 1624  BP:  111/75 109/71 108/67  Pulse:  93 83 86  Resp:  16 16 19   Temp:  98.1 F (36.7 C) (!) 97.5 F (36.4 C) 98 F (36.7 C)  TempSrc:      SpO2:  100% 98% 97%  Weight: (!) 204 kg     Height:         Author: Drue ONEIDA Potter, MD 07/25/2024 4:43 PM  For on  call review [email or web address].

## 2024-07-25 NOTE — Plan of Care (Signed)

## 2024-07-25 NOTE — Plan of Care (Signed)
  Problem: Clinical Measurements: Goal: Ability to maintain clinical measurements within normal limits will improve Outcome: Progressing Goal: Will remain free from infection Outcome: Progressing Goal: Diagnostic test results will improve Outcome: Progressing Goal: Cardiovascular complication will be avoided Outcome: Progressing   Problem: Activity: Goal: Risk for activity intolerance will decrease Outcome: Progressing   

## 2024-07-26 DIAGNOSIS — R6520 Severe sepsis without septic shock: Secondary | ICD-10-CM | POA: Diagnosis not present

## 2024-07-26 DIAGNOSIS — J449 Chronic obstructive pulmonary disease, unspecified: Secondary | ICD-10-CM | POA: Diagnosis present

## 2024-07-26 DIAGNOSIS — R627 Adult failure to thrive: Secondary | ICD-10-CM | POA: Diagnosis present

## 2024-07-26 DIAGNOSIS — E870 Hyperosmolality and hypernatremia: Secondary | ICD-10-CM | POA: Diagnosis present

## 2024-07-26 DIAGNOSIS — Z66 Do not resuscitate: Secondary | ICD-10-CM

## 2024-07-26 DIAGNOSIS — E785 Hyperlipidemia, unspecified: Secondary | ICD-10-CM | POA: Diagnosis present

## 2024-07-26 DIAGNOSIS — R64 Cachexia: Secondary | ICD-10-CM | POA: Diagnosis present

## 2024-07-26 DIAGNOSIS — I472 Ventricular tachycardia, unspecified: Secondary | ICD-10-CM | POA: Diagnosis not present

## 2024-07-26 DIAGNOSIS — A419 Sepsis, unspecified organism: Secondary | ICD-10-CM | POA: Diagnosis present

## 2024-07-26 DIAGNOSIS — N390 Urinary tract infection, site not specified: Secondary | ICD-10-CM | POA: Diagnosis present

## 2024-07-26 DIAGNOSIS — I959 Hypotension, unspecified: Secondary | ICD-10-CM | POA: Diagnosis not present

## 2024-07-26 DIAGNOSIS — L89152 Pressure ulcer of sacral region, stage 2: Secondary | ICD-10-CM | POA: Diagnosis present

## 2024-07-26 DIAGNOSIS — R4182 Altered mental status, unspecified: Secondary | ICD-10-CM | POA: Diagnosis present

## 2024-07-26 DIAGNOSIS — F0390 Unspecified dementia without behavioral disturbance: Secondary | ICD-10-CM | POA: Diagnosis present

## 2024-07-26 DIAGNOSIS — N1831 Chronic kidney disease, stage 3a: Secondary | ICD-10-CM | POA: Diagnosis present

## 2024-07-26 DIAGNOSIS — R6521 Severe sepsis with septic shock: Secondary | ICD-10-CM | POA: Diagnosis present

## 2024-07-26 DIAGNOSIS — F419 Anxiety disorder, unspecified: Secondary | ICD-10-CM | POA: Diagnosis present

## 2024-07-26 DIAGNOSIS — G9341 Metabolic encephalopathy: Secondary | ICD-10-CM | POA: Diagnosis present

## 2024-07-26 DIAGNOSIS — Z7189 Other specified counseling: Secondary | ICD-10-CM | POA: Diagnosis not present

## 2024-07-26 DIAGNOSIS — E66812 Obesity, class 2: Secondary | ICD-10-CM | POA: Diagnosis not present

## 2024-07-26 DIAGNOSIS — E872 Acidosis, unspecified: Secondary | ICD-10-CM | POA: Diagnosis present

## 2024-07-26 DIAGNOSIS — Z515 Encounter for palliative care: Secondary | ICD-10-CM | POA: Diagnosis not present

## 2024-07-26 DIAGNOSIS — E43 Unspecified severe protein-calorie malnutrition: Secondary | ICD-10-CM | POA: Diagnosis present

## 2024-07-26 DIAGNOSIS — I129 Hypertensive chronic kidney disease with stage 1 through stage 4 chronic kidney disease, or unspecified chronic kidney disease: Secondary | ICD-10-CM | POA: Diagnosis present

## 2024-07-26 DIAGNOSIS — N179 Acute kidney failure, unspecified: Secondary | ICD-10-CM | POA: Diagnosis present

## 2024-07-26 DIAGNOSIS — Z681 Body mass index (BMI) 19 or less, adult: Secondary | ICD-10-CM | POA: Diagnosis not present

## 2024-07-26 DIAGNOSIS — R1310 Dysphagia, unspecified: Secondary | ICD-10-CM | POA: Diagnosis present

## 2024-07-26 DIAGNOSIS — Z789 Other specified health status: Secondary | ICD-10-CM

## 2024-07-26 DIAGNOSIS — E876 Hypokalemia: Secondary | ICD-10-CM | POA: Diagnosis not present

## 2024-07-26 LAB — BASIC METABOLIC PANEL WITH GFR
Anion gap: 6 (ref 5–15)
BUN: 20 mg/dL (ref 8–23)
CO2: 21 mmol/L — ABNORMAL LOW (ref 22–32)
Calcium: 7.4 mg/dL — ABNORMAL LOW (ref 8.9–10.3)
Chloride: 114 mmol/L — ABNORMAL HIGH (ref 98–111)
Creatinine, Ser: 0.9 mg/dL (ref 0.44–1.00)
GFR, Estimated: 59 mL/min — ABNORMAL LOW (ref >60)
Glucose, Bld: 111 mg/dL — ABNORMAL HIGH (ref 70–99)
Potassium: 3.6 mmol/L (ref 3.5–5.1)
Sodium: 141 mmol/L (ref 135–145)

## 2024-07-26 LAB — GLUCOSE, CAPILLARY
Glucose-Capillary: 110 mg/dL — ABNORMAL HIGH (ref 70–99)
Glucose-Capillary: 124 mg/dL — ABNORMAL HIGH (ref 70–99)
Glucose-Capillary: 81 mg/dL (ref 70–99)
Glucose-Capillary: 88 mg/dL (ref 70–99)
Glucose-Capillary: 92 mg/dL (ref 70–99)
Glucose-Capillary: 98 mg/dL (ref 70–99)

## 2024-07-26 LAB — CBC WITH DIFFERENTIAL/PLATELET
Abs Immature Granulocytes: 0.12 K/uL — ABNORMAL HIGH (ref 0.00–0.07)
Basophils Absolute: 0 K/uL (ref 0.0–0.1)
Basophils Relative: 0 %
Eosinophils Absolute: 0.1 K/uL (ref 0.0–0.5)
Eosinophils Relative: 1 %
HCT: 30.3 % — ABNORMAL LOW (ref 36.0–46.0)
Hemoglobin: 10.2 g/dL — ABNORMAL LOW (ref 12.0–15.0)
Immature Granulocytes: 1 %
Lymphocytes Relative: 20 %
Lymphs Abs: 2.1 K/uL (ref 0.7–4.0)
MCH: 28 pg (ref 26.0–34.0)
MCHC: 33.7 g/dL (ref 30.0–36.0)
MCV: 83.2 fL (ref 80.0–100.0)
Monocytes Absolute: 0.9 K/uL (ref 0.1–1.0)
Monocytes Relative: 8 %
Neutro Abs: 7.4 K/uL (ref 1.7–7.7)
Neutrophils Relative %: 70 %
Platelets: 87 K/uL — ABNORMAL LOW (ref 150–400)
RBC: 3.64 MIL/uL — ABNORMAL LOW (ref 3.87–5.11)
RDW: 21.6 % — ABNORMAL HIGH (ref 11.5–15.5)
Smear Review: NORMAL
WBC: 10.6 K/uL — ABNORMAL HIGH (ref 4.0–10.5)
nRBC: 0.5 % — ABNORMAL HIGH (ref 0.0–0.2)

## 2024-07-26 MED ORDER — SODIUM CHLORIDE 0.45 % IV SOLN: INTRAVENOUS | Status: DC

## 2024-07-26 MED ORDER — DEXTROSE-SODIUM CHLORIDE 5-0.45 % IV SOLN: INTRAVENOUS | Status: AC

## 2024-07-26 NOTE — Plan of Care (Signed)
 The patient continues on IVF  D5 @ 100ml/hr. The patient spit out her bedtime medications.

## 2024-07-26 NOTE — Consult Note (Signed)
 Consultation Note Date: 07/26/2024 at 11 AM  Patient Name: Molly Parks  DOB: [birth year]  MRN: [id number]  Age / Sex: 89 y.o., female  PCP: Eliverto Bette Hover, MD Referring Physician: Dorinda Drue DASEN, MD  HPI/Patient Profile: 89 y.o. female  with past medical history significant for dementia, HTN, COPD, anxiety, sacral ulcer, FTT, CKD stage III, CVA and recurrent Klebsiella UTI. Patient presented to ED 07/17/2024 from home c/o AMS and poor p.o. intake.  On scene, patient was initially unresponsive with BP of 52/30.  Improved response with BP 85/58 after 1 liter fluid.  EMS started Levophed  drip.  Patient was more alert on arrival to ED and was able to state name correctly but unable to provide additional history.  ED vital signs BP 114/70, HR 92, RR 19, SpO2 100% RA, 98.7 F. ED labs significant for Na+ 150, K+ 4.2, glucose 158, BUN 44, creatinine 1.42.  BBC 13.2, platelets 124, lactic acid 3.8, troponin 142.  CXR, CT head, CTA chest, CTAP negative.  Patient was admitted to [facility] for septic shock due to UTI, AMS, AKI on CKD stage III, elevated troponin and FTT.  Initially PMT was consulted but deferred due to patient being actively cared for by [facility] home hospice and was being followed by hospice liaison's during her admission.  PMT was reconsulted 07/25/2024 to clarify goals of care after family wish to pursue full aggressive treatment with all interventions including CPR and intubation and family refusal to continue with current home hospice provider.  Clinical Assessment and Goals of Care: Extensive chart review completed prior to meeting patient including labs, vital signs, imaging, progress notes, orders, and available advanced directive documents from current and previous encounters. I then met with patient and son to discuss diagnosis prognosis, GOC, EOL wishes, disposition and options.  I introduced  Palliative Medicine as specialized medical care for people living with serious illness. It focuses on providing relief from the symptoms and stress of a serious illness. The goal is to improve quality of life for both the patient and the family.  We discussed a brief life review of the patient.  Molly Parks (son) at bedside provides information about his mother.  He shares that she and his father were married over 60 years.  They had 3 boys, 2 are deceased.  Molly Parks is the only remaining child.  She has 11 grandchildren and numerous great-grandchildren.  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  As far as functional and nutritional status Molly Parks states prior to her June 2025 admission for CVA his mother was able to get around their home with a walker.  After her discharge home in June she was bedbound and had poor appetite.  She was still interactive with family and friends and is able to state her name, name family members and was oriented to location and situation.  She was under home hospice care with [facility].   We discussed patient's current illness and what it means in  the larger context of patient's on-going co-morbidities.  Natural disease trajectory and expectations at EOL were discussed.  I attempted to elicit values and goals of care important to the patient.  Molly Parks states he would like for his mother to come back home under hospice care.  He requests a list of other agencies as he no longer wants to use [facility].   The difference between aggressive medical intervention and comfort care was considered in light of the patient's goals of care.   Advance directives, concepts specific to code status, artificial feeding and hydration, and rehospitalization were considered and discussed.  When discussing CPR and intubation, Molly Parks shares that he has considered and does not want his mother to have CPR.  He is amenable to using mechanical ventilation in  the event it were necessary.  He shares that a temporary feeding tube could be placed if it meant his mother would get better.  DNR and MOST form was completed, scanned into epic and placed into physical chart.  Education offered regarding concept specific to human mortality and the limitations of medical interventions to prolong life when the body begins to fail to thrive.  Molly Parks understands that his mother was critically ill and is now doing better.  Discussed need for reassessing response to medications and status daily as conversations could change due to change in his mother's status.  Family is facing treatment option decisions, advanced directive, and anticipatory care needs.  Molly Parks shares his mother has living will and healthcare documentation at home that he will provide to be scanned into epic.   Discussed with patient/family the importance of continued conversation with family and the medical providers regarding overall plan of care and treatment options, ensuring decisions are within the context of the patient's values and GOCs.    Hospice and Palliative Care services outpatient were explained and offered.  Patient's son request list of home hospice agencies.  Request placed to TOC.  Questions and concerns were addressed. The family was encouraged to call with questions or concerns.   Primary Decision Maker NEXT OF Molly Parks, son, [place]  Physical Exam Vitals reviewed.  Constitutional:      General: She is not in acute distress.    Appearance: She is ill-appearing.     Comments: Frail  HENT:     Head: Normocephalic and atraumatic.     Mouth/Throat:     Mouth: Mucous membranes are dry.  Pulmonary:     Effort: Pulmonary effort is normal. No respiratory distress.  Musculoskeletal:     Right lower leg: No edema.     Left lower leg: No edema.  Skin:    General: Skin is warm and dry.  Neurological:     Mental Status: She is alert. She is disoriented.     Motor: Weakness  present.  Psychiatric:        Mood and Affect: Mood normal.        Behavior: Behavior normal.    Recommendations/Plan: DNR/MOST completed- Uploaded to Epic via Vynca, signed DNR/MOST placed in paper chart Continue current supportive interventions Plan to discharge home with home hospice when medically stable TOC to provide list of home hospice agencies in the area  Palliative Assessment/Data: 20%   Discussed plan of care with attending MD, primary RN and TOC.  Thank you for this consult. Palliative medicine will continue to follow and assist holistically.   Time Total: 90 minutes  Time spent includes: Detailed review of medical records (labs, imaging, vital  signs), medically appropriate exam (mental status, respiratory, cardiac, skin), discussed with treatment team, counseling and educating patient, family and staff, documenting clinical information, medication management and coordination of care.     Devere Sacks, AMANDA [facility] Palliative Medicine Team  07/26/2024 9:54 AM  Office [phone]  Pager [phone]     Please contact Palliative Medicine Team providers via AMION for questions and concerns.

## 2024-07-26 NOTE — Progress Notes (Signed)
 [facility] rm [facility] Liaison Note  Patient's son revoked her Hospice Medicare Benefit today.    Please call with any hospice related questions or concerns.  [facility] Liaison [phone]

## 2024-07-26 NOTE — Progress Notes (Signed)
 Progress Note   Patient: Molly Parks FMW:[id number] DOB: 1928-04-06 DOA: 07/17/2024     9 DOS: the patient was seen and examined on 07/26/2024      Brief Narrative:    89 y.o female with significant PMH of dementia, HTN, COPD, anxiety, sacral ulcer, FTT, CKD stage III, CVA and recurrent Klebsiella UTI who presented to the ED with chief complaints of altered mental status. Upon EMS arrival, the patient was initially unresponsive, with a blood pressure reading of 52/30, which improved to 85/58 after administering 1 liter of fluids. The patient was started on a Levophed  drip. Septic shock has resolved now. Transferred to [facility] Service as of 7/30. She was on hospice at home.      Assessment & Plan:   89 y.o female with significant PMH of dementia, HTN, COPD, anxiety, sacral ulcer, FTT, CKD stage III, CVA and recurrent Klebsiella UTI who presented to the ED with chief complaints of altered mental status.    Septic shock in the setting of UTI,POA: Resolved now Off of pressors Continue to monitor BP closely. Goal Map of 65 and above Finished 7-day course of ceftriaxone  Continue supplemental IV fluid as patient has reduced oral intake with intermittent hypoglycemia Hospice on board   Hypokalemia likely secondary to poor oral intake We will keep on D5 W today Monitor sodium level closely   Hypoglycemia secondary to poor oral intake Patient on dextrose  infusion Palliative care discussing with patient concerning goals of care and possibility of tube feeding Monitor glucose closely  Hypokalemia-continue repletion and monitoring   Acute metabolic encephalopathy,POA: in the setting of UTI and advanced dementia. Mental status appears to be at baseline now.   Sacral decubitus ulcer: Continue wound care as per protocol.   CKD stage 3: No acute issues now   Moderate protein calorie malnutrition: As evidenced by low albumin in the setting of low calorie intake. Dietician on board.   COPD:   Continue with prn inhalational bronchodilator therapy   DVT prophylaxis: heparin  injection 5,000 Units Start: 07/18/24 0200       Code Status: Full Code   Family Communication: Spoke with patient's son over the phone today   Status is: Inpatient Disposition: Pending hospice placement     Subjective: Patient's son has revoked her hospice services with a current hospice provider but willing to look into other hospice facilities in the county at discharge Palliative care following closely Still appears lethargic continues to be on IV fluid   Examination:   General exam: Appears alert and awake but non-conversant Respiratory system: Clear to auscultation. Respiratory effort normal. Cardiovascular system: S1 & S2 heard, RRR. No JVD, murmurs, rubs, gallops or clicks. No pedal edema. Gastrointestinal system: Abdomen is nondistended, soft and nontender. No organomegaly or masses felt. Normal bowel sounds heard. Central nervous system: Alert and oriented. No focal neurological deficits. Extremities: Unable to assess power and sensory systems since she is not following commands    Data Reviewed:   Vitals:   07/25/24 2000 07/26/24 0414 07/26/24 0748 07/26/24 1556  BP: 122/71 109/69 121/66 125/70  Pulse: 76 83 84 81  Resp: 18 16 16  (!) 22  Temp: 98.1 F (36.7 C) 98.9 F (37.2 C) 97.8 F (36.6 C) 98.1 F (36.7 C)  TempSrc:      SpO2: 95% 97% 99% 98%  Weight:      Height:          Latest Ref Rng & Units 07/26/2024    5:44 AM 07/25/2024  5:21 AM 07/24/2024    5:37 AM  CBC  WBC 4.0 - 10.5 K/uL 10.6  9.8  9.7   Hemoglobin 12.0 - 15.0 g/dL 89.7  9.9  88.2   Hematocrit 36.0 - 46.0 % 30.3  29.2  35.2   Platelets 150 - 400 K/uL 87  90  98        Latest Ref Rng & Units 07/26/2024    5:44 AM 07/25/2024    5:21 AM 07/24/2024    5:37 AM  BMP  Glucose 70 - 99 mg/dL 888  889  91   BUN 8 - 23 mg/dL 20  26  31    Creatinine 0.44 - 1.00 mg/dL 9.09  9.22  9.04   Sodium 135 - 145 mmol/L 141   148  152   Potassium 3.5 - 5.1 mmol/L 3.6  3.8  3.2   Chloride 98 - 111 mmol/L 114  120  123   CO2 22 - 32 mmol/L 21  22  23    Calcium  8.9 - 10.3 mg/dL 7.4  7.7  7.8      Author: Drue ONEIDA Potter, MD 07/26/2024 5:20 PM  For on call review [email or web address].

## 2024-07-27 DIAGNOSIS — R6520 Severe sepsis without septic shock: Secondary | ICD-10-CM | POA: Diagnosis not present

## 2024-07-27 DIAGNOSIS — E43 Unspecified severe protein-calorie malnutrition: Secondary | ICD-10-CM

## 2024-07-27 DIAGNOSIS — I959 Hypotension, unspecified: Secondary | ICD-10-CM

## 2024-07-27 DIAGNOSIS — A419 Sepsis, unspecified organism: Secondary | ICD-10-CM | POA: Diagnosis not present

## 2024-07-27 DIAGNOSIS — R4182 Altered mental status, unspecified: Secondary | ICD-10-CM

## 2024-07-27 DIAGNOSIS — Z515 Encounter for palliative care: Secondary | ICD-10-CM

## 2024-07-27 LAB — CBC WITH DIFFERENTIAL/PLATELET
Abs Immature Granulocytes: 0.09 K/uL — ABNORMAL HIGH (ref 0.00–0.07)
Basophils Absolute: 0 K/uL (ref 0.0–0.1)
Basophils Relative: 0 %
Eosinophils Absolute: 0 K/uL (ref 0.0–0.5)
Eosinophils Relative: 0 %
HCT: 31.4 % — ABNORMAL LOW (ref 36.0–46.0)
Hemoglobin: 10.9 g/dL — ABNORMAL LOW (ref 12.0–15.0)
Immature Granulocytes: 1 %
Lymphocytes Relative: 19 %
Lymphs Abs: 2 K/uL (ref 0.7–4.0)
MCH: 27.9 pg (ref 26.0–34.0)
MCHC: 34.7 g/dL (ref 30.0–36.0)
MCV: 80.5 fL (ref 80.0–100.0)
Monocytes Absolute: 0.8 K/uL (ref 0.1–1.0)
Monocytes Relative: 7 %
Neutro Abs: 7.8 K/uL — ABNORMAL HIGH (ref 1.7–7.7)
Neutrophils Relative %: 73 %
Platelets: 78 K/uL — ABNORMAL LOW (ref 150–400)
RBC: 3.9 MIL/uL (ref 3.87–5.11)
RDW: 21.6 % — ABNORMAL HIGH (ref 11.5–15.5)
Smear Review: NORMAL
WBC: 10.8 K/uL — ABNORMAL HIGH (ref 4.0–10.5)
nRBC: 0.3 % — ABNORMAL HIGH (ref 0.0–0.2)

## 2024-07-27 LAB — GLUCOSE, CAPILLARY
Glucose-Capillary: 71 mg/dL (ref 70–99)
Glucose-Capillary: 73 mg/dL (ref 70–99)
Glucose-Capillary: 81 mg/dL (ref 70–99)
Glucose-Capillary: 81 mg/dL (ref 70–99)
Glucose-Capillary: 85 mg/dL (ref 70–99)
Glucose-Capillary: 89 mg/dL (ref 70–99)

## 2024-07-27 LAB — BASIC METABOLIC PANEL WITH GFR
Anion gap: 7 (ref 5–15)
BUN: 18 mg/dL (ref 8–23)
CO2: 17 mmol/L — ABNORMAL LOW (ref 22–32)
Calcium: 7.7 mg/dL — ABNORMAL LOW (ref 8.9–10.3)
Chloride: 113 mmol/L — ABNORMAL HIGH (ref 98–111)
Creatinine, Ser: 0.88 mg/dL (ref 0.44–1.00)
GFR, Estimated: >60 mL/min (ref >60)
Glucose, Bld: 78 mg/dL (ref 70–99)
Potassium: 3.7 mmol/L (ref 3.5–5.1)
Sodium: 137 mmol/L (ref 135–145)

## 2024-07-27 MED ORDER — GLUCOSE 40 % PO GEL
1.0000 | Freq: Once | ORAL | Status: AC | PRN
  Administered 2024-07-28: 31 g via ORAL
  Filled 2024-07-27: qty 1

## 2024-07-27 NOTE — Progress Notes (Addendum)
 Palliative Care Progress Note, Assessment & Plan   Patient Name: Molly Parks       Date: 07/27/2024 DOB: 27-Jun-1928  Age: 89 y.o. MRN#: [id number] Attending Physician: Dorinda Drue DASEN, MD Primary Care Physician: Eliverto Bette Hover, MD Admit Date: 07/17/2024  Subjective: Patient is lying in bed in no apparent distress.  She is awake and alert.  She acknowledges my presence.  Nurse tech is at bedside attempting to get blood sugar.  No family or friends at bedside during my visit.  HPI: 89 y.o. female  with past medical history significant for dementia, HTN, COPD, anxiety, sacral ulcer, FTT, CKD stage III, CVA and recurrent Klebsiella UTI. Patient presented to ED 07/17/2024 from home c/o AMS and poor p.o. intake.  On scene, patient was initially unresponsive with BP of 52/30.  Improved response with BP 85/58 after 1 liter fluid.  EMS started Levophed  drip.  Patient was more alert on arrival to ED and was able to state name correctly but unable to provide additional history.   ED vital signs BP 114/70, HR 92, RR 19, SpO2 100% RA, 98.7 F. ED labs significant for Na+ 150, K+ 4.2, glucose 158, BUN 44, creatinine 1.42.  BBC 13.2, platelets 124, lactic acid 3.8, troponin 142.  CXR, CT head, CTA chest, CTAP negative.   Patient was admitted to [facility] for septic shock due to UTI, AMS, AKI on CKD stage III, elevated troponin and FTT.   Initially PMT was consulted but deferred due to patient being actively cared for by [facility] home hospice and was being followed by hospice liaison's during her admission.   PMT was reconsulted 07/25/2024 to clarify goals of care after family wish to pursue full aggressive treatment with all interventions including CPR and intubation and family refusal to continue with current home hospice  provider.  Summary of counseling/coordination of care: Extensive chart review completed prior to meeting patient including labs, vital signs, imaging, progress notes, orders, and available advanced directive documents from current and previous encounters.   After reviewing the patient's chart and assessing the patient at bedside, I spoke with patient in regards to symptom management and goals of care.   Symptoms assessed.  While patient is able to acknowledge my presence she cannot participate in complete symptom assessment.  Nonverbal signs of pain or discomfort such as brow furling, grimacing, fidgeting, or moaning not noted.  Patient appears to be comfortable at this time.  No adjustment to [facility] needed.  Plan remains for patient to discharge home with hospice services.  TOC following closely for discharge planning.  After review of patient's goldenrod form and MOST form, I am concerned that goals do not align with hospice values and philosophy.  Therefore, I spoke with patient's son over the phone.  Reviewed that he met with my colleague over the weekend to discuss boundaries and goals of care.  Offered support and asked how I could be helpful for patient and family today.  Patient's son shares that he wants his mother to be fed.  He shares she is fighting a UTI and has no gas in the tank.  Asked if patient was requesting food or drink.  He said no.  Reviewed that terminal anorexia is common in patients with late stages of dementia.  Reviewed the difference between withholding/starving.  He again states that he is asking for nutrition and that he is not understanding why he cannot get that for her at this time.  Discussed placement of a temporary feeding tube.  Pros and cons as well as poor prognostic indicators were reviewed, which are as follows:  Age greater than 75 UTI low BMI albumin less than 3 Hospitalized bed ridden presence of pressure sores Confusion  Discussed that studies  (1999 JAMA Finucane et al) found no evidence that tube feeding in patients with advanced dementia prolongs survival, prevents aspiration pneumonia, reduces the risk of pressure sores or infections, improved function, or provides comfort.   [name] LAZAR, [name], [name]. Tube feeding in patients with advanced dementia: a review of the evidence. JAMA. 1999 Oct 13;282(14):1365-70. doi: 10.1001/jama.717.85.8634. PMID: [id number].  Son shares that he wants his mother to be fed.  I shared that it is not recommended that artificial nutrition/hydration be placed.   Of note, I confirmed with [name] of [facility] that feeding tubes are majority of the time removed prior to patient coming on hospice services.  Additionally, if feeding tubes are in place on a rare occasion, they are reduced with trickle feeds and removed.  Additionally, discussed that should patient returned home with hospice services that hospice does not manage feeding tubes.  Highlighted that hospice services focus on quality of life and avoidance of life-prolonging or artificial means of sustaining life.   Patient's son seemed surprised that patient could not return to hospice services with a feeding tube in place.  Therapeutic silence, active listening, and emotional support provided.  He shared he needed to go to the bathroom and requested that I call him back at a later time.  PMT will continue to follow and support.  Physical Exam Vitals reviewed.  Constitutional:      Comments: thin  HENT:     Nose: Nose normal.     Mouth/Throat:     Comments: Missing teeth Eyes:     Pupils: Pupils are equal, round, and reactive to light.  Pulmonary:     Effort: Pulmonary effort is normal.  Abdominal:     Palpations: Abdomen is soft.  Skin:    General: Skin is warm and dry.  Neurological:     Mental Status: She is alert.  Psychiatric:        Mood and Affect: Mood normal.        Behavior: Behavior normal.        Judgment: Judgment  normal.             Total Time 50 minutes   Time spent includes: Detailed review of medical records (labs, imaging, vital signs), medically appropriate exam (mental status, respiratory, cardiac, skin), discussed with treatment team, counseling and educating patient, family and staff, documenting clinical information, medication management and coordination of care.  Lamarr L. Arvid, DNP, FNP-BC Palliative Medicine Team

## 2024-07-27 NOTE — Plan of Care (Signed)
 The patient continues IVF D5 with 0.45% sodium chloride  at 40Ml/hr to IV in R arm. The patient spit out her bedtime meds. Turned and repositioned every 2 hours.

## 2024-07-27 NOTE — Progress Notes (Signed)
 Progress Note   Patient: Molly Parks FMW:[id number] DOB: 01/08/1928 DOA: 07/17/2024     10 DOS: the patient was seen and examined on 07/27/2024   Brief Narrative:    89 y.o female with significant PMH of dementia, HTN, COPD, anxiety, sacral ulcer, FTT, CKD stage III, CVA and recurrent Klebsiella UTI who presented to the ED with chief complaints of altered mental status. Upon EMS arrival, the patient was initially unresponsive, with a blood pressure reading of 52/30, which improved to 85/58 after administering 1 liter of fluids. The patient was started on a Levophed  drip. Septic shock has resolved now. Transferred to [facility] Service as of 7/30. She was on hospice at home.      Assessment & Plan:   Septic shock in the setting of UTI,POA: Resolved now Off of pressors Continue to monitor BP closely. Goal Map of 65 and above Finished 7-day course of ceftriaxone  Continue supplemental IV fluid as patient has reduced oral intake with intermittent hypoglycemia TOC working on another hospice provider acceptance   Hypokalemia-improved   Hypoglycemia secondary to poor oral intake Patient on dextrose  infusion Palliative care discussing with patient's family concerning goals of care and possibility of tube feeding Monitor glucose closely   Hypokalemia-continue repletion and monitoring   Acute metabolic encephalopathy,POA: in the setting of UTI and advanced dementia. Mental status appears to be at baseline now.   Sacral decubitus ulcer: Continue wound care as per protocol.   CKD stage 3: No acute issues now   Moderate protein calorie malnutrition: As evidenced by low albumin in the setting of low calorie intake. Dietician on board.   COPD:  Continue with prn inhalational bronchodilator therapy   DVT prophylaxis: heparin  injection 5,000 Units Start: 07/18/24 0200       Code Status: Full Code   Family Communication: Spoke with patient's son over the phone today   Status is:  Inpatient Disposition: Pending discharge home with hospice     Subjective: Patient's son has revoked her hospice services with [facility] hospice services. Has been seen by palliative medicine and goals of care coordinated as patient is wish for discharge home with home hospice.  They are interested in exploring other hospice providers in the [place] before discharge.  TOC is working on options.   Examination:   General exam: Appears alert and awake but non-conversant Respiratory system: Clear to auscultation. Respiratory effort normal. Cardiovascular system: S1 & S2 heard, RRR. No JVD, murmurs, rubs, gallops or clicks. No pedal edema. Gastrointestinal system: Abdomen is nondistended, soft and nontender. No organomegaly or masses felt. Normal bowel sounds heard. Central nervous system: Alert and oriented. No focal neurological deficits. Extremities: Unable to assess power and sensory systems since she is not following commands    Data Reviewed:    Vitals:   07/26/24 2103 07/27/24 0440 07/27/24 0455 07/27/24 0814  BP: 108/84  108/71 (!) 103/59  Pulse: 78  90 83  Resp: 18  18 20   Temp: (!) 97.5 F (36.4 C)  97.8 F (36.6 C) 98.2 F (36.8 C)  TempSrc:   Oral Oral  SpO2: 94%  95% 97%  Weight:  74 kg    Height:          Latest Ref Rng & Units 07/27/2024    5:56 AM 07/26/2024    5:44 AM 07/25/2024    5:21 AM  CBC  WBC 4.0 - 10.5 K/uL 10.8  10.6  9.8   Hemoglobin 12.0 - 15.0 g/dL 89.0  89.7  9.9   Hematocrit 36.0 - 46.0 % 31.4  30.3  29.2   Platelets 150 - 400 K/uL 78  87  90        Latest Ref Rng & Units 07/27/2024    5:56 AM 07/26/2024    5:44 AM 07/25/2024    5:21 AM  BMP  Glucose 70 - 99 mg/dL 78  888  889   BUN 8 - 23 mg/dL 18  20  26    Creatinine 0.44 - 1.00 mg/dL 9.11  9.09  9.22   Sodium 135 - 145 mmol/L 137  141  148   Potassium 3.5 - 5.1 mmol/L 3.7  3.6  3.8   Chloride 98 - 111 mmol/L 113  114  120   CO2 22 - 32 mmol/L 17  21  22    Calcium  8.9 - 10.3 mg/dL 7.7  7.4   7.7      Author: Drue ONEIDA Potter, MD 07/27/2024 12:33 PM  For on call review [email or web address].

## 2024-07-27 NOTE — Progress Notes (Signed)
 Nutrition Follow-up  DOCUMENTATION CODES:   Severe malnutrition in context of chronic illness, Underweight  INTERVENTION:   -Continue dysphagia 1 diet with thin liquids -Continue Ensure Plus High Protein po BID, each supplement provides 350 kcal and 20 grams of protein  -Continue MVI with minerals daily -Continue 250 mg vitamin C  BID -Continue 100 mg thiamine  daily x 7 days -Continue feeding assistance with meals -RD will sign off; if further nutrition needs arise, please re-consult RD  NUTRITION DIAGNOSIS:   Severe Malnutrition related to chronic illness (COPD and dementia) as evidenced by percent weight loss, severe fat depletion, severe muscle depletion.  Ongoing  GOAL:   Patient will meet greater than or equal to 90% of their needs  Unmet  MONITOR:   PO intake, Supplement acceptance, Labs, Weight trends, Skin, I & O's  REASON FOR ASSESSMENT:   Rounds    ASSESSMENT:   89 y/o female with h/o CKD III, COPD, HTN, anxiety, dementia, MI and chronic wounds who is admitted with UTI, AMS, sepsis and AKI.  7/28- s/p BSE- NPO  7/29- s/p BSE- dysphagia 1 diet with thin liquids 7/30- transferred out of ICU  Reviewed I/O's: -343 ml x 24 hours and +4.1 L since admission  UOP: 800 ml x 24 hours  Pt lying in bed at time of visit. She did not respond to voice or touch. No family present.   Pt with very minimal oral intake. Noted meal completions 0%. Pt is also refusing supplements.   Palliative care and hospice following. Pt's son revoked hospice at previous agency, but is open to resuming hospice with another agency. Plan to discharge home with hospice.   RD hesitant to recommend alternative means of nutrition/ hydration given pt's hospice status as well as advanced age/ dementia; this would not enhance pt's quality of life. Nothing further to offer from a nutritional standpoint. RD will sign off.   Medications reviewed and include vitamin C , thiamine , and dextrose  5% and  0.45% NaCl infusion @ 40 ml/hr.   Labs reviewed: CBGS: 81-124 (inpatient orders for glycemic control are none).    Diet Order:   Diet Order             DIET - DYS 1 Room service appropriate? Yes with Assist; Fluid consistency: Thin  Diet effective now                   EDUCATION NEEDS:   Not appropriate for education at this time  Skin:  Skin Assessment: Reviewed RN Assessment (DTIs L & R heels, Stage II buttocks) Skin Integrity Issues:: Stage II, Other (Comment), DTI DTI: bilateral heels Stage II: rt/ lt sacrum Other: wound to rt lower buttocks  Last BM:  07/22/24  Height:   Ht Readings from Last 1 Encounters:  07/20/24 5' 9 (1.753 m)    Weight:   Wt Readings from Last 1 Encounters:  07/27/24 74 kg    Ideal Body Weight:  65.9 kg  BMI:  Body mass index is 24.09 kg/m.  Estimated Nutritional Needs:   Kcal:  1400-1600kcal/day  Protein:  70-80g/day  Fluid:  1.3-1.5L/day    Margery ORN, RD, LDN, CDCES Registered Dietitian III Certified Diabetes Care and Education Specialist If unable to reach this RD, please use RD Inpatient group chat on secure chat between hours of 8am-4 pm daily

## 2024-07-27 NOTE — Plan of Care (Signed)

## 2024-07-27 NOTE — TOC Progression Note (Addendum)
 Transition of Care [facility]) - Progression Note    Patient Details  Name: Molly Parks MRN: [id number] Date of Birth: [birth year]  Transition of Care [facility]) CM/SW Contact  Dalia GORMAN Fuse, RN Phone Number: 07/27/2024, 9:06 AM  Clinical Narrative:     TOC received a message advising the patient's son is interested in exploring other [facility] agencies. TOC reached out to the son to offer choice, but was unable to reach him. There was no option to leave a voicemail message.   [facility]  [address], [place], [place] [address]  [phone]  [facility]  [address], [place], [place] [address]  [phone]  [facility]  [address] [place], [place] [address]  [phone]  [facility]  [address], [place], [place] [address]  [phone]  [place]  [phone]   9:25: [facility] received call from patient's son, Kimberlee and provided the list of [facility] agencies above. He will review and callback with choice.      TOC will continue to follow.                Expected Discharge Plan and Services   Discharge Planning Services: CM Consult                                           Social Drivers of Health (SDOH) Interventions SDOH Screenings   Food Insecurity: No Food Insecurity (07/18/2024)  Housing: Low Risk  (07/18/2024)  Transportation Needs: No Transportation Needs (07/18/2024)  Utilities: Not At Risk (07/18/2024)  Social Connections: Patient Declined (07/18/2024)  Tobacco Use: Low Risk  (07/18/2024)    Readmission Risk Interventions     No data to display

## 2024-07-28 DIAGNOSIS — A419 Sepsis, unspecified organism: Secondary | ICD-10-CM | POA: Diagnosis not present

## 2024-07-28 DIAGNOSIS — R4182 Altered mental status, unspecified: Secondary | ICD-10-CM | POA: Diagnosis not present

## 2024-07-28 DIAGNOSIS — E43 Unspecified severe protein-calorie malnutrition: Secondary | ICD-10-CM | POA: Diagnosis not present

## 2024-07-28 DIAGNOSIS — I959 Hypotension, unspecified: Secondary | ICD-10-CM | POA: Diagnosis not present

## 2024-07-28 DIAGNOSIS — R6520 Severe sepsis without septic shock: Secondary | ICD-10-CM | POA: Diagnosis not present

## 2024-07-28 LAB — CBC WITH DIFFERENTIAL/PLATELET
Abs Granulocyte: 6.9 K/uL — ABNORMAL HIGH (ref 1.5–6.5)
Abs Immature Granulocytes: 0.06 K/uL (ref 0.00–0.07)
Basophils Absolute: 0 K/uL (ref 0.0–0.1)
Basophils Relative: 0 %
Eosinophils Absolute: 0 K/uL (ref 0.0–0.5)
Eosinophils Relative: 0 %
HCT: 30.8 % — ABNORMAL LOW (ref 36.0–46.0)
Hemoglobin: 10.4 g/dL — ABNORMAL LOW (ref 12.0–15.0)
Immature Granulocytes: 1 %
Lymphocytes Relative: 19 %
Lymphs Abs: 1.8 K/uL (ref 0.7–4.0)
MCH: 27.7 pg (ref 26.0–34.0)
MCHC: 33.8 g/dL (ref 30.0–36.0)
MCV: 82.1 fL (ref 80.0–100.0)
Monocytes Absolute: 0.8 K/uL (ref 0.1–1.0)
Monocytes Relative: 9 %
Neutro Abs: 6.9 K/uL (ref 1.7–7.7)
Neutrophils Relative %: 71 %
Platelets: 121 K/uL — ABNORMAL LOW (ref 150–400)
RBC: 3.75 MIL/uL — ABNORMAL LOW (ref 3.87–5.11)
RDW: 21.8 % — ABNORMAL HIGH (ref 11.5–15.5)
Smear Review: NORMAL
WBC: 9.6 K/uL (ref 4.0–10.5)
nRBC: 0.2 % (ref 0.0–0.2)

## 2024-07-28 LAB — GLUCOSE, CAPILLARY
Glucose-Capillary: 121 mg/dL — ABNORMAL HIGH (ref 70–99)
Glucose-Capillary: 65 mg/dL — ABNORMAL LOW (ref 70–99)
Glucose-Capillary: 69 mg/dL — ABNORMAL LOW (ref 70–99)
Glucose-Capillary: 70 mg/dL (ref 70–99)
Glucose-Capillary: 74 mg/dL (ref 70–99)
Glucose-Capillary: 79 mg/dL (ref 70–99)
Glucose-Capillary: 89 mg/dL (ref 70–99)
Glucose-Capillary: 94 mg/dL (ref 70–99)

## 2024-07-28 LAB — BASIC METABOLIC PANEL WITH GFR
Anion gap: 6 (ref 5–15)
BUN: 18 mg/dL (ref 8–23)
CO2: 23 mmol/L (ref 22–32)
Calcium: 7.7 mg/dL — ABNORMAL LOW (ref 8.9–10.3)
Chloride: 111 mmol/L (ref 98–111)
Creatinine, Ser: 0.8 mg/dL (ref 0.44–1.00)
GFR, Estimated: >60 mL/min (ref >60)
Glucose, Bld: 97 mg/dL (ref 70–99)
Potassium: 3.3 mmol/L — ABNORMAL LOW (ref 3.5–5.1)
Sodium: 140 mmol/L (ref 135–145)

## 2024-07-28 MED ORDER — DEXTROSE 50 % IV SOLN
1.0000 | INTRAVENOUS | Status: DC | PRN
  Filled 2024-07-28: qty 50

## 2024-07-28 MED ORDER — ENOXAPARIN SODIUM 40 MG/0.4ML IJ SOSY
40.0000 mg | PREFILLED_SYRINGE | INTRAMUSCULAR | Status: DC
  Administered 2024-07-29 – 2024-08-03 (×5): 40 mg via SUBCUTANEOUS
  Filled 2024-07-28 (×7): qty 0.4

## 2024-07-28 NOTE — Plan of Care (Signed)

## 2024-07-28 NOTE — Progress Notes (Signed)
 Palliative Care Progress Note, Assessment & Plan   Patient Name: Molly Parks       Date: 07/28/2024 DOB: [birth year]  Age: 89 y.o. MRN#: [id number] Attending Physician: Marsa Edelman, DO Primary Care Physician: Eliverto Bette Hover, MD Admit Date: 07/17/2024  Subjective: Patient is lying in bed, awake and alert.  She is attempting to tie his sheets and blanket [place].  She acknowledges my presence.  No family or friends present during my visit.  HPI: 89 y.o. female  with past medical history significant for dementia, HTN, COPD, anxiety, sacral ulcer, FTT, CKD stage III, CVA and recurrent Klebsiella UTI. Patient presented to ED 07/17/2024 from home c/o AMS and poor p.o. intake.  On scene, patient was initially unresponsive with BP of 52/30.  Improved response with BP 85/58 after 1 liter fluid.  EMS started Levophed  drip.  Patient was more alert on arrival to ED and was able to state name correctly but unable to provide additional history.   ED vital signs BP 114/70, HR 92, RR 19, SpO2 100% RA, 98.7 F. ED labs significant for Na+ 150, K+ 4.2, glucose 158, BUN 44, creatinine 1.42.  BBC 13.2, platelets 124, lactic acid 3.8, troponin 142.  CXR, CT head, CTA chest, CTAP negative.   Patient was admitted to [facility] for septic shock due to UTI, AMS, AKI on CKD stage III, elevated troponin and FTT.   Initially PMT was consulted but deferred due to patient being actively cared for by [facility] home hospice and was being followed by hospice liaison's during her admission.   PMT was reconsulted 07/25/2024 to clarify goals of care after family wish to pursue full aggressive treatment with all interventions including CPR and intubation and family refusal to continue with current home hospice provider.    Summary of  counseling/coordination of care: Extensive chart review completed prior to meeting patient including labs, vital signs, imaging, progress notes, orders, and available advanced directive documents from current and previous encounters.   After reviewing the patient's chart and assessing the patient at bedside, I spoke with patient and dayshift RN April in regards to symptom management.   Patient is unable to participate in medical decision making or goals of care discussions independently at this time.  She is able to confirm that she is not in any pain or discomfort.  RN endorses that patient has not wanted to eat or drink anything this morning for fear of choking.  Even small sips of Ensure have been offered and patient has declined.  RN shares concern that blood sugars will continue to drop lower.  With no IV access (patient removed multiple IVs over the weekend), option for glucose gel may be needed.  However, patient is refusing to p.o. at this time.  No adjustment to [facility] needed.  However, I am also concerned that patients will become hypoglycemic.  P.o. intake encouraged until complete goals of care discussions can be held with son.  RN advised that something at bedside today.  I shared that I would like to speak with him in person alongside attending Dr. Marsa.  Dr. Marsa shares she is available to speak in person when son is available.  Awaiting son to  present at bedside to continue goals of care discussions.  No change to plan of care at this time.  Physical Exam Vitals reviewed.  Constitutional:      Comments: Thin, frail, temporal wasting  HENT:     Mouth/Throat:     Comments: Missing teeth Eyes:     Pupils: Pupils are equal, round, and reactive to light.  Pulmonary:     Effort: Pulmonary effort is normal.  Abdominal:     Palpations: Abdomen is soft.  Skin:    General: Skin is warm and dry.  Neurological:     Mental Status: She is alert.     Comments: Oriented to self   Psychiatric:        Mood and Affect: Mood normal.             Total Time 25 minutes   Time spent includes: Detailed review of medical records (labs, imaging, vital signs), medically appropriate exam (mental status, respiratory, cardiac, skin), discussed with treatment team, counseling and educating patient, family and staff, documenting clinical information, medication management and coordination of care.  Lamarr L. Arvid, DNP, FNP-BC Palliative Medicine Team

## 2024-07-28 NOTE — Hospital Course (Addendum)
 Hospital course / significant events: 89 y.o female with significant PMH of dementia, HTN, COPD, anxiety, fairly recent development of sacral ulcer following hospitalization 05/2024 for stroke, FTT, CKD stage III, recurrent Klebsiella UTI who presented to the ED 07/17/24 with chief complaints of altered mental status. Of note, largely bedbound following her CVA and on hospice care.   Upon EMS arrival, the patient was initially unresponsive, with a blood pressure reading of 52/30, which improved to 85/58 after administering 1 liter of fluids. The patient was started on a Levophed  drip. Septic shock resolved. Transferred to [facility] as of 7/30. Finished 7 days course abx for UTI.   She was on hospice at home, family has discontinued this / fired Physicist, medical hospice provider. Palliative medicine team was reconsulted 07/25/2024 to clarify goals of care after family wish to pursue full aggressive treatment with all interventions including CPR and intubation and family refusal to continue with current home hospice provider --> 08/04 decision made for DNR no CPR but open to intubation if needed - MOST form completed.   Reduced po intake and intermittent hypoglycemia, we are intermittently administering IV fluids / po intake as desired but patient is declining offered food, declining pills/meds, intermittently pulling IV. Clear with son that pt is not a candidate for feeding tube and is approaching end of life, he wishes to continue to treat the treatable despite advice that treatments will not be likely to alter her trajectory. I have advised that continuing to try to montiot labs and vitals, force medications, etc (especially when patient is pulling IV, refusing to take medications, refusing food and drink) is certainly bothering her more than helping her and if she would want focus on quality of life rather than quantity of time then we should transition to comfort-only care. Son insists she would want to stay  alive no matter what and would want aggressive care/   As of 08/11, treated for pseudomonas in urine (suspect more colonization / asymptomatic bacteriuria vs true UTI but pt cannot describe symptoms) - I have asked ID to weigh in on abx plan - bladder scan post-void 65 mL so low UOP, continue w/ fosfomycin.   [facility] hosp[facility] has accepted patient. 08/03/24 can dc home w/ hospice. Hospitalist has spoken w/ Musician about the above.    Consultants:  ICU Palliative Care   Procedures/Surgeries: none      ASSESSMENT & PLAN:   Septic shock in the setting of UTI, POA Septic shock resolved New pseudomonas UTI vs asymptomatic bacteriuria - pt unable to express if any symptoms  Resolved now and has been off pressors Finished 7-day course of ceftriaxone , now low UOP  UA / culture (+)pseudomonas, tx fosfomycin completed  Son is educated that she is likely to develop UTI / other infections given malnourishment and bedbound    Failure to thrive, debility Complicated by advancing underlying dementia and associated anorexia, complicated by s/p CVA  See below re: GOC discussions Palliative care following  TOC working on another hospice provider acceptance Son is advised that further worsening is expected if she is not eating / advancing dementia and she is expected to decompensate, multiple possible complications including but not limited to infection, electrolyte imbalance, organ failure, etc which will potentially be cause(s) of natural death - please d/w hospice if concerns for symptoms   Hypoglycemia secondary to poor oral intake Patient is not an appropriate candidate for tube feeds - GOC discussions ongoing with family Monitor glucose, dextrose  prn hypoglycemia Son  is advised that hypoglycemia is expected if she is not eating and potential to decompensate due to this - please d/w hospice if concerns for symptoms     Hypokalemia Replace as needed Monitor BMP   Acute  metabolic encephalopathy, POA: in the setting of UTI and complicated by baseline advanced dementia.  Mental status appears to be at baseline now. Delirium precautions    Sacral decubitus ulcer:  Continue wound care as per protocol.   CKD stage 3:  No acute issues now Monitor periodic BMP   Moderate protein calorie malnutrition:  low albumin in the setting of low calorie intake. Dietician on board.   COPD:  Continue with prn inhalational bronchodilator therapy   Goals of care Palliative Care team following No CPR  Artifical feeding has been discussed - son is requesting feeding tube however she is not an appropriate candidate for feeding tube given advanced dementia  07/29/24 spoke w/ son Kimberlee at bedside. Advised that we are approaching a clinical situation where the patient is not eating and everything else we are doing to treat her is just buying limited time. I advised that eventually (I suspect days or at most weeks) her organs will start failing or will have another infection or complication that will be the end. Son wants to try to treat as we are able until the time comes. I asked him what he meant by that, he cannot describe a clear end point. I told him that I as her doctor will let him know when we have tried everything and if she is suffering I will not continue to apply treatments which are not helping her. For now will attempt re-place IV and give some hydration but if this isn't resulting in improved po intake then we should stop IV's, blood draws, etc and let nature take its course while keeping her peaceful. Kimberlee voices agreement to this plan 08/08: son Kimberlee again requests for all available treatment short of CPR. Pt appears comfortable. He and I discussed the certainty that she will continue to decline given malnutrition and cognition, she is high risk for organ failure / infection and when this occurs would recommend transition to comfort measures only treatments. He  states she would want to live, no matter what, even if it's bed bound. Continue current care. He requests for UA, already done given low UOP and abnormal gross appearance urine     Class 2 obesity based on BMI: Body mass index is 35.16 kg/m.[name] Significantly low or high BMI is associated with higher medical risk.  Underweight - under 18  overweight - 25 to 29 obese - 30 or more Class 1 obesity: BMI of 30.0 to 34 Class 2 obesity: BMI of 35.0 to 39 Class 3 obesity: BMI of 40.0 to 49 Super Morbid Obesity: BMI 50-59 Super-super Morbid Obesity: BMI 60+ Healthy nutrition and physical activity advised as adjunct to other disease management and risk reduction treatments       DVT prophylaxis: lovenox  IV fluids: no continuous IV fluids  Nutrition: dysphagia diet Central lines / other devices: none  Code Status: DNR ACP documentation reviewed: MOST and DNR forms are on file in [facility]  [facility] needs: hospice coordination Medical barriers to dispo: none. Expected medical readiness for discharge pending hospice arrangement / ongoing GOC

## 2024-07-28 NOTE — Progress Notes (Signed)
 PROGRESS NOTE    Molly Parks   FMW:[id number] DOB: August 27, 1928  DOA: 07/17/2024 Date of Service: 07/28/24 which is hospital day 11  PCP: Eliverto Bette Hover, MD    Hospital course / significant events: 89 y.o female with significant PMH of dementia, HTN, COPD, anxiety, fairly recent development of sacral ulcer following hospitalization 05/2024 for stroke, FTT, CKD stage III, recurrent Klebsiella UTI who presented to the ED 07/17/24 with chief complaints of altered mental status. Of note, largely bedbound following her CVA and on hospice care. Upon EMS arrival, the patient was initially unresponsive, with a blood pressure reading of 52/30, which improved to 85/58 after administering 1 liter of fluids. The patient was started on a Levophed  drip. Septic shock has resolved now. Transferred to [facility] as of 7/30. Has finished 7 days course abx for UTI. She was on hospice at home, family has discontinued this / fired Physicist, medical hospice provider. Palliative medicine team was reconsulted 07/25/2024 to clarify goals of care after family wish to pursue full aggressive treatment with all interventions including CPR and intubation and family refusal to continue with current home hospice provider --> 08/04 decision made for DNR no CPR but open to intubation if needed - MOST form completed. Reduced po intake and intermittent hypoglycemia, we are intermittently administering IV fluids / po intaks as desired but patient is declining offered food   Consultants:  ICU Palliative Care   Procedures/Surgeries: none      ASSESSMENT & PLAN:   Septic shock in the setting of UTI, POA Resolved now Off of pressors Continue to monitor BP closely. Goal Map of 65 and above Finished 7-day course of ceftriaxone  Continue supplemental IV fluid as patient has reduced oral intake with intermittent hypoglycemia TOC working on another hospice provider acceptance  Hypoglycemia secondary to poor oral  intake dextrose  prn Palliative care discussing with patient's family concerning goals of care and request for tube feeding - patient is not an appropriate candidate for tube feeds  Monitor glucose     Hypokalemia Replace as needed Monitor BMP   Acute metabolic encephalopathy, POA: in the setting of UTI and complicated by baseline advanced dementia.  Mental status appears to be at baseline now. Delirium precautions    Sacral decubitus ulcer:  Continue wound care as per protocol.   CKD stage 3:  No acute issues now Monitor periodic BMP   Moderate protein calorie malnutrition:  low albumin in the setting of low calorie intake. Dietician on board.   COPD:  Continue with prn inhalational bronchodilator therapy   Goals of care Palliative Care team following No CPR  Artifical feeding has been discussed - son is requesting feeding tube however hospice services will not take patients with this in place and at any rate she is not an appropriate candidate for feeding tube Attempted to call son 07/28/24 3:26 PM to discuss, no answer and voicemail was full unable to leave message     Class 2 obesity based on BMI: Body mass index is 35.16 kg/m.[name] Significantly low or high BMI is associated with higher medical risk.  Underweight - under 18  overweight - 25 to 29 obese - 30 or more Class 1 obesity: BMI of 30.0 to 34 Class 2 obesity: BMI of 35.0 to 39 Class 3 obesity: BMI of 40.0 to 49 Super Morbid Obesity: BMI 50-59 Super-super Morbid Obesity: BMI 60+ Healthy nutrition and physical activity advised as adjunct to other disease management and risk reduction treatments  DVT prophylaxis: lovenox  IV fluids: no continuous IV fluids  Nutrition: dysphagia diet Central lines / other devices: none  Code Status: DNR ACP documentation reviewed: MOST and DNR forms are on file in [facility]  [facility] needs: hospice coordination Medical barriers to dispo: none. Expected medical readiness for  discharge pending hospice arrangement / GOC discussion w/ son.              Subjective / Brief ROS:  Patient denies pain She is pleasantly confused, she is talking to me about finding her drivers license so she can go out  Denies CP/SOB.  Denies new weakness.  Tolerating diet but per nursing she is only taking minimal po despite encouragement .   Family Communication: called son today, no answer and voicemail full unable to leave message     Objective Findings:  Vitals:   07/27/24 0814 07/27/24 1720 07/28/24 0400 07/28/24 0457  BP: (!) 103/59 113/67 100/67   Pulse: 83 100 (!) 101   Resp: 20 18    Temp: 98.2 F (36.8 C) (!) 96.2 F (35.7 C) 97.6 F (36.4 C)   TempSrc: Oral  Oral   SpO2: 97% 92% 95%   Weight:    108 kg  Height:        Intake/Output Summary (Last 24 hours) at 07/28/2024 1529 Last data filed at 07/28/2024 0531 Gross per 24 hour  Intake 171.59 ml  Output 150 ml  Net 21.59 ml   Filed Weights   07/25/24 0500 07/27/24 0440 07/28/24 0457  Weight: (!) 204 kg 74 kg 108 kg    Examination:  Physical Exam Constitutional:      General: She is not in acute distress. Cardiovascular:     Rate and Rhythm: Normal rate.  Pulmonary:     Effort: Pulmonary effort is normal.     Breath sounds: Normal breath sounds.  Musculoskeletal:     Right lower leg: No edema.     Left lower leg: No edema.  Neurological:     Mental Status: She is alert. She is disoriented.  Psychiatric:        Mood and Affect: Mood normal.        Behavior: Behavior normal.          Scheduled Medications:   vitamin C   250 mg Oral BID   enoxaparin  (LOVENOX ) injection  40 mg Subcutaneous Q24H   feeding supplement  237 mL Oral BID BM   midodrine   10 mg Oral TID WC   multivitamin with minerals  1 tablet Oral Daily   mouth rinse  15 mL Mouth Rinse 4 times per day    Continuous Infusions:   PRN Medications:  dextrose , docusate sodium , mouth rinse, mouth rinse, polyethylene  glycol  Antimicrobials from admission:  Anti-infectives (From admission, onward)    Start     Dose/Rate Route Frequency Ordered Stop   07/18/24 1000  cefTRIAXone  (ROCEPHIN ) 1 g in sodium chloride  0.9 % 100 mL IVPB  Status:  Discontinued        1 g 200 mL/hr over 30 Minutes Intravenous Every 24 hours 07/18/24 0356 07/26/24 1220   07/17/24 2200  ceFEPIme  (MAXIPIME ) 2 g in sodium chloride  0.9 % 100 mL IVPB        2 g 200 mL/hr over 30 Minutes Intravenous  Once 07/17/24 2147 07/17/24 2254   07/17/24 2200  metroNIDAZOLE  (FLAGYL ) IVPB 500 mg        500 mg 100 mL/hr over 60 Minutes Intravenous  Once  07/17/24 2147 07/17/24 2359   07/17/24 2200  vancomycin  (VANCOCIN ) IVPB 1000 mg/200 mL premix        1,000 mg 200 mL/hr over 60 Minutes Intravenous  Once 07/17/24 2147 07/18/24 0125           Data Reviewed:  I have personally reviewed the following...  CBC: Recent Labs  Lab 07/24/24 0537 07/25/24 0521 07/26/24 0544 07/27/24 0556 07/28/24 0418  WBC 9.7 9.8 10.6* 10.8* 9.6  NEUTROABS 7.0 6.4 7.4 7.8* 6.9  HGB 11.7* 9.9* 10.2* 10.9* 10.4*  HCT 35.2* 29.2* 30.3* 31.4* 30.8*  MCV 82.8 82.0 83.2 80.5 82.1  PLT 98* 90* 87* 78* 121*   Basic Metabolic Panel: Recent Labs  Lab 07/22/24 0538 07/23/24 0447 07/23/24 2224 07/24/24 0537 07/25/24 0521 07/26/24 0544 07/27/24 0556 07/28/24 0418  NA 146* 149* 149* 152* 148* 141 137 140  K 4.1 3.3* 3.3* 3.2* 3.8 3.6 3.7 3.3*  CL 115* 118* 120* 123* 120* 114* 113* 111  CO2 20* 22 20* 23 22 21* 17* 23  GLUCOSE 78 111* 101* 91 110* 111* 78 97  BUN 46* 41* 33* 31* 26* 20 18 18   CREATININE 1.40* 1.12* 0.94 0.95 0.77 0.90 0.88 0.80  CALCIUM  8.0* 7.9* 7.8* 7.8* 7.7* 7.4* 7.7* 7.7*  MG 2.4 2.2 2.2  --   --   --   --   --   PHOS 4.1 3.3  --   --   --   --   --   --    GFR: Estimated Creatinine Clearance: 56.3 mL/min (by C-G formula based on SCr of 0.8 mg/dL). Liver Function Tests: No results for input(s): AST, ALT, ALKPHOS,  BILITOT, PROT, ALBUMIN in the last 168 hours. No results for input(s): LIPASE, AMYLASE in the last 168 hours. No results for input(s): AMMONIA in the last 168 hours. Coagulation Profile: No results for input(s): INR, PROTIME in the last 168 hours. Cardiac Enzymes: No results for input(s): CKTOTAL, CKMB, CKMBINDEX, TROPONINI in the last 168 hours. BNP (last 3 results) No results for input(s): PROBNP in the last 8760 hours. HbA1C: No results for input(s): HGBA1C in the last 72 hours. CBG: Recent Labs  Lab 07/28/24 0005 07/28/24 0140 07/28/24 0357 07/28/24 0735 07/28/24 1213  GLUCAP 70 94 121* 74 89   Lipid Profile: No results for input(s): CHOL, HDL, LDLCALC, TRIG, CHOLHDL, LDLDIRECT in the last 72 hours. Thyroid Function Tests: No results for input(s): TSH, T4TOTAL, FREET4, T3FREE, THYROIDAB in the last 72 hours. Anemia Panel: No results for input(s): VITAMINB12, FOLATE, FERRITIN, TIBC, IRON, RETICCTPCT in the last 72 hours. Most Recent Urinalysis On File:     Component Value Date/Time   COLORURINE YELLOW (A) 07/18/2024 1530   APPEARANCEUR CLOUDY (A) 07/18/2024 1530   APPEARANCEUR Hazy 11/14/2012 2046   LABSPEC 1.011 07/18/2024 1530   LABSPEC 1.021 11/14/2012 2046   PHURINE 5.0 07/18/2024 1530   GLUCOSEU NEGATIVE 07/18/2024 1530   GLUCOSEU Negative 11/14/2012 2046   HGBUR SMALL (A) 07/18/2024 1530   BILIRUBINUR NEGATIVE 07/18/2024 1530   BILIRUBINUR Negative 11/14/2012 2046   KETONESUR 5 (A) 07/18/2024 1530   PROTEINUR 30 (A) 07/18/2024 1530   NITRITE NEGATIVE 07/18/2024 1530   LEUKOCYTESUR TRACE (A) 07/18/2024 1530   LEUKOCYTESUR Negative 11/14/2012 2046   Sepsis Labs: @LABRCNTIP (procalcitonin:4,lacticidven:4) Microbiology: Recent Results (from the past 240 hours)  Urine Culture     Status: Abnormal   Collection Time: 07/18/24  3:30 PM   Specimen: Urine, Random  Result Value Ref Range  Status    Specimen Description   Final    URINE, RANDOM Performed at [facility], [address] [place], [place] [address]    Special Requests   Final    NONE Reflexed from [id number] Performed at [facility], [address] [place], [place] [address]    Culture >=100,000 COLONIES/mL YEAST (A)  Final   Report Status 07/20/2024 FINAL  Final  Blood Culture (routine x 2)     Status: None   Collection Time: 07/18/24  4:35 PM   Specimen: BLOOD  Result Value Ref Range Status   Specimen Description BLOOD BLOOD LEFT ARM  Final   Special Requests   Final    BOTTLES DRAWN AEROBIC AND ANAEROBIC Blood Culture results may not be optimal due to an inadequate volume of blood received in culture bottles   Culture   Final    NO GROWTH 5 DAYS Performed at [facility], [address] [place], [place] [address]    Report Status 07/23/2024 FINAL  Final  Blood Culture (routine x 2)     Status: None   Collection Time: 07/18/24  4:54 PM   Specimen: BLOOD LEFT HAND  Result Value Ref Range Status   Specimen Description BLOOD LEFT HAND  Final   Special Requests   Final    BOTTLES DRAWN AEROBIC AND ANAEROBIC Blood Culture adequate volume   Culture   Final    NO GROWTH 5 DAYS Performed at [facility], [address], [place], [place] [address]    Report Status 07/23/2024 FINAL  Final      Radiology Studies last 3 days: No results found.      Jakye Mullens, DO [facility] 07/28/2024, 3:29 PM    Dictation software may have been used to generate the above note. Typos may occur and escape review in typed/dictated notes. Please contact Dr Marsa directly for clarity if needed.  Staff may message me via secure chat in Epic  but this may not receive an immediate response,  please page me for urgent matters!  If 7PM-7AM, please contact night coverage [email or web address]

## 2024-07-29 DIAGNOSIS — E43 Unspecified severe protein-calorie malnutrition: Secondary | ICD-10-CM | POA: Diagnosis not present

## 2024-07-29 DIAGNOSIS — R6520 Severe sepsis without septic shock: Secondary | ICD-10-CM | POA: Diagnosis not present

## 2024-07-29 DIAGNOSIS — A419 Sepsis, unspecified organism: Secondary | ICD-10-CM | POA: Diagnosis not present

## 2024-07-29 DIAGNOSIS — I959 Hypotension, unspecified: Secondary | ICD-10-CM | POA: Diagnosis not present

## 2024-07-29 DIAGNOSIS — R4182 Altered mental status, unspecified: Secondary | ICD-10-CM | POA: Diagnosis not present

## 2024-07-29 LAB — CBC
HCT: 31.2 % — ABNORMAL LOW (ref 36.0–46.0)
Hemoglobin: 10.6 g/dL — ABNORMAL LOW (ref 12.0–15.0)
MCH: 27.7 pg (ref 26.0–34.0)
MCHC: 34 g/dL (ref 30.0–36.0)
MCV: 81.7 fL (ref 80.0–100.0)
Platelets: 119 K/uL — ABNORMAL LOW (ref 150–400)
RBC: 3.82 MIL/uL — ABNORMAL LOW (ref 3.87–5.11)
RDW: 21.5 % — ABNORMAL HIGH (ref 11.5–15.5)
WBC: 9.9 K/uL (ref 4.0–10.5)
nRBC: 0 % (ref 0.0–0.2)

## 2024-07-29 LAB — BASIC METABOLIC PANEL WITH GFR
Anion gap: 11 (ref 5–15)
BUN: 17 mg/dL (ref 8–23)
CO2: 21 mmol/L — ABNORMAL LOW (ref 22–32)
Calcium: 8.1 mg/dL — ABNORMAL LOW (ref 8.9–10.3)
Chloride: 110 mmol/L (ref 98–111)
Creatinine, Ser: 0.88 mg/dL (ref 0.44–1.00)
GFR, Estimated: >60 mL/min (ref >60)
Glucose, Bld: 69 mg/dL — ABNORMAL LOW (ref 70–99)
Potassium: 3.5 mmol/L (ref 3.5–5.1)
Sodium: 142 mmol/L (ref 135–145)

## 2024-07-29 LAB — GLUCOSE, CAPILLARY
Glucose-Capillary: 115 mg/dL — ABNORMAL HIGH (ref 70–99)
Glucose-Capillary: 150 mg/dL — ABNORMAL HIGH (ref 70–99)
Glucose-Capillary: 64 mg/dL — ABNORMAL LOW (ref 70–99)
Glucose-Capillary: 72 mg/dL (ref 70–99)
Glucose-Capillary: 74 mg/dL (ref 70–99)
Glucose-Capillary: 77 mg/dL (ref 70–99)

## 2024-07-29 LAB — MAGNESIUM: Magnesium: 1.5 mg/dL — ABNORMAL LOW (ref 1.7–2.4)

## 2024-07-29 MED ORDER — GLUCOSE 40 % PO GEL
ORAL | Status: AC
  Filled 2024-07-29: qty 1

## 2024-07-29 MED ORDER — DEXTROSE 50 % IV SOLN: 1.0000 | INTRAVENOUS | Status: DC | PRN

## 2024-07-29 MED ORDER — DEXTROSE IN LACTATED RINGERS 5 % IV SOLN: INTRAVENOUS | Status: AC

## 2024-07-29 NOTE — Progress Notes (Addendum)
 PROGRESS NOTE    Molly Parks   FMW:[id number] DOB: 1928-02-28  DOA: 07/17/2024 Date of Service: 07/29/24 which is hospital day 12  PCP: Eliverto Bette Hover, MD    Hospital course / significant events: 89 y.o female with significant PMH of dementia, HTN, COPD, anxiety, fairly recent development of sacral ulcer following hospitalization 05/2024 for stroke, FTT, CKD stage III, recurrent Klebsiella UTI who presented to the ED 07/17/24 with chief complaints of altered mental status. Of note, largely bedbound following her CVA and on hospice care. Upon EMS arrival, the patient was initially unresponsive, with a blood pressure reading of 52/30, which improved to 85/58 after administering 1 liter of fluids. The patient was started on a Levophed  drip. Septic shock has resolved now. Transferred to [facility] as of 7/30. Has finished 7 days course abx for UTI. She was on hospice at home, family has discontinued this / fired Physicist, medical hospice provider. Palliative medicine team was reconsulted 07/25/2024 to clarify goals of care after family wish to pursue full aggressive treatment with all interventions including CPR and intubation and family refusal to continue with current home hospice provider --> 08/04 decision made for DNR no CPR but open to intubation if needed - MOST form completed. Reduced po intake and intermittent hypoglycemia, we are intermittently administering IV fluids / po intake as desired but patient is declining offered food   Consultants:  ICU Palliative Care   Procedures/Surgeries: none      ASSESSMENT & PLAN:   Septic shock in the setting of UTI, POA Resolved now and has been off pressors Finished 7-day course of ceftriaxone   Failure to thrive, debility Complicated by advancing underlying dementia and associated anorexia, complicated by s/p CVA  See below re: GOC discussions Palliative care following  TOC working on another hospice provider acceptance  Hypoglycemia  secondary to poor oral intake Patient is not an appropriate candidate for tube feeds - GOC discussions ongoing with family Monitor glucose, dextrose  prn hypoglycemia    Hypokalemia Replace as needed Monitor BMP   Acute metabolic encephalopathy, POA: in the setting of UTI and complicated by baseline advanced dementia.  Mental status appears to be at baseline now. Delirium precautions    Sacral decubitus ulcer:  Continue wound care as per protocol.   CKD stage 3:  No acute issues now Monitor periodic BMP   Moderate protein calorie malnutrition:  low albumin in the setting of low calorie intake. Dietician on board.   COPD:  Continue with prn inhalational bronchodilator therapy   Goals of care Palliative Care team following No CPR  Artifical feeding has been discussed - son is requesting feeding tube however she is not an appropriate candidate for feeding tube given advanced dementia  Hospitalist attempted to call son 07/28/24 3:26 PM to discuss, no answer and voicemail was full unable to leave message. Palliative care spoke w/ son briefly 07/29/24 and he plans on being at the hospital but he ended call before other details could be confirmed. RN to alert hospitalist and palliative team if/when he arrives at bedside  [facility] 07/29/24 3:48 PM spoke w/ son at bedside. Advised that we are approaching a clinical situation where the patient is not eating and everything else we are doing to treat her is just buying limited time. I advised that eventually (I suspect days or weeks) her organs will start failing or will have another infection or complication that will be the end. Son wants tot ry to treat as we are able  until the time comes. I asked him what he meant by that, unclear end point. I told him that I as her doctor will let him know when we have tried everything and if she is suffering I will not continue to apply treatments which are not helping her. For now will attempt re-place  IV and give some hydration but if this isn't resulting in improved po intake then we should stop IV's, blood draws, etc and let nature take its course while keeping her peaceful. Glen voices agreement to this plan. Time spent on advanced care planning 30 min.     Class 2 obesity based on BMI: Body mass index is 35.16 kg/m.[name] Significantly low or high BMI is associated with higher medical risk.  Underweight - under 18  overweight - 25 to 29 obese - 30 or more Class 1 obesity: BMI of 30.0 to 34 Class 2 obesity: BMI of 35.0 to 39 Class 3 obesity: BMI of 40.0 to 49 Super Morbid Obesity: BMI 50-59 Super-super Morbid Obesity: BMI 60+ Healthy nutrition and physical activity advised as adjunct to other disease management and risk reduction treatments    DVT prophylaxis: lovenox  IV fluids: no continuous IV fluids  Nutrition: dysphagia diet Central lines / other devices: none  Code Status: DNR ACP documentation reviewed: MOST and DNR forms are on file in [facility]  [facility] needs: hospice coordination Medical barriers to dispo: none. Expected medical readiness for discharge pending hospice arrangement / GOC discussion w/ son but he has not been consistently available.              Subjective / Brief ROS:  Patient denies pain She is pleasantly confused No concerns from RN other than still not eating much, pulled her IV  .   Family Communication: bedside w/ son today see above    Objective Findings:  Vitals:   07/29/24 0032 07/29/24 0425 07/29/24 0833 07/29/24 1500  BP: 131/86 (!) 123/104 137/79 135/77  Pulse: (!) 116 (!) 102 (!) 111 93  Resp:   18 18  Temp:  97.6 F (36.4 C) 98.1 F (36.7 C) 98.2 F (36.8 C)  TempSrc:      SpO2: (!) 71% 98% 98% 98%  Weight:      Height:        Intake/Output Summary (Last 24 hours) at 07/29/2024 1552 Last data filed at 07/29/2024 1300 Gross per 24 hour  Intake 0 ml  Output --  Net 0 ml   Filed Weights   07/27/24 0440 07/28/24 0457  07/28/24 1500  Weight: 74 kg 108 kg 89 kg    Examination:  Physical Exam Constitutional:      General: She is not in acute distress. Cardiovascular:     Rate and Rhythm: Normal rate.  Pulmonary:     Effort: Pulmonary effort is normal.     Breath sounds: Normal breath sounds.  Musculoskeletal:     Right lower leg: No edema.     Left lower leg: No edema.  Neurological:     Mental Status: She is alert. She is disoriented.  Psychiatric:        Mood and Affect: Mood normal.        Behavior: Behavior normal.          Scheduled Medications:   vitamin C   250 mg Oral BID   enoxaparin  (LOVENOX ) injection  40 mg Subcutaneous Q24H   feeding supplement  237 mL Oral BID BM   midodrine   10 mg Oral TID  WC   multivitamin with minerals  1 tablet Oral Daily   mouth rinse  15 mL Mouth Rinse 4 times per day    Continuous Infusions:   PRN Medications:  dextrose , docusate sodium , mouth rinse, mouth rinse, polyethylene glycol  Antimicrobials from admission:  Anti-infectives (From admission, onward)    Start     Dose/Rate Route Frequency Ordered Stop   07/18/24 1000  cefTRIAXone  (ROCEPHIN ) 1 g in sodium chloride  0.9 % 100 mL IVPB  Status:  Discontinued        1 g 200 mL/hr over 30 Minutes Intravenous Every 24 hours 07/18/24 0356 07/26/24 1220   07/17/24 2200  ceFEPIme  (MAXIPIME ) 2 g in sodium chloride  0.9 % 100 mL IVPB        2 g 200 mL/hr over 30 Minutes Intravenous  Once 07/17/24 2147 07/17/24 2254   07/17/24 2200  metroNIDAZOLE  (FLAGYL ) IVPB 500 mg        500 mg 100 mL/hr over 60 Minutes Intravenous  Once 07/17/24 2147 07/17/24 2359   07/17/24 2200  vancomycin  (VANCOCIN ) IVPB 1000 mg/200 mL premix        1,000 mg 200 mL/hr over 60 Minutes Intravenous  Once 07/17/24 2147 07/18/24 0125           Data Reviewed:  I have personally reviewed the following...  CBC: Recent Labs  Lab 07/24/24 0537 07/25/24 0521 07/26/24 0544 07/27/24 0556 07/28/24 0418 07/29/24 0351   WBC 9.7 9.8 10.6* 10.8* 9.6 9.9  NEUTROABS 7.0 6.4 7.4 7.8* 6.9  --   HGB 11.7* 9.9* 10.2* 10.9* 10.4* 10.6*  HCT 35.2* 29.2* 30.3* 31.4* 30.8* 31.2*  MCV 82.8 82.0 83.2 80.5 82.1 81.7  PLT 98* 90* 87* 78* 121* 119*   Basic Metabolic Panel: Recent Labs  Lab 07/23/24 0447 07/23/24 2224 07/24/24 0537 07/25/24 0521 07/26/24 0544 07/27/24 0556 07/28/24 0418 07/29/24 0351  NA 149* 149*   < > 148* 141 137 140 142  K 3.3* 3.3*   < > 3.8 3.6 3.7 3.3* 3.5  CL 118* 120*   < > 120* 114* 113* 111 110  CO2 22 20*   < > 22 21* 17* 23 21*  GLUCOSE 111* 101*   < > 110* 111* 78 97 69*  BUN 41* 33*   < > 26* 20 18 18 17   CREATININE 1.12* 0.94   < > 0.77 0.90 0.88 0.80 0.88  CALCIUM  7.9* 7.8*   < > 7.7* 7.4* 7.7* 7.7* 8.1*  MG 2.2 2.2  --   --   --   --   --  1.5*  PHOS 3.3  --   --   --   --   --   --   --    < > = values in this interval not displayed.   GFR: Estimated Creatinine Clearance: 46.5 mL/min (by C-G formula based on SCr of 0.88 mg/dL). Liver Function Tests: No results for input(s): AST, ALT, ALKPHOS, BILITOT, PROT, ALBUMIN in the last 168 hours. No results for input(s): LIPASE, AMYLASE in the last 168 hours. No results for input(s): AMMONIA in the last 168 hours. Coagulation Profile: No results for input(s): INR, PROTIME in the last 168 hours. Cardiac Enzymes: No results for input(s): CKTOTAL, CKMB, CKMBINDEX, TROPONINI in the last 168 hours. BNP (last 3 results) No results for input(s): PROBNP in the last 8760 hours. HbA1C: No results for input(s): HGBA1C in the last 72 hours. CBG: Recent Labs  Lab 07/28/24 2029 07/29/24 0028  07/29/24 0808 07/29/24 0912 07/29/24 1148  GLUCAP 69* 74 64* 72 150*   Lipid Profile: No results for input(s): CHOL, HDL, LDLCALC, TRIG, CHOLHDL, LDLDIRECT in the last 72 hours. Thyroid Function Tests: No results for input(s): TSH, T4TOTAL, FREET4, T3FREE, THYROIDAB in the last 72  hours. Anemia Panel: No results for input(s): VITAMINB12, FOLATE, FERRITIN, TIBC, IRON, RETICCTPCT in the last 72 hours. Most Recent Urinalysis On File:     Component Value Date/Time   COLORURINE YELLOW (A) 07/18/2024 1530   APPEARANCEUR CLOUDY (A) 07/18/2024 1530   APPEARANCEUR Hazy 11/14/2012 2046   LABSPEC 1.011 07/18/2024 1530   LABSPEC 1.021 11/14/2012 2046   PHURINE 5.0 07/18/2024 1530   GLUCOSEU NEGATIVE 07/18/2024 1530   GLUCOSEU Negative 11/14/2012 2046   HGBUR SMALL (A) 07/18/2024 1530   BILIRUBINUR NEGATIVE 07/18/2024 1530   BILIRUBINUR Negative 11/14/2012 2046   KETONESUR 5 (A) 07/18/2024 1530   PROTEINUR 30 (A) 07/18/2024 1530   NITRITE NEGATIVE 07/18/2024 1530   LEUKOCYTESUR TRACE (A) 07/18/2024 1530   LEUKOCYTESUR Negative 11/14/2012 2046   Sepsis Labs: @LABRCNTIP (procalcitonin:4,lacticidven:4) Microbiology: No results found for this or any previous visit (from the past 240 hours).     Radiology Studies last 3 days: No results found.      Ana Woodroof, DO [facility] 07/29/2024, 3:52 PM    Dictation software may have been used to generate the above note. Typos may occur and escape review in typed/dictated notes. Please contact Dr Marsa directly for clarity if needed.  Staff may message me via secure chat in Epic  but this may not receive an immediate response,  please page me for urgent matters!  If 7PM-7AM, please contact night coverage [email or web address]

## 2024-07-29 NOTE — Progress Notes (Signed)
 Palliative Care Progress Note, Assessment & Plan   Patient Name: Molly Parks       Date: 07/29/2024 DOB: 05-16-1928  Age: 89 y.o. MRN#: [id number] Attending Physician: Marsa Edelman, DO Primary Care Physician: Eliverto Bette Hover, MD Admit Date: 07/17/2024  Subjective: Patient is lying in bed, resting, in no apparent distress.  She easily awakens to my light physical touch but returns to sleep.  No family or friends present during my visit.  HPI: 89 y.o. female  with past medical history significant for dementia, HTN, COPD, anxiety, sacral ulcer, FTT, CKD stage III, CVA and recurrent Klebsiella UTI. Patient presented to ED 07/17/2024 from home c/o AMS and poor p.o. intake.  On scene, patient was initially unresponsive with BP of 52/30.  Improved response with BP 85/58 after 1 liter fluid.  EMS started Levophed  drip.  Patient was more alert on arrival to ED and was able to state name correctly but unable to provide additional history.   ED vital signs BP 114/70, HR 92, RR 19, SpO2 100% RA, 98.7 F. ED labs significant for Na+ 150, K+ 4.2, glucose 158, BUN 44, creatinine 1.42.  BBC 13.2, platelets 124, lactic acid 3.8, troponin 142.  CXR, CT head, CTA chest, CTAP negative.   Patient was admitted to [facility] for septic shock due to UTI, AMS, AKI on CKD stage III, elevated troponin and FTT.   Initially PMT was consulted but deferred due to patient being actively cared for by [facility] home hospice and was being followed by hospice liaison's during her admission.   PMT was reconsulted 07/25/2024 to clarify goals of care after family wish to pursue full aggressive treatment with all interventions including CPR and intubation and family refusal to continue with current home hospice provider.  Summary of  counseling/coordination of care: Extensive chart review completed prior to meeting patient including labs, vital signs, imaging, progress notes, orders, and available advanced directive documents from current and previous encounters.   After reviewing the patient's chart and assessing the patient at bedside, patient remains unable to participate in goals of care or medical decision making independently at this time.  Symptoms assessed.  No signs of distress, frail furling, grimacing, moaning, or other nonverbal signs of pain or discomfort noted.  No adjustment to [facility] needed at this time.  After assessing patient, spoke with patient's son [name] over the phone.  He shares he plans to be at the hospital today.  The conversation ended prior to me being able to get the sense of a timeframe of when he will be bedside.  Requested from RN that PMT is contacted when son is at bedside so that goals of care discussions can be continued.  Awaiting arrival of son at bedside to continue discussions.  Physical Exam Vitals reviewed.  Constitutional:      Comments: Thin, frail, elderly  HENT:     Head:     Comments: Temporal wasting    Mouth/Throat:     Mouth: Mucous membranes are moist.  Cardiovascular:     Pulses: Normal pulses.  Pulmonary:     Effort: Pulmonary effort is normal.  Abdominal:     Palpations: Abdomen is soft.  Skin:  General: Skin is warm and dry.  Neurological:     Mental Status: She is alert.  Psychiatric:        Mood and Affect: Mood normal.        Behavior: Behavior normal.        Judgment: Judgment normal.             Total Time 25 minutes   Time spent includes: Detailed review of medical records (labs, imaging, vital signs), medically appropriate exam (mental status, respiratory, cardiac, skin), discussed with treatment team, counseling and educating patient, family and staff, documenting clinical information, medication management and coordination of care.  Lamarr  L. Arvid, DNP, FNP-BC Palliative Medicine Team

## 2024-07-29 NOTE — Plan of Care (Signed)
  Problem: Clinical Measurements: Goal: Ability to maintain clinical measurements within normal limits will improve Outcome: Progressing Goal: Will remain free from infection Outcome: Progressing Goal: Respiratory complications will improve Outcome: Progressing Goal: Cardiovascular complication will be avoided Outcome: Progressing   Problem: Elimination: Goal: Will not experience complications related to bowel motility Outcome: Progressing   Problem: Education: Goal: Knowledge of General Education information will improve Description: Including pain rating scale, medication(s)/side effects and non-pharmacologic comfort measures Outcome: Not Progressing

## 2024-07-29 NOTE — Plan of Care (Signed)
  Problem: Clinical Measurements: Goal: Will remain free from infection Outcome: Progressing Goal: Diagnostic test results will improve Outcome: Progressing Goal: Respiratory complications will improve Outcome: Progressing Goal: Cardiovascular complication will be avoided Outcome: Progressing   Problem: Activity: Goal: Risk for activity intolerance will decrease Outcome: Progressing   Problem: Coping: Goal: Level of anxiety will decrease Outcome: Progressing   Problem: Safety: Goal: Ability to remain free from injury will improve Outcome: Progressing

## 2024-07-30 DIAGNOSIS — E43 Unspecified severe protein-calorie malnutrition: Secondary | ICD-10-CM | POA: Diagnosis not present

## 2024-07-30 DIAGNOSIS — Z515 Encounter for palliative care: Secondary | ICD-10-CM | POA: Diagnosis not present

## 2024-07-30 DIAGNOSIS — A419 Sepsis, unspecified organism: Secondary | ICD-10-CM | POA: Diagnosis not present

## 2024-07-30 DIAGNOSIS — R6520 Severe sepsis without septic shock: Secondary | ICD-10-CM | POA: Diagnosis not present

## 2024-07-30 DIAGNOSIS — I959 Hypotension, unspecified: Secondary | ICD-10-CM | POA: Diagnosis not present

## 2024-07-30 LAB — BASIC METABOLIC PANEL WITH GFR
Anion gap: 8 (ref 5–15)
BUN: 14 mg/dL (ref 8–23)
CO2: 21 mmol/L — ABNORMAL LOW (ref 22–32)
Calcium: 7.5 mg/dL — ABNORMAL LOW (ref 8.9–10.3)
Chloride: 114 mmol/L — ABNORMAL HIGH (ref 98–111)
Creatinine, Ser: 0.67 mg/dL (ref 0.44–1.00)
GFR, Estimated: >60 mL/min (ref >60)
Glucose, Bld: 96 mg/dL (ref 70–99)
Potassium: 3.1 mmol/L — ABNORMAL LOW (ref 3.5–5.1)
Sodium: 143 mmol/L (ref 135–145)

## 2024-07-30 LAB — GLUCOSE, CAPILLARY
Glucose-Capillary: 103 mg/dL — ABNORMAL HIGH (ref 70–99)
Glucose-Capillary: 105 mg/dL — ABNORMAL HIGH (ref 70–99)
Glucose-Capillary: 144 mg/dL — ABNORMAL HIGH (ref 70–99)
Glucose-Capillary: 95 mg/dL (ref 70–99)
Glucose-Capillary: 97 mg/dL (ref 70–99)
Glucose-Capillary: 98 mg/dL (ref 70–99)

## 2024-07-30 LAB — CBC
HCT: 28.2 % — ABNORMAL LOW (ref 36.0–46.0)
Hemoglobin: 9.2 g/dL — ABNORMAL LOW (ref 12.0–15.0)
MCH: 28 pg (ref 26.0–34.0)
MCHC: 32.6 g/dL (ref 30.0–36.0)
MCV: 86 fL (ref 80.0–100.0)
Platelets: 119 K/uL — ABNORMAL LOW (ref 150–400)
RBC: 3.28 MIL/uL — ABNORMAL LOW (ref 3.87–5.11)
RDW: 22 % — ABNORMAL HIGH (ref 11.5–15.5)
WBC: 9.6 K/uL (ref 4.0–10.5)
nRBC: 0 % (ref 0.0–0.2)

## 2024-07-30 LAB — MAGNESIUM: Magnesium: 1.6 mg/dL — ABNORMAL LOW (ref 1.7–2.4)

## 2024-07-30 MED ORDER — DEXTROSE IN LACTATED RINGERS 5 % IV SOLN: INTRAVENOUS | Status: AC

## 2024-07-30 MED ORDER — POTASSIUM CHLORIDE 10 MEQ/100ML IV SOLN
10.0000 meq | INTRAVENOUS | Status: AC
  Administered 2024-07-30 (×2): 10 meq via INTRAVENOUS
  Filled 2024-07-30: qty 100

## 2024-07-30 NOTE — Progress Notes (Signed)
 Palliative Care Progress Note, Assessment & Plan   Patient Name: Molly Parks       Date: 07/30/2024 DOB: Mar 20, 1928  Age: 89 y.o. MRN#: [id number] Attending Physician: Marsa Edelman, DO Primary Care Physician: Eliverto Bette Hover, MD Admit Date: 07/17/2024  Subjective: Patient is lying in, asleep, but easily awakens to my presence.  She makes eye contact with me and shares she needs to pee.  No family or friends present during my visit.  HPI: 89 y.o. female  with past medical history significant for dementia, HTN, COPD, anxiety, sacral ulcer, FTT, CKD stage III, CVA and recurrent Klebsiella UTI. Patient presented to ED 07/17/2024 from home c/o AMS and poor p.o. intake.  On scene, patient was initially unresponsive with BP of 52/30.  Improved response with BP 85/58 after 1 liter fluid.  EMS started Levophed  drip.  Patient was more alert on arrival to ED and was able to state name correctly but unable to provide additional history.   ED vital signs BP 114/70, HR 92, RR 19, SpO2 100% RA, 98.7 F. ED labs significant for Na+ 150, K+ 4.2, glucose 158, BUN 44, creatinine 1.42.  BBC 13.2, platelets 124, lactic acid 3.8, troponin 142.  CXR, CT head, CTA chest, CTAP negative.   Patient was admitted to [facility] for septic shock due to UTI, AMS, AKI on CKD stage III, elevated troponin and FTT.   Initially PMT was consulted but deferred due to patient being actively cared for by [facility] home hospice and was being followed by hospice liaison's during her admission.   PMT was reconsulted 07/25/2024 to clarify goals of care after family wish to pursue full aggressive treatment with all interventions including CPR and intubation and family refusal to continue with current home hospice provider.  Summary of  counseling/coordination of care: Extensive chart review completed prior to meeting patient including labs, vital signs, imaging, progress notes, orders, and available advanced directive documents from current and previous encounters.   After reviewing the patient's chart and assessing the patient at bedside, I spoke with patient in regards to symptom management and goals of care.   Advised patient that she has a PureWick in place.  She did not understand.  Repeated efforts made to explain that patient can urinate without fear of soiling the entire bed.  I attempted to assess patient's pain and symptoms but she stayed focused on needing to make urine.  Patient remains unable to participate in goals of care and medical decision making at this time.  Symptoms appear appropriately managed.  Remains to continue IV fluids for signs of improvement.  I am off service until Monday.  I will ask a colleague to shadow and monitor the patient peripherally.  I plan to follow-up with patient on Monday.  Physical Exam Vitals reviewed.  Constitutional:      Comments: Thin, frail  HENT:     Head:     Comments: Temporal wasting    Mouth/Throat:     Mouth: Mucous membranes are moist.  Eyes:     Pupils: Pupils are equal, round, and reactive to light.  Pulmonary:     Effort: Pulmonary effort is normal.  Abdominal:  Palpations: Abdomen is soft.  Musculoskeletal:     Comments: Generalized weakness  Neurological:     Mental Status: She is alert.     Comments: Oriented to self             Total Time 25 minutes   Time spent includes: Detailed review of medical records (labs, imaging, vital signs), medically appropriate exam (mental status, respiratory, cardiac, skin), discussed with treatment team, counseling and educating patient, family and staff, documenting clinical information, medication management and coordination of care.  Lamarr L. Arvid, DNP, FNP-BC Palliative Medicine Team

## 2024-07-30 NOTE — TOC Progression Note (Signed)
 Transition of Care [facility]) - Progression Note    Patient Details  Name: NEERA TENG MRN: [id number] Date of Birth: 1928/06/04  Transition [facility] CM/SW Contact  Dalia GORMAN Fuse, RN Phone Number: 07/30/2024, 1:56 PM  Clinical Narrative:    WONDA outreached to the patient's son Kimberlee to discuss discharge plan. He has not had an opportunity to review the list of [facility] hospice agencies, he is busy right now and unable to discuss. He plans to review and will call [facility] tomorrow and leave a message with his decision.  TOC will continue to follow.                    Expected Discharge Plan and Services   Discharge Planning Services: CM Consult                                           Social Drivers of Health (SDOH) Interventions SDOH Screenings   Food Insecurity: No Food Insecurity (07/18/2024)  Housing: Low Risk  (07/18/2024)  Transportation Needs: No Transportation Needs (07/18/2024)  Utilities: Not At Risk (07/18/2024)  Social Connections: Patient Declined (07/18/2024)  Tobacco Use: Low Risk  (07/18/2024)    Readmission Risk Interventions     No data to display

## 2024-07-30 NOTE — Progress Notes (Signed)
 PROGRESS NOTE    Molly Parks   FMW:[id number] DOB: Aug 22, 1928  DOA: 07/17/2024 Date of Service: 07/30/24 which is hospital day 13  PCP: Eliverto Bette Hover, MD    Hospital course / significant events: 89 y.o female with significant PMH of dementia, HTN, COPD, anxiety, fairly recent development of sacral ulcer following hospitalization 05/2024 for stroke, FTT, CKD stage III, recurrent Klebsiella UTI who presented to the ED 07/17/24 with chief complaints of altered mental status. Of note, largely bedbound following her CVA and on hospice care. Upon EMS arrival, the patient was initially unresponsive, with a blood pressure reading of 52/30, which improved to 85/58 after administering 1 liter of fluids. The patient was started on a Levophed  drip. Septic shock has resolved now. Transferred to [facility] as of 7/30. Has finished 7 days course abx for UTI. She was on hospice at home, family has discontinued this / fired Physicist, medical hospice provider. Palliative medicine team was reconsulted 07/25/2024 to clarify goals of care after family wish to pursue full aggressive treatment with all interventions including CPR and intubation and family refusal to continue with current home hospice provider --> 08/04 decision made for DNR no CPR but open to intubation if needed - MOST form completed. Reduced po intake and intermittent hypoglycemia, we are intermittently administering IV fluids / po intake as desired but patient is declining offered food, declining pills/meds, intermittently pulling IV. Clear with son that pt is not a candidate for feeding tube and is approaching end of life, he wishes to continue to treat the treatable as long as she is comfortable    Consultants:  ICU Palliative Care   Procedures/Surgeries: none      ASSESSMENT & PLAN:   Septic shock in the setting of UTI, POA Resolved now and has been off pressors Finished 7-day course of ceftriaxone   Failure to thrive,  debility Complicated by advancing underlying dementia and associated anorexia, complicated by s/p CVA  See below re: GOC discussions Palliative care following  TOC working on another hospice provider acceptance  Hypoglycemia secondary to poor oral intake Patient is not an appropriate candidate for tube feeds - GOC discussions ongoing with family Monitor glucose, dextrose  prn hypoglycemia    Hypokalemia Replace as needed Monitor BMP   Acute metabolic encephalopathy, POA: in the setting of UTI and complicated by baseline advanced dementia.  Mental status appears to be at baseline now. Delirium precautions    Sacral decubitus ulcer:  Continue wound care as per protocol.   CKD stage 3:  No acute issues now Monitor periodic BMP   Moderate protein calorie malnutrition:  low albumin in the setting of low calorie intake. Dietician on board.   COPD:  Continue with prn inhalational bronchodilator therapy   Goals of care Palliative Care team following No CPR  Artifical feeding has been discussed - son is requesting feeding tube however she is not an appropriate candidate for feeding tube given advanced dementia  07/29/24 spoke w/ son Kimberlee at bedside. Advised that we are approaching a clinical situation where the patient is not eating and everything else we are doing to treat her is just buying limited time. I advised that eventually (I suspect days or at most weeks) her organs will start failing or will have another infection or complication that will be the end. Son wants to try to treat as we are able until the time comes. I asked him what he meant by that, he cannot describe a clear end point. I  told him that I as her doctor will let him know when we have tried everything and if she is suffering I will not continue to apply treatments which are not helping her. For now will attempt re-place IV and give some hydration but if this isn't resulting in improved po intake then we should  stop IV's, blood draws, etc and let nature take its course while keeping her peaceful. Glen voices agreement to this plan    Class 2 obesity based on BMI: Body mass index is 35.16 kg/m.[name] Significantly low or high BMI is associated with higher medical risk.  Underweight - under 18  overweight - 25 to 29 obese - 30 or more Class 1 obesity: BMI of 30.0 to 34 Class 2 obesity: BMI of 35.0 to 39 Class 3 obesity: BMI of 40.0 to 49 Super Morbid Obesity: BMI 50-59 Super-super Morbid Obesity: BMI 60+ Healthy nutrition and physical activity advised as adjunct to other disease management and risk reduction treatments    DVT prophylaxis: lovenox  IV fluids: no continuous IV fluids  Nutrition: dysphagia diet Central lines / other devices: none  Code Status: DNR ACP documentation reviewed: MOST and DNR forms are on file in [facility]  [facility] needs: hospice coordination Medical barriers to dispo: none. Expected medical readiness for discharge pending hospice arrangement / ongoing GOC             Subjective / Brief ROS:  Patient denies pain She is pleasantly confused No concerns from RN other than still not eating much, is refusing meds  .   Family Communication: bedside w/ son yesterday see above - will call later today and nursing is aware to let mek know if/when     Objective Findings:  Vitals:   07/29/24 2108 07/30/24 0024 07/30/24 0429 07/30/24 0807  BP: 97/64 109/76 (!) 97/58 (!) 141/65  Pulse: 80 91 91 68  Resp: 16 19 20 16   Temp: 98.1 F (36.7 C) 97.8 F (36.6 C) 97.8 F (36.6 C) 98 F (36.7 C)  TempSrc:    Axillary  SpO2: 94% 99% 99% 98%  Weight:      Height:        Intake/Output Summary (Last 24 hours) at 07/30/2024 1226 Last data filed at 07/30/2024 0900 Gross per 24 hour  Intake 496.35 ml  Output 200 ml  Net 296.35 ml   Filed Weights   07/27/24 0440 07/28/24 0457 07/28/24 1500  Weight: 74 kg 108 kg 89 kg    Examination:  Physical Exam Constitutional:       General: She is not in acute distress. Cardiovascular:     Rate and Rhythm: Normal rate.  Pulmonary:     Effort: Pulmonary effort is normal.     Breath sounds: Normal breath sounds.  Musculoskeletal:     Right lower leg: No edema.     Left lower leg: No edema.  Neurological:     Mental Status: She is alert. She is disoriented.  Psychiatric:        Mood and Affect: Mood normal.        Behavior: Behavior normal.          Scheduled Medications:   vitamin C   250 mg Oral BID   enoxaparin  (LOVENOX ) injection  40 mg Subcutaneous Q24H   feeding supplement  237 mL Oral BID BM   midodrine   10 mg Oral TID WC   multivitamin with minerals  1 tablet Oral Daily   mouth rinse  15 mL Mouth  Rinse 4 times per day    Continuous Infusions:  dextrose  5% lactated ringers  50 mL/hr at 07/29/24 1803   potassium chloride       PRN Medications:  dextrose , docusate sodium , mouth rinse, mouth rinse, polyethylene glycol  Antimicrobials from admission:  Anti-infectives (From admission, onward)    Start     Dose/Rate Route Frequency Ordered Stop   07/18/24 1000  cefTRIAXone  (ROCEPHIN ) 1 g in sodium chloride  0.9 % 100 mL IVPB  Status:  Discontinued        1 g 200 mL/hr over 30 Minutes Intravenous Every 24 hours 07/18/24 0356 07/26/24 1220   07/17/24 2200  ceFEPIme  (MAXIPIME ) 2 g in sodium chloride  0.9 % 100 mL IVPB        2 g 200 mL/hr over 30 Minutes Intravenous  Once 07/17/24 2147 07/17/24 2254   07/17/24 2200  metroNIDAZOLE  (FLAGYL ) IVPB 500 mg        500 mg 100 mL/hr over 60 Minutes Intravenous  Once 07/17/24 2147 07/17/24 2359   07/17/24 2200  vancomycin  (VANCOCIN ) IVPB 1000 mg/200 mL premix        1,000 mg 200 mL/hr over 60 Minutes Intravenous  Once 07/17/24 2147 07/18/24 0125           Data Reviewed:  I have personally reviewed the following...  CBC: Recent Labs  Lab 07/24/24 0537 07/25/24 0521 07/26/24 0544 07/27/24 0556 07/28/24 0418 07/29/24 0351 07/30/24 0423   WBC 9.7 9.8 10.6* 10.8* 9.6 9.9 9.6  NEUTROABS 7.0 6.4 7.4 7.8* 6.9  --   --   HGB 11.7* 9.9* 10.2* 10.9* 10.4* 10.6* 9.2*  HCT 35.2* 29.2* 30.3* 31.4* 30.8* 31.2* 28.2*  MCV 82.8 82.0 83.2 80.5 82.1 81.7 86.0  PLT 98* 90* 87* 78* 121* 119* 119*   Basic Metabolic Panel: Recent Labs  Lab 07/23/24 2224 07/24/24 0537 07/26/24 0544 07/27/24 0556 07/28/24 0418 07/29/24 0351 07/30/24 0423  NA 149*   < > 141 137 140 142 143  K 3.3*   < > 3.6 3.7 3.3* 3.5 3.1*  CL 120*   < > 114* 113* 111 110 114*  CO2 20*   < > 21* 17* 23 21* 21*  GLUCOSE 101*   < > 111* 78 97 69* 96  BUN 33*   < > 20 18 18 17 14   CREATININE 0.94   < > 0.90 0.88 0.80 0.88 0.67  CALCIUM  7.8*   < > 7.4* 7.7* 7.7* 8.1* 7.5*  MG 2.2  --   --   --   --  1.5*  --    < > = values in this interval not displayed.   GFR: Estimated Creatinine Clearance: 51.1 mL/min (by C-G formula based on SCr of 0.67 mg/dL). Liver Function Tests: No results for input(s): AST, ALT, ALKPHOS, BILITOT, PROT, ALBUMIN in the last 168 hours. No results for input(s): LIPASE, AMYLASE in the last 168 hours. No results for input(s): AMMONIA in the last 168 hours. Coagulation Profile: No results for input(s): INR, PROTIME in the last 168 hours. Cardiac Enzymes: No results for input(s): CKTOTAL, CKMB, CKMBINDEX, TROPONINI in the last 168 hours. BNP (last 3 results) No results for input(s): PROBNP in the last 8760 hours. HbA1C: No results for input(s): HGBA1C in the last 72 hours. CBG: Recent Labs  Lab 07/29/24 2042 07/30/24 0023 07/30/24 0428 07/30/24 0837 07/30/24 1137  GLUCAP 115* 98 95 97 103*   Lipid Profile: No results for input(s): CHOL, HDL, LDLCALC, TRIG, CHOLHDL, LDLDIRECT  in the last 72 hours. Thyroid Function Tests: No results for input(s): TSH, T4TOTAL, FREET4, T3FREE, THYROIDAB in the last 72 hours. Anemia Panel: No results for input(s): VITAMINB12, FOLATE,  FERRITIN, TIBC, IRON, RETICCTPCT in the last 72 hours. Most Recent Urinalysis On File:     Component Value Date/Time   COLORURINE YELLOW (A) 07/18/2024 1530   APPEARANCEUR CLOUDY (A) 07/18/2024 1530   APPEARANCEUR Hazy 11/14/2012 2046   LABSPEC 1.011 07/18/2024 1530   LABSPEC 1.021 11/14/2012 2046   PHURINE 5.0 07/18/2024 1530   GLUCOSEU NEGATIVE 07/18/2024 1530   GLUCOSEU Negative 11/14/2012 2046   HGBUR SMALL (A) 07/18/2024 1530   BILIRUBINUR NEGATIVE 07/18/2024 1530   BILIRUBINUR Negative 11/14/2012 2046   KETONESUR 5 (A) 07/18/2024 1530   PROTEINUR 30 (A) 07/18/2024 1530   NITRITE NEGATIVE 07/18/2024 1530   LEUKOCYTESUR TRACE (A) 07/18/2024 1530   LEUKOCYTESUR Negative 11/14/2012 2046   Sepsis Labs: @LABRCNTIP (procalcitonin:4,lacticidven:4) Microbiology: No results found for this or any previous visit (from the past 240 hours).     Radiology Studies last 3 days: No results found.      Deloris Mittag, DO [facility] 07/30/2024, 12:26 PM    Dictation software may have been used to generate the above note. Typos may occur and escape review in typed/dictated notes. Please contact Dr Marsa directly for clarity if needed.  Staff may message me via secure chat in Epic  but this may not receive an immediate response,  please page me for urgent matters!  If 7PM-7AM, please contact night coverage [email or web address]

## 2024-07-31 DIAGNOSIS — A419 Sepsis, unspecified organism: Secondary | ICD-10-CM | POA: Diagnosis not present

## 2024-07-31 DIAGNOSIS — R6520 Severe sepsis without septic shock: Secondary | ICD-10-CM | POA: Diagnosis not present

## 2024-07-31 LAB — CBC
HCT: 27.7 % — ABNORMAL LOW (ref 36.0–46.0)
Hemoglobin: 9.3 g/dL — ABNORMAL LOW (ref 12.0–15.0)
MCH: 27.7 pg (ref 26.0–34.0)
MCHC: 33.6 g/dL (ref 30.0–36.0)
MCV: 82.4 fL (ref 80.0–100.0)
Platelets: 110 K/uL — ABNORMAL LOW (ref 150–400)
RBC: 3.36 MIL/uL — ABNORMAL LOW (ref 3.87–5.11)
RDW: 21.8 % — ABNORMAL HIGH (ref 11.5–15.5)
WBC: 10.8 K/uL — ABNORMAL HIGH (ref 4.0–10.5)
nRBC: 0 % (ref 0.0–0.2)

## 2024-07-31 LAB — COMPREHENSIVE METABOLIC PANEL WITH GFR
ALT: 48 U/L — ABNORMAL HIGH (ref 0–44)
AST: 29 U/L (ref 15–41)
Albumin: 1.9 g/dL — ABNORMAL LOW (ref 3.5–5.0)
Alkaline Phosphatase: 53 U/L (ref 38–126)
Anion gap: 9 (ref 5–15)
BUN: 14 mg/dL (ref 8–23)
CO2: 21 mmol/L — ABNORMAL LOW (ref 22–32)
Calcium: 7.6 mg/dL — ABNORMAL LOW (ref 8.9–10.3)
Chloride: 112 mmol/L — ABNORMAL HIGH (ref 98–111)
Creatinine, Ser: 0.8 mg/dL (ref 0.44–1.00)
GFR, Estimated: >60 mL/min (ref >60)
Glucose, Bld: 115 mg/dL — ABNORMAL HIGH (ref 70–99)
Potassium: 3.6 mmol/L (ref 3.5–5.1)
Sodium: 142 mmol/L (ref 135–145)
Total Bilirubin: 1.1 mg/dL (ref 0.0–1.2)
Total Protein: 4.3 g/dL — ABNORMAL LOW (ref 6.5–8.1)

## 2024-07-31 LAB — URINALYSIS, ROUTINE W REFLEX MICROSCOPIC
Bilirubin Urine: NEGATIVE
Glucose, UA: NEGATIVE mg/dL
Ketones, ur: NEGATIVE mg/dL
Nitrite: NEGATIVE
Protein, ur: 30 mg/dL — AB
Specific Gravity, Urine: 1.025 (ref 1.005–1.030)
pH: 5 (ref 5.0–8.0)

## 2024-07-31 LAB — GLUCOSE, CAPILLARY
Glucose-Capillary: 108 mg/dL — ABNORMAL HIGH (ref 70–99)
Glucose-Capillary: 106 mg/dL — ABNORMAL HIGH (ref 70–99)
Glucose-Capillary: 113 mg/dL — ABNORMAL HIGH (ref 70–99)
Glucose-Capillary: 107 mg/dL — ABNORMAL HIGH (ref 70–99)
Glucose-Capillary: 85 mg/dL (ref 70–99)
Glucose-Capillary: 78 mg/dL (ref 70–99)

## 2024-07-31 LAB — MAGNESIUM: Magnesium: 1.4 mg/dL — ABNORMAL LOW (ref 1.7–2.4)

## 2024-07-31 LAB — LACTIC ACID, PLASMA: Lactic Acid, Venous: 1.5 mmol/L (ref 0.5–1.9)

## 2024-07-31 NOTE — Progress Notes (Signed)
 PROGRESS NOTE    Molly Parks   FMW:[id number] DOB: [birth year]  DOA: 07/17/2024 Date of Service: 07/31/24 which is hospital day 14  PCP: Eliverto Bette Hover, MD    Hospital course / significant events: 89 y.o female with significant PMH of dementia, HTN, COPD, anxiety, fairly recent development of sacral ulcer following hospitalization 05/2024 for stroke, FTT, CKD stage III, recurrent Klebsiella UTI who presented to the ED 07/17/24 with chief complaints of altered mental status. Of note, largely bedbound following her CVA and on hospice care. Upon EMS arrival, the patient was initially unresponsive, with a blood pressure reading of 52/30, which improved to 85/58 after administering 1 liter of fluids. The patient was started on a Levophed  drip. Septic shock has resolved now. Transferred to  [facility] as of 7/30. Has finished 7 days course abx for UTI. She was on hospice at home, family has discontinued this / fired Physicist, medical hospice provider. Palliative medicine team was reconsulted 07/25/2024 to clarify goals of care after family wish to pursue full aggressive treatment with all interventions including CPR and intubation and family refusal to continue with current home hospice provider --> 08/04 decision made for DNR no CPR but open to intubation if needed - MOST form completed. Reduced po intake and intermittent hypoglycemia, we are intermittently administering IV fluids / po intake as desired but patient is declining offered food, declining pills/meds, intermittently pulling IV. Clear with son that pt is not a candidate for feeding tube and is approaching end of life, he wishes to continue to treat the treatable as long as she is comfortable    Consultants:  ICU Palliative Care   Procedures/Surgeries: none      ASSESSMENT & PLAN:   Septic shock in the setting of UTI, POA Resolved now and has been off pressors Finished 7-day course of ceftriaxone , now low UOP  UA /  culture  Failure to thrive, debility Complicated by advancing underlying dementia and associated anorexia, complicated by s/p CVA  See below re: GOC discussions Palliative care following  TOC working on another hospice provider acceptance  Hypoglycemia secondary to poor oral intake Patient is not an appropriate candidate for tube feeds - GOC discussions ongoing with family Monitor glucose, dextrose  prn hypoglycemia    Hypokalemia Replace as needed Monitor BMP   Acute metabolic encephalopathy, POA: in the setting of UTI and complicated by baseline advanced dementia.  Mental status appears to be at baseline now. Delirium precautions    Sacral decubitus ulcer:  Continue wound care as per protocol.   CKD stage 3:  No acute issues now Monitor periodic BMP   Moderate protein calorie malnutrition:  low albumin in the setting of low calorie intake. Dietician on board.   COPD:  Continue with prn inhalational bronchodilator therapy   Goals of care Palliative Care team following No CPR  Artifical feeding has been discussed - son is requesting feeding tube however she is not an appropriate candidate for feeding tube given advanced dementia  07/29/24 spoke w/ son Molly Parks at bedside. Advised that we are approaching a clinical situation where the patient is not eating and everything else we are doing to treat her is just buying limited time. I advised that eventually (I suspect days or at most weeks) her organs will start failing or will have another infection or complication that will be the end. Son wants to try to treat as we are able until the time comes. I asked him what he meant by that, he  cannot describe a clear end point. I told him that I as her doctor will let him know when we have tried everything and if she is suffering I will not continue to apply treatments which are not helping her. For now will attempt re-place IV and give some hydration but if this isn't resulting in  improved po intake then we should stop IV's, blood draws, etc and let nature take its course while keeping her peaceful. Molly Parks voices agreement to this plan 08/08: son Molly Parks again requests for all available treatment short of CPR. Pt appears comfortable. He and I discussed the certainty that she will continue to decline given malnutrition and cognition, she is high risk for organ failure / infection and when this occurs would recommend transition to comfort measures only treatments. He states she would want to live, no matter what, even if it's bed bound. Continue current care. He requests for UA, already done given low UOP and abnormal gross appearance urine     Class 2 obesity based on BMI: Body mass index is 35.16 kg/m.[name] Significantly low or high BMI is associated with higher medical risk.  Underweight - under 18  overweight - 25 to 29 obese - 30 or more Class 1 obesity: BMI of 30.0 to 34 Class 2 obesity: BMI of 35.0 to 39 Class 3 obesity: BMI of 40.0 to 49 Super Morbid Obesity: BMI 50-59 Super-super Morbid Obesity: BMI 60+ Healthy nutrition and physical activity advised as adjunct to other disease management and risk reduction treatments    DVT prophylaxis: lovenox  IV fluids: no continuous IV fluids  Nutrition: dysphagia diet Central lines / other devices: none  Code Status: DNR ACP documentation reviewed: MOST and DNR forms are on file in [facility]  [facility] needs: hospice coordination Medical barriers to dispo: none. Expected medical readiness for discharge pending hospice arrangement / ongoing GOC             Subjective / Brief ROS:  Patient denies pain She is pleasantly confused No concerns from RN other than still not eating much, is refusing meds  .   Family Communication: none at this time    Objective Findings:  Vitals:   07/31/24 0408 07/31/24 0500 07/31/24 0805 07/31/24 1200  BP: 104/62  111/72 131/71  Pulse: (!) 102  (!) 101   Resp: 18  14   Temp: 98.5  F (36.9 C)  98.9 F (37.2 C)   TempSrc:      SpO2: 97%  97%   Weight:  95.7 kg    Height:        Intake/Output Summary (Last 24 hours) at 07/31/2024 1420 Last data filed at 07/31/2024 1312 Gross per 24 hour  Intake 708.82 ml  Output 400 ml  Net 308.82 ml   Filed Weights   07/28/24 0457 07/28/24 1500 07/31/24 0500  Weight: 108 kg 89 kg 95.7 kg    Examination:  Physical Exam Constitutional:      General: She is not in acute distress. Cardiovascular:     Rate and Rhythm: Normal rate.  Pulmonary:     Effort: Pulmonary effort is normal.     Breath sounds: Normal breath sounds.  Musculoskeletal:     Right lower leg: No edema.     Left lower leg: No edema.  Neurological:     Mental Status: She is alert. She is disoriented.  Psychiatric:        Mood and Affect: Mood normal.  Behavior: Behavior normal.          Scheduled Medications:   vitamin C   250 mg Oral BID   enoxaparin  (LOVENOX ) injection  40 mg Subcutaneous Q24H   feeding supplement  237 mL Oral BID BM   midodrine   10 mg Oral TID WC   multivitamin with minerals  1 tablet Oral Daily   mouth rinse  15 mL Mouth Rinse 4 times per day    Continuous Infusions:    PRN Medications:  dextrose , docusate sodium , mouth rinse, mouth rinse, polyethylene glycol  Antimicrobials from admission:  Anti-infectives (From admission, onward)    Start     Dose/Rate Route Frequency Ordered Stop   07/18/24 1000  cefTRIAXone  (ROCEPHIN ) 1 g in sodium chloride  0.9 % 100 mL IVPB  Status:  Discontinued        1 g 200 mL/hr over 30 Minutes Intravenous Every 24 hours 07/18/24 0356 07/26/24 1220   07/17/24 2200  ceFEPIme  (MAXIPIME ) 2 g in sodium chloride  0.9 % 100 mL IVPB        2 g 200 mL/hr over 30 Minutes Intravenous  Once 07/17/24 2147 07/17/24 2254   07/17/24 2200  metroNIDAZOLE  (FLAGYL ) IVPB 500 mg        500 mg 100 mL/hr over 60 Minutes Intravenous  Once 07/17/24 2147 07/17/24 2359   07/17/24 2200  vancomycin   (VANCOCIN ) IVPB 1000 mg/200 mL premix        1,000 mg 200 mL/hr over 60 Minutes Intravenous  Once 07/17/24 2147 07/18/24 0125           Data Reviewed:  I have personally reviewed the following...  CBC: Recent Labs  Lab 07/25/24 0521 07/26/24 0544 07/27/24 0556 07/28/24 0418 07/29/24 0351 07/30/24 0423 07/31/24 0446  WBC 9.8 10.6* 10.8* 9.6 9.9 9.6 10.8*  NEUTROABS 6.4 7.4 7.8* 6.9  --   --   --   HGB 9.9* 10.2* 10.9* 10.4* 10.6* 9.2* 9.3*  HCT 29.2* 30.3* 31.4* 30.8* 31.2* 28.2* 27.7*  MCV 82.0 83.2 80.5 82.1 81.7 86.0 82.4  PLT 90* 87* 78* 121* 119* 119* 110*   Basic Metabolic Panel: Recent Labs  Lab 07/27/24 0556 07/28/24 0418 07/29/24 0351 07/30/24 0423 07/31/24 0446  NA 137 140 142 143 142  K 3.7 3.3* 3.5 3.1* 3.6  CL 113* 111 110 114* 112*  CO2 17* 23 21* 21* 21*  GLUCOSE 78 97 69* 96 115*  BUN 18 18 17 14 14   CREATININE 0.88 0.80 0.88 0.67 0.80  CALCIUM  7.7* 7.7* 8.1* 7.5* 7.6*  MG  --   --  1.5* 1.6* 1.4*   GFR: Estimated Creatinine Clearance: 52.9 mL/min (by C-G formula based on SCr of 0.8 mg/dL). Liver Function Tests: Recent Labs  Lab 07/31/24 0446  AST 29  ALT 48*  ALKPHOS 53  BILITOT 1.1  PROT 4.3*  ALBUMIN 1.9*   No results for input(s): LIPASE, AMYLASE in the last 168 hours. No results for input(s): AMMONIA in the last 168 hours. Coagulation Profile: No results for input(s): INR, PROTIME in the last 168 hours. Cardiac Enzymes: No results for input(s): CKTOTAL, CKMB, CKMBINDEX, TROPONINI in the last 168 hours. BNP (last 3 results) No results for input(s): PROBNP in the last 8760 hours. HbA1C: No results for input(s): HGBA1C in the last 72 hours. CBG: Recent Labs  Lab 07/30/24 1932 07/31/24 0105 07/31/24 0410 07/31/24 0850 07/31/24 1212  GLUCAP 144* 108* 106* 113* 107*   Lipid Profile: No results for input(s): CHOL, HDL,  LDLCALC, TRIG, CHOLHDL, LDLDIRECT in the last 72 hours. Thyroid  Function Tests: No results for input(s): TSH, T4TOTAL, FREET4, T3FREE, THYROIDAB in the last 72 hours. Anemia Panel: No results for input(s): VITAMINB12, FOLATE, FERRITIN, TIBC, IRON, RETICCTPCT in the last 72 hours. Most Recent Urinalysis On File:     Component Value Date/Time   COLORURINE AMBER (A) 07/30/2024 0238   APPEARANCEUR HAZY (A) 07/30/2024 0238   APPEARANCEUR Hazy 11/14/2012 2046   LABSPEC 1.025 07/30/2024 0238   LABSPEC 1.021 11/14/2012 2046   PHURINE 5.0 07/30/2024 0238   GLUCOSEU NEGATIVE 07/30/2024 0238   GLUCOSEU Negative 11/14/2012 2046   HGBUR SMALL (A) 07/30/2024 0238   BILIRUBINUR NEGATIVE 07/30/2024 0238   BILIRUBINUR Negative 11/14/2012 2046   KETONESUR NEGATIVE 07/30/2024 0238   PROTEINUR 30 (A) 07/30/2024 0238   NITRITE NEGATIVE 07/30/2024 0238   LEUKOCYTESUR SMALL (A) 07/30/2024 0238   LEUKOCYTESUR Negative 11/14/2012 2046   Sepsis Labs: @LABRCNTIP (procalcitonin:4,lacticidven:4) Microbiology: No results found for this or any previous visit (from the past 240 hours).     Radiology Studies last 3 days: No results found.      Fronie Holstein, DO [facility] 07/31/2024, 2:20 PM    Dictation software may have been used to generate the above note. Typos may occur and escape review in typed/dictated notes. Please contact Dr Marsa directly for clarity if needed.  Staff may message me via secure chat in Epic  but this may not receive an immediate response,  please page me for urgent matters!  If 7PM-7AM, please contact night coverage [email or web address]

## 2024-07-31 NOTE — Plan of Care (Signed)
  Problem: Education: Goal: Knowledge of General Education information will improve Description: Including pain rating scale, medication(s)/side effects and non-pharmacologic comfort measures Outcome: Progressing   Problem: Health Behavior/Discharge Planning: Goal: Ability to manage health-related needs will improve Outcome: Progressing   Problem: Clinical Measurements: Goal: Ability to maintain clinical measurements within normal limits will improve Outcome: Progressing Goal: Will remain free from infection Outcome: Progressing Goal: Diagnostic test results will improve Outcome: Progressing Goal: Respiratory complications will improve Outcome: Progressing Goal: Cardiovascular complication will be avoided Outcome: Progressing   Problem: Nutrition: Goal: Adequate nutrition will be maintained Outcome: Progressing   Problem: Coping: Goal: Level of anxiety will decrease Outcome: Progressing   Problem: Pain Managment: Goal: General experience of comfort will improve and/or be controlled Outcome: Progressing   Problem: Skin Integrity: Goal: Risk for impaired skin integrity will decrease Outcome: Progressing

## 2024-08-01 DIAGNOSIS — R6520 Severe sepsis without septic shock: Secondary | ICD-10-CM | POA: Diagnosis not present

## 2024-08-01 DIAGNOSIS — A419 Sepsis, unspecified organism: Secondary | ICD-10-CM | POA: Diagnosis not present

## 2024-08-01 LAB — CBC
HCT: 26.4 % — ABNORMAL LOW (ref 36.0–46.0)
Hemoglobin: 8.9 g/dL — ABNORMAL LOW (ref 12.0–15.0)
MCH: 27.8 pg (ref 26.0–34.0)
MCHC: 33.7 g/dL (ref 30.0–36.0)
MCV: 82.5 fL (ref 80.0–100.0)
Platelets: 108 K/uL — ABNORMAL LOW (ref 150–400)
RBC: 3.2 MIL/uL — ABNORMAL LOW (ref 3.87–5.11)
RDW: 21.5 % — ABNORMAL HIGH (ref 11.5–15.5)
WBC: 9.7 K/uL (ref 4.0–10.5)
nRBC: 0 % (ref 0.0–0.2)

## 2024-08-01 LAB — BASIC METABOLIC PANEL WITH GFR
Anion gap: 6 (ref 5–15)
BUN: 14 mg/dL (ref 8–23)
CO2: 23 mmol/L (ref 22–32)
Calcium: 7.4 mg/dL — ABNORMAL LOW (ref 8.9–10.3)
Chloride: 114 mmol/L — ABNORMAL HIGH (ref 98–111)
Creatinine, Ser: 0.85 mg/dL (ref 0.44–1.00)
GFR, Estimated: >60 mL/min (ref >60)
Glucose, Bld: 78 mg/dL (ref 70–99)
Potassium: 3.2 mmol/L — ABNORMAL LOW (ref 3.5–5.1)
Sodium: 143 mmol/L (ref 135–145)

## 2024-08-01 LAB — GLUCOSE, CAPILLARY
Glucose-Capillary: 154 mg/dL — ABNORMAL HIGH (ref 70–99)
Glucose-Capillary: 64 mg/dL — ABNORMAL LOW (ref 70–99)
Glucose-Capillary: 79 mg/dL (ref 70–99)
Glucose-Capillary: 82 mg/dL (ref 70–99)
Glucose-Capillary: 85 mg/dL (ref 70–99)
Glucose-Capillary: 86 mg/dL (ref 70–99)
Glucose-Capillary: 94 mg/dL (ref 70–99)

## 2024-08-01 MED ORDER — SODIUM CHLORIDE 0.9 % IV SOLN
1.0000 g | Freq: Two times a day (BID) | INTRAVENOUS | Status: DC
  Administered 2024-08-01 – 2024-08-02 (×4): 1 g via INTRAVENOUS
  Filled 2024-08-01 (×3): qty 20

## 2024-08-01 MED ORDER — OXYCODONE HCL 5 MG PO TABS
5.0000 mg | ORAL_TABLET | Freq: Four times a day (QID) | ORAL | Status: DC | PRN
  Administered 2024-08-01 – 2024-08-03 (×3): 5 mg via ORAL
  Filled 2024-08-01 (×2): qty 1

## 2024-08-01 NOTE — Progress Notes (Addendum)
 PROGRESS NOTE    SIGNA CHEEK   FMW:[id number] DOB: Jun 22, 1928  DOA: 07/17/2024 Date of Service: 08/01/24 which is hospital day 15  PCP: Eliverto Bette Hover, MD    Hospital course / significant events: 89 y.o female with significant PMH of dementia, HTN, COPD, anxiety, fairly recent development of sacral ulcer following hospitalization 05/2024 for stroke, FTT, CKD stage III, recurrent Klebsiella UTI who presented to the ED 07/17/24 with chief complaints of altered mental status. Of note, largely bedbound following her CVA and on hospice care. Upon EMS arrival, the patient was initially unresponsive, with a blood pressure reading of 52/30, which improved to 85/58 after administering 1 liter of fluids. The patient was started on a Levophed  drip. Septic shock has resolved now. Transferred to [facility] as of 7/30. Has finished 7 days course abx for UTI. She was on hospice at home, family has discontinued this / fired Physicist, medical hospice provider. Palliative medicine team was reconsulted 07/25/2024 to clarify goals of care after family wish to pursue full aggressive treatment with all interventions including CPR and intubation and family refusal to continue with current home hospice provider --> 08/04 decision made for DNR no CPR but open to intubation if needed - MOST form completed. Reduced po intake and intermittent hypoglycemia, we are intermittently administering IV fluids / po intake as desired but patient is declining offered food, declining pills/meds, intermittently pulling IV. Clear with son that pt is not a candidate for feeding tube and is approaching end of life, he wishes to continue to treat the treatable as long as she is comfortable    Consultants:  ICU Palliative Care   Procedures/Surgeries: none      ASSESSMENT & PLAN:   Septic shock in the setting of UTI, POA Resolved now and has been off pressors Finished 7-day course of ceftriaxone , now low UOP  UA / culture  (+)pseudomonas, initiating meropenem , given already confusion will avoid cefepime  for now, pending susceptibilities   Failure to thrive, debility Complicated by advancing underlying dementia and associated anorexia, complicated by s/p CVA  See below re: GOC discussions Palliative care following  TOC working on another hospice provider acceptance  Hypoglycemia secondary to poor oral intake Patient is not an appropriate candidate for tube feeds - GOC discussions ongoing with family Monitor glucose, dextrose  prn hypoglycemia    Hypokalemia Replace as needed Monitor BMP   Acute metabolic encephalopathy, POA: in the setting of UTI and complicated by baseline advanced dementia.  Mental status appears to be at baseline now. Delirium precautions    Sacral decubitus ulcer:  Continue wound care as per protocol.   CKD stage 3:  No acute issues now Monitor periodic BMP   Moderate protein calorie malnutrition:  low albumin in the setting of low calorie intake. Dietician on board.   COPD:  Continue with prn inhalational bronchodilator therapy   Goals of care Palliative Care team following No CPR  Artifical feeding has been discussed - son is requesting feeding tube however she is not an appropriate candidate for feeding tube given advanced dementia  07/29/24 spoke w/ son Kimberlee at bedside. Advised that we are approaching a clinical situation where the patient is not eating and everything else we are doing to treat her is just buying limited time. I advised that eventually (I suspect days or at most weeks) her organs will start failing or will have another infection or complication that will be the end. Son wants to try to treat as we are  able until the time comes. I asked him what he meant by that, he cannot describe a clear end point. I told him that I as her doctor will let him know when we have tried everything and if she is suffering I will not continue to apply treatments which are not  helping her. For now will attempt re-place IV and give some hydration but if this isn't resulting in improved po intake then we should stop IV's, blood draws, etc and let nature take its course while keeping her peaceful. Kimberlee voices agreement to this plan 08/08: son Kimberlee again requests for all available treatment short of CPR. Pt appears comfortable. He and I discussed the certainty that she will continue to decline given malnutrition and cognition, she is high risk for organ failure / infection and when this occurs would recommend transition to comfort measures only treatments. He states she would want to live, no matter what, even if it's bed bound. Continue current care. He requests for UA, already done given low UOP and abnormal gross appearance urine     Class 2 obesity based on BMI: Body mass index is 35.16 kg/m.[name] Significantly low or high BMI is associated with higher medical risk.  Underweight - under 18  overweight - 25 to 29 obese - 30 or more Class 1 obesity: BMI of 30.0 to 34 Class 2 obesity: BMI of 35.0 to 39 Class 3 obesity: BMI of 40.0 to 49 Super Morbid Obesity: BMI 50-59 Super-super Morbid Obesity: BMI 60+ Healthy nutrition and physical activity advised as adjunct to other disease management and risk reduction treatments       DVT prophylaxis: lovenox  IV fluids: no continuous IV fluids  Nutrition: dysphagia diet Central lines / other devices: none  Code Status: DNR ACP documentation reviewed: MOST and DNR forms are on file in [facility]  [facility] needs: hospice coordination Medical barriers to dispo: none. Expected medical readiness for discharge pending hospice arrangement / ongoing GOC             Subjective / Brief ROS:  Patient denies pain Requesting applesauce  Patient keeps asking when she can go home She is pleasantly confused No concerns from RN other than still not eating much, is refusing meds intermittently .   Family Communication: none at  this time    Objective Findings:  Vitals:   07/31/24 2142 08/01/24 0453 08/01/24 0915 08/01/24 1200  BP: 103/66 105/66 105/63 109/80  Pulse: 89 95 99   Resp: 16 16 16    Temp: 97.6 F (36.4 C) 97.8 F (36.6 C) 97.6 F (36.4 C)   TempSrc: Oral     SpO2: 96% 95% 100%   Weight:      Height:        Intake/Output Summary (Last 24 hours) at 08/01/2024 1447 Last data filed at 08/01/2024 1300 Gross per 24 hour  Intake 0 ml  Output --  Net 0 ml   Filed Weights   07/28/24 0457 07/28/24 1500 07/31/24 0500  Weight: 108 kg 89 kg 95.7 kg    Examination:  Physical Exam Constitutional:      General: She is not in acute distress. Cardiovascular:     Rate and Rhythm: Normal rate.  Pulmonary:     Effort: Pulmonary effort is normal.     Breath sounds: Normal breath sounds.  Musculoskeletal:     Right lower leg: No edema.     Left lower leg: No edema.  Neurological:     Mental Status:  She is alert. She is disoriented.  Psychiatric:        Mood and Affect: Mood normal.        Behavior: Behavior normal.          Scheduled Medications:   vitamin C   250 mg Oral BID   enoxaparin  (LOVENOX ) injection  40 mg Subcutaneous Q24H   feeding supplement  237 mL Oral BID BM   midodrine   10 mg Oral TID WC   multivitamin with minerals  1 tablet Oral Daily   mouth rinse  15 mL Mouth Rinse 4 times per day    Continuous Infusions:    PRN Medications:  dextrose , docusate sodium , mouth rinse, mouth rinse, oxyCODONE , polyethylene glycol  Antimicrobials from admission:  Anti-infectives (From admission, onward)    Start     Dose/Rate Route Frequency Ordered Stop   07/18/24 1000  cefTRIAXone  (ROCEPHIN ) 1 g in sodium chloride  0.9 % 100 mL IVPB  Status:  Discontinued        1 g 200 mL/hr over 30 Minutes Intravenous Every 24 hours 07/18/24 0356 07/26/24 1220   07/17/24 2200  ceFEPIme  (MAXIPIME ) 2 g in sodium chloride  0.9 % 100 mL IVPB        2 g 200 mL/hr over 30 Minutes Intravenous   Once 07/17/24 2147 07/17/24 2254   07/17/24 2200  metroNIDAZOLE  (FLAGYL ) IVPB 500 mg        500 mg 100 mL/hr over 60 Minutes Intravenous  Once 07/17/24 2147 07/17/24 2359   07/17/24 2200  vancomycin  (VANCOCIN ) IVPB 1000 mg/200 mL premix        1,000 mg 200 mL/hr over 60 Minutes Intravenous  Once 07/17/24 2147 07/18/24 0125           Data Reviewed:  I have personally reviewed the following...  CBC: Recent Labs  Lab 07/26/24 0544 07/27/24 0556 07/28/24 0418 07/29/24 0351 07/30/24 0423 07/31/24 0446 08/01/24 0428  WBC 10.6* 10.8* 9.6 9.9 9.6 10.8* 9.7  NEUTROABS 7.4 7.8* 6.9  --   --   --   --   HGB 10.2* 10.9* 10.4* 10.6* 9.2* 9.3* 8.9*  HCT 30.3* 31.4* 30.8* 31.2* 28.2* 27.7* 26.4*  MCV 83.2 80.5 82.1 81.7 86.0 82.4 82.5  PLT 87* 78* 121* 119* 119* 110* 108*   Basic Metabolic Panel: Recent Labs  Lab 07/28/24 0418 07/29/24 0351 07/30/24 0423 07/31/24 0446 08/01/24 0428  NA 140 142 143 142 143  K 3.3* 3.5 3.1* 3.6 3.2*  CL 111 110 114* 112* 114*  CO2 23 21* 21* 21* 23  GLUCOSE 97 69* 96 115* 78  BUN 18 17 14 14 14   CREATININE 0.80 0.88 0.67 0.80 0.85  CALCIUM  7.7* 8.1* 7.5* 7.6* 7.4*  MG  --  1.5* 1.6* 1.4*  --    GFR: Estimated Creatinine Clearance: 49.8 mL/min (by C-G formula based on SCr of 0.85 mg/dL). Liver Function Tests: Recent Labs  Lab 07/31/24 0446  AST 29  ALT 48*  ALKPHOS 53  BILITOT 1.1  PROT 4.3*  ALBUMIN 1.9*   No results for input(s): LIPASE, AMYLASE in the last 168 hours. No results for input(s): AMMONIA in the last 168 hours. Coagulation Profile: No results for input(s): INR, PROTIME in the last 168 hours. Cardiac Enzymes: No results for input(s): CKTOTAL, CKMB, CKMBINDEX, TROPONINI in the last 168 hours. BNP (last 3 results) No results for input(s): PROBNP in the last 8760 hours. HbA1C: No results for input(s): HGBA1C in the last 72 hours. CBG: Recent  Labs  Lab 07/31/24 2144 08/01/24 0032  08/01/24 0501 08/01/24 0859 08/01/24 1203  GLUCAP 78 64* 79 86 85   Lipid Profile: No results for input(s): CHOL, HDL, LDLCALC, TRIG, CHOLHDL, LDLDIRECT in the last 72 hours. Thyroid Function Tests: No results for input(s): TSH, T4TOTAL, FREET4, T3FREE, THYROIDAB in the last 72 hours. Anemia Panel: No results for input(s): VITAMINB12, FOLATE, FERRITIN, TIBC, IRON, RETICCTPCT in the last 72 hours. Most Recent Urinalysis On File:     Component Value Date/Time   COLORURINE AMBER (A) 07/30/2024 0238   APPEARANCEUR HAZY (A) 07/30/2024 0238   APPEARANCEUR Hazy 11/14/2012 2046   LABSPEC 1.025 07/30/2024 0238   LABSPEC 1.021 11/14/2012 2046   PHURINE 5.0 07/30/2024 0238   GLUCOSEU NEGATIVE 07/30/2024 0238   GLUCOSEU Negative 11/14/2012 2046   HGBUR SMALL (A) 07/30/2024 0238   BILIRUBINUR NEGATIVE 07/30/2024 0238   BILIRUBINUR Negative 11/14/2012 2046   KETONESUR NEGATIVE 07/30/2024 0238   PROTEINUR 30 (A) 07/30/2024 0238   NITRITE NEGATIVE 07/30/2024 0238   LEUKOCYTESUR SMALL (A) 07/30/2024 0238   LEUKOCYTESUR Negative 11/14/2012 2046   Sepsis Labs: @LABRCNTIP (procalcitonin:4,lacticidven:4) Microbiology: Recent Results (from the past 240 hours)  Urine Culture (for pregnant, neutropenic or urologic patients or patients with an indwelling urinary catheter)     Status: Abnormal (Preliminary result)   Collection Time: 07/31/24  2:38 AM   Specimen: Urine, Clean Catch  Result Value Ref Range Status   Specimen Description   Final    URINE, CLEAN CATCH Performed at [facility], [address], [place], [place] [address]    Special Requests   Final    NONE Performed at [facility], [address] [place], [place] [address]    Culture (A)  Final    60,000 COLONIES/mL PSEUDOMONAS AERUGINOSA SUSCEPTIBILITIES TO FOLLOW Performed at [facility], [address], [place], [place] [address]    Report Status PENDING   Incomplete       Radiology Studies last 3 days: No results found.      Stuti Sandin, DO [facility] 08/01/2024, 2:47 PM    Dictation software may have been used to generate the above note. Typos may occur and escape review in typed/dictated notes. Please contact Dr Marsa directly for clarity if needed.  Staff may message me via secure chat in Epic  but this may not receive an immediate response,  please page me for urgent matters!  If 7PM-7AM, please contact night coverage [email or web address]

## 2024-08-01 NOTE — Plan of Care (Signed)
   Problem: Education: Goal: Knowledge of General Education information will improve Description: Including pain rating scale, medication(s)/side effects and non-pharmacologic comfort measures Outcome: Progressing   Problem: Health Behavior/Discharge Planning: Goal: Ability to manage health-related needs will improve Outcome: Progressing   Problem: Clinical Measurements: Goal: Ability to maintain clinical measurements within normal limits will improve Outcome: Progressing Goal: Will remain free from infection Outcome: Progressing Goal: Diagnostic test results will improve Outcome: Progressing Goal: Respiratory complications will improve Outcome: Progressing Goal: Cardiovascular complication will be avoided Outcome: Progressing   Problem: Activity: Goal: Risk for activity intolerance will decrease Outcome: Progressing   Problem: Nutrition: Goal: Adequate nutrition will be maintained Outcome: Progressing   Problem: Pain Managment: Goal: General experience of comfort will improve and/or be controlled Outcome: Progressing   Problem: Safety: Goal: Ability to remain free from injury will improve Outcome: Progressing   Problem: Skin Integrity: Goal: Risk for impaired skin integrity will decrease Outcome: Progressing

## 2024-08-01 NOTE — TOC Progression Note (Addendum)
 Transition of Care [facility]) - Progression Note    Patient Details  Name: Molly Parks MRN: [id number] Date of Birth: 1928-01-26  [facility] CM/SW Contact  [place] Kamren [place], [place] Phone Number: 08/01/2024, 1:46 PM  Clinical Narrative:     CSW spoke with patient's son, Molly Parks. Molly Parks advised that he has not made a choice regarding home hospice. He stated, I am mowing the lawn right now. I am trying to get this out of the way so I can come to the hospital. I will advise of a choice tomorrow morning.   Molly Parks presented as nonchalant and not concerned about making a decision for his mother. He has been asked several times but he does not make this a priority.      3:07pm: CSW received a text notification from Molly Parks advising that he would like [facility] to provide home hospice for his mother.   3:11pm: CSW contacted [facility] and spoke with Chiquita. Information was provided. Order for Home Hospice needed from attending. Patient was accepted for services. Molly Parks will be contacted.               Expected Discharge Plan and Services   Discharge Planning Services: CM Consult                                           Social Drivers of Health (SDOH) Interventions SDOH Screenings   Food Insecurity: No Food Insecurity (07/18/2024)  Housing: Low Risk  (07/18/2024)  Transportation Needs: No Transportation Needs (07/18/2024)  Utilities: Not At Risk (07/18/2024)  Social Connections: Patient Declined (07/18/2024)  Tobacco Use: Low Risk  (07/18/2024)    Readmission Risk Interventions     No data to display

## 2024-08-01 NOTE — Progress Notes (Signed)
 Patient refused all care by this RN. MD aware. RN continued to round on patient and assess her needs but patient refused. Nurse tech was able to obtain blood glucose level at 0000 which was low, RN and and Nurse tech offered patient beverages and food. Patient declined stating get out of my face before I pop you in the face. Provider aware. Will continue to monitor patient.

## 2024-08-02 DIAGNOSIS — A419 Sepsis, unspecified organism: Secondary | ICD-10-CM | POA: Diagnosis not present

## 2024-08-02 DIAGNOSIS — R6520 Severe sepsis without septic shock: Secondary | ICD-10-CM | POA: Diagnosis not present

## 2024-08-02 LAB — BASIC METABOLIC PANEL WITH GFR
Anion gap: 9 (ref 5–15)
BUN: 15 mg/dL (ref 8–23)
CO2: 21 mmol/L — ABNORMAL LOW (ref 22–32)
Calcium: 7.5 mg/dL — ABNORMAL LOW (ref 8.9–10.3)
Chloride: 114 mmol/L — ABNORMAL HIGH (ref 98–111)
Creatinine, Ser: 0.8 mg/dL (ref 0.44–1.00)
GFR, Estimated: >60 mL/min (ref >60)
Glucose, Bld: 74 mg/dL (ref 70–99)
Potassium: 3.4 mmol/L — ABNORMAL LOW (ref 3.5–5.1)
Sodium: 144 mmol/L (ref 135–145)

## 2024-08-02 LAB — GLUCOSE, CAPILLARY
Glucose-Capillary: 126 mg/dL — ABNORMAL HIGH (ref 70–99)
Glucose-Capillary: 74 mg/dL (ref 70–99)
Glucose-Capillary: 73 mg/dL (ref 70–99)
Glucose-Capillary: 79 mg/dL (ref 70–99)
Glucose-Capillary: 75 mg/dL (ref 70–99)

## 2024-08-02 LAB — CBC
HCT: 30.3 % — ABNORMAL LOW (ref 36.0–46.0)
Hemoglobin: 10 g/dL — ABNORMAL LOW (ref 12.0–15.0)
MCH: 28.2 pg (ref 26.0–34.0)
MCHC: 33 g/dL (ref 30.0–36.0)
MCV: 85.6 fL (ref 80.0–100.0)
Platelets: 97 K/uL — ABNORMAL LOW (ref 150–400)
RBC: 3.54 MIL/uL — ABNORMAL LOW (ref 3.87–5.11)
RDW: 22.3 % — ABNORMAL HIGH (ref 11.5–15.5)
WBC: 10.9 K/uL — ABNORMAL HIGH (ref 4.0–10.5)
nRBC: 0 % (ref 0.0–0.2)

## 2024-08-02 LAB — URINE CULTURE: Culture: 80000 — AB

## 2024-08-02 MED ORDER — CALCIUM CARBONATE 1250 (500 CA) MG PO TABS
1000.0000 mg | ORAL_TABLET | Freq: Every day | ORAL | Status: DC
  Administered 2024-08-03 (×2): 2500 mg via ORAL
  Filled 2024-08-02: qty 2

## 2024-08-02 MED ORDER — FOSFOMYCIN TROMETHAMINE 3 G PO PACK
3.0000 g | PACK | Freq: Once | ORAL | Status: AC
  Administered 2024-08-02 (×2): 3 g via ORAL
  Filled 2024-08-02: qty 3

## 2024-08-02 NOTE — Progress Notes (Addendum)
 PROGRESS NOTE    Molly Parks   FMW:[id number] DOB: 01-22-1928  DOA: 07/17/2024 Date of Service: 08/02/24 which is hospital day 16  PCP: Eliverto Bette Hover, MD    Hospital course / significant events: 89 y.o female with significant PMH of dementia, HTN, COPD, anxiety, fairly recent development of sacral ulcer following hospitalization 05/2024 for stroke, FTT, CKD stage III, recurrent Klebsiella UTI who presented to the ED 07/17/24 with chief complaints of altered mental status. Of note, largely bedbound following her CVA and on hospice care.   Upon EMS arrival, the patient was initially unresponsive, with a blood pressure reading of 52/30, which improved to 85/58 after administering 1 liter of fluids. The patient was started on a Levophed  drip. Septic shock resolved. Transferred to [facility] Service as of 7/30. Finished 7 days course abx for UTI.   She was on hospice at home, family has discontinued this / fired Physicist, medical hospice provider. Palliative medicine team was reconsulted 07/25/2024 to clarify goals of care after family wish to pursue full aggressive treatment with all interventions including CPR and intubation and family refusal to continue with current home hospice provider --> 08/04 decision made for DNR no CPR but open to intubation if needed - MOST form completed.   Reduced po intake and intermittent hypoglycemia, we are intermittently administering IV fluids / po intake as desired but patient is declining offered food, declining pills/meds, intermittently pulling IV. Clear with son that pt is not a candidate for feeding tube and is approaching end of life, he wishes to continue to treat the treatable despite advice that treatments will not be likely to alter her trajectory. I have advised that continuing to try to montiot labs and vitals, force medications, etc (especially when patient is pulling IV, refusing to take medications, refusing food and drink) is certainly bothering her more  than helping her and if she would want focus on quality of life rather than quantity of time then we should transition to comfort-only care. Son insists she would want to stay alive no matter what and would want aggressive care/   As of 08/11, treating for pseudomonas in urine (suspect more colonization / asymptomatic bacteriuria vs true UTI but pt cannot describe symptoms) - I have asked ID to weigh in on abx plan - bladder scan post-void 65 mL so low UOP, continue w/ fosfomycin. [facility] hospice has accepted patient. Hopefully can dc home w/ hospice. I have spoke w/ [facility] directly about the above.    Consultants:  ICU Palliative Care   Procedures/Surgeries: none      ASSESSMENT & PLAN:   Septic shock in the setting of UTI, POA Septic shock resolved New pseudomonas UTI vs asymptomatic bacteriuria - pt unable to express if any symptoms  Resolved now and has been off pressors Finished 7-day course of ceftriaxone , now low UOP  UA / culture (+)pseudomonas, initiating meropenem , given already confusion will avoid cefepime  for now. Allergy to cipro , will rx fosfomycin Son is educated that she is likely to develop UTI / other infections given malnourishment and bedbound    Failure to thrive, debility Complicated by advancing underlying dementia and associated anorexia, complicated by s/p CVA  See below re: GOC discussions Palliative care following  TOC working on another hospice provider acceptance  Hypoglycemia secondary to poor oral intake Patient is not an appropriate candidate for tube feeds - GOC discussions ongoing with family Monitor glucose, dextrose  prn hypoglycemia    Hypokalemia Replace as needed Monitor BMP  Acute metabolic encephalopathy, POA: in the setting of UTI and complicated by baseline advanced dementia.  Mental status appears to be at baseline now. Delirium precautions    Sacral decubitus ulcer:  Continue wound care as per protocol.   CKD stage 3:   No acute issues now Monitor periodic BMP   Moderate protein calorie malnutrition:  low albumin in the setting of low calorie intake. Dietician on board.   COPD:  Continue with prn inhalational bronchodilator therapy   Goals of care Palliative Care team following No CPR  Artifical feeding has been discussed - son is requesting feeding tube however she is not an appropriate candidate for feeding tube given advanced dementia  07/29/24 spoke w/ son Molly Parks at bedside. Advised that we are approaching a clinical situation where the patient is not eating and everything else we are doing to treat her is just buying limited time. I advised that eventually (I suspect days or at most weeks) her organs will start failing or will have another infection or complication that will be the end. Son wants to try to treat as we are able until the time comes. I asked him what he meant by that, he cannot describe a clear end point. I told him that I as her doctor will let him know when we have tried everything and if she is suffering I will not continue to apply treatments which are not helping her. For now will attempt re-place IV and give some hydration but if this isn't resulting in improved po intake then we should stop IV's, blood draws, etc and let nature take its course while keeping her peaceful. Molly Parks voices agreement to this plan 08/08: son Molly Parks again requests for all available treatment short of CPR. Pt appears comfortable. He and I discussed the certainty that she will continue to decline given malnutrition and cognition, she is high risk for organ failure / infection and when this occurs would recommend transition to comfort measures only treatments. He states she would want to live, no matter what, even if it's bed bound. Continue current care. He requests for UA, already done given low UOP and abnormal gross appearance urine     Class 2 obesity based on BMI: Body mass index is 35.16 kg/m.[name]  Significantly low or high BMI is associated with higher medical risk.  Underweight - under 18  overweight - 25 to 29 obese - 30 or more Class 1 obesity: BMI of 30.0 to 34 Class 2 obesity: BMI of 35.0 to 39 Class 3 obesity: BMI of 40.0 to 49 Super Morbid Obesity: BMI 50-59 Super-super Morbid Obesity: BMI 60+ Healthy nutrition and physical activity advised as adjunct to other disease management and risk reduction treatments       DVT prophylaxis: lovenox  IV fluids: no continuous IV fluids  Nutrition: dysphagia diet Central lines / other devices: none  Code Status: DNR ACP documentation reviewed: MOST and DNR forms are on file in [facility]  [facility] needs: hospice coordination Medical barriers to dispo: none. Expected medical readiness for discharge pending hospice arrangement / ongoing GOC             Subjective / Brief ROS:  Patient reports pain my ass hurts Patient keeps asking when she can go home She is pleasantly confused No concerns from RN other than still not eating much, is refusing meds intermittently .   Family Communication: spoke w/ son over the phone today     Objective Findings:  Vitals:   08/01/24  1538 08/01/24 2018 08/02/24 0404 08/02/24 0803  BP: 110/63 115/71 (!) 144/72 133/77  Pulse: 86 96 94 96  Resp: 18   14  Temp: 98.2 F (36.8 C) 98 F (36.7 C) (!) 96.9 F (36.1 C) 97.9 F (36.6 C)  TempSrc:  Oral Oral   SpO2: 97% 97% 96% 96%  Weight:      Height:        Intake/Output Summary (Last 24 hours) at 08/02/2024 1603 Last data filed at 08/02/2024 1520 Gross per 24 hour  Intake 300 ml  Output --  Net 300 ml   Filed Weights   07/28/24 0457 07/28/24 1500 07/31/24 0500  Weight: 108 kg 89 kg 95.7 kg    Examination:  Physical Exam Constitutional:      General: She is not in acute distress. Cardiovascular:     Rate and Rhythm: Normal rate.  Pulmonary:     Effort: Pulmonary effort is normal.     Breath sounds: Normal breath sounds.   Musculoskeletal:     Right lower leg: No edema.     Left lower leg: No edema.  Neurological:     Mental Status: She is alert. Mental status is at baseline. She is disoriented.  Psychiatric:        Mood and Affect: Mood normal.        Behavior: Behavior normal.          Scheduled Medications:   vitamin C   250 mg Oral BID   enoxaparin  (LOVENOX ) injection  40 mg Subcutaneous Q24H   feeding supplement  237 mL Oral BID BM   fosfomycin  3 g Oral Once   midodrine   10 mg Oral TID WC   multivitamin with minerals  1 tablet Oral Daily   mouth rinse  15 mL Mouth Rinse 4 times per day    Continuous Infusions:     PRN Medications:  dextrose , docusate sodium , mouth rinse, mouth rinse, oxyCODONE , polyethylene glycol  Antimicrobials from admission:  Anti-infectives (From admission, onward)    Start     Dose/Rate Route Frequency Ordered Stop   08/02/24 1700  fosfomycin (MONUROL ) packet 3 g        3 g Oral  Once 08/02/24 1552     08/01/24 1545  meropenem  (MERREM ) 1 g in sodium chloride  0.9 % 100 mL IVPB  Status:  Discontinued        1 g 200 mL/hr over 30 Minutes Intravenous Every 12 hours 08/01/24 1453 08/02/24 1552   07/18/24 1000  cefTRIAXone  (ROCEPHIN ) 1 g in sodium chloride  0.9 % 100 mL IVPB  Status:  Discontinued        1 g 200 mL/hr over 30 Minutes Intravenous Every 24 hours 07/18/24 0356 07/26/24 1220   07/17/24 2200  ceFEPIme  (MAXIPIME ) 2 g in sodium chloride  0.9 % 100 mL IVPB        2 g 200 mL/hr over 30 Minutes Intravenous  Once 07/17/24 2147 07/17/24 2254   07/17/24 2200  metroNIDAZOLE  (FLAGYL ) IVPB 500 mg        500 mg 100 mL/hr over 60 Minutes Intravenous  Once 07/17/24 2147 07/17/24 2359   07/17/24 2200  vancomycin  (VANCOCIN ) IVPB 1000 mg/200 mL premix        1,000 mg 200 mL/hr over 60 Minutes Intravenous  Once 07/17/24 2147 07/18/24 0125           Data Reviewed:  I have personally reviewed the following...  CBC: Recent Labs  Lab 07/27/24 [phone]  07/28/24 0418 07/29/24 0351 07/30/24 0423 07/31/24 0446 08/01/24 0428 08/02/24 0727  WBC 10.8* 9.6 9.9 9.6 10.8* 9.7 10.9*  NEUTROABS 7.8* 6.9  --   --   --   --   --   HGB 10.9* 10.4* 10.6* 9.2* 9.3* 8.9* 10.0*  HCT 31.4* 30.8* 31.2* 28.2* 27.7* 26.4* 30.3*  MCV 80.5 82.1 81.7 86.0 82.4 82.5 85.6  PLT 78* 121* 119* 119* 110* 108* 97*   Basic Metabolic Panel: Recent Labs  Lab 07/29/24 0351 07/30/24 0423 07/31/24 0446 08/01/24 0428 08/02/24 0727  NA 142 143 142 143 144  K 3.5 3.1* 3.6 3.2* 3.4*  CL 110 114* 112* 114* 114*  CO2 21* 21* 21* 23 21*  GLUCOSE 69* 96 115* 78 74  BUN 17 14 14 14 15   CREATININE 0.88 0.67 0.80 0.85 0.80  CALCIUM  8.1* 7.5* 7.6* 7.4* 7.5*  MG 1.5* 1.6* 1.4*  --   --    GFR: Estimated Creatinine Clearance: 52.9 mL/min (by C-G formula based on SCr of 0.8 mg/dL). Liver Function Tests: Recent Labs  Lab 07/31/24 0446  AST 29  ALT 48*  ALKPHOS 53  BILITOT 1.1  PROT 4.3*  ALBUMIN 1.9*   No results for input(s): LIPASE, AMYLASE in the last 168 hours. No results for input(s): AMMONIA in the last 168 hours. Coagulation Profile: No results for input(s): INR, PROTIME in the last 168 hours. Cardiac Enzymes: No results for input(s): CKTOTAL, CKMB, CKMBINDEX, TROPONINI in the last 168 hours. BNP (last 3 results) No results for input(s): PROBNP in the last 8760 hours. HbA1C: No results for input(s): HGBA1C in the last 72 hours. CBG: Recent Labs  Lab 08/01/24 2016 08/01/24 2348 08/02/24 0405 08/02/24 0805 08/02/24 1219  GLUCAP 82 154* 126* 74 73   Lipid Profile: No results for input(s): CHOL, HDL, LDLCALC, TRIG, CHOLHDL, LDLDIRECT in the last 72 hours. Thyroid Function Tests: No results for input(s): TSH, T4TOTAL, FREET4, T3FREE, THYROIDAB in the last 72 hours. Anemia Panel: No results for input(s): VITAMINB12, FOLATE, FERRITIN, TIBC, IRON, RETICCTPCT in the last 72 hours. Most Recent  Urinalysis On File:     Component Value Date/Time   COLORURINE AMBER (A) 07/30/2024 0238   APPEARANCEUR HAZY (A) 07/30/2024 0238   APPEARANCEUR Hazy 11/14/2012 2046   LABSPEC 1.025 07/30/2024 0238   LABSPEC 1.021 11/14/2012 2046   PHURINE 5.0 07/30/2024 0238   GLUCOSEU NEGATIVE 07/30/2024 0238   GLUCOSEU Negative 11/14/2012 2046   HGBUR SMALL (A) 07/30/2024 0238   BILIRUBINUR NEGATIVE 07/30/2024 0238   BILIRUBINUR Negative 11/14/2012 2046   KETONESUR NEGATIVE 07/30/2024 0238   PROTEINUR 30 (A) 07/30/2024 0238   NITRITE NEGATIVE 07/30/2024 0238   LEUKOCYTESUR SMALL (A) 07/30/2024 0238   LEUKOCYTESUR Negative 11/14/2012 2046   Sepsis Labs: @LABRCNTIP (procalcitonin:4,lacticidven:4) Microbiology: Recent Results (from the past 240 hours)  Urine Culture (for pregnant, neutropenic or urologic patients or patients with an indwelling urinary catheter)     Status: Abnormal   Collection Time: 07/31/24  2:38 AM   Specimen: Urine, Clean Catch  Result Value Ref Range Status   Specimen Description   Final    URINE, CLEAN CATCH Performed at [facility], [address], [place], [place] [address]    Special Requests   Final    NONE Performed at [facility], [address] [place], [place] [address]    Culture 80,000 COLONIES/mL PSEUDOMONAS AERUGINOSA (A)  Final   Report Status 08/02/2024 FINAL  Final   Organism ID, Bacteria PSEUDOMONAS  AERUGINOSA (A)  Final      Susceptibility   Pseudomonas aeruginosa - MIC*    CEFTAZIDIME 2 SENSITIVE Sensitive     CIPROFLOXACIN  <=0.25 SENSITIVE Sensitive     GENTAMICIN  <=1 SENSITIVE Sensitive     IMIPENEM <=0.25 SENSITIVE Sensitive     PIP/TAZO <=4 SENSITIVE Sensitive ug/mL    CEFEPIME  0.5 SENSITIVE Sensitive     * 80,000 COLONIES/mL PSEUDOMONAS AERUGINOSA       Radiology Studies last 3 days: No results found.      Lisel Siegrist, DO [facility] 08/02/2024, 4:03 PM    Dictation software may have been  used to generate the above note. Typos may occur and escape review in typed/dictated notes. Please contact Dr Marsa directly for clarity if needed.  Staff may message me via secure chat in Epic  but this may not receive an immediate response,  please page me for urgent matters!  If 7PM-7AM, please contact night coverage [email or web address]

## 2024-08-02 NOTE — Plan of Care (Signed)

## 2024-08-02 NOTE — Plan of Care (Signed)
 This RN  - informed by assisting RN that patient refused/spit out AM meds.  Per MAR, only possible critical med would be midodrine , but BP currently 133/70.  Patient stable, but will continue to assess.

## 2024-08-03 DIAGNOSIS — Z515 Encounter for palliative care: Secondary | ICD-10-CM | POA: Diagnosis not present

## 2024-08-03 DIAGNOSIS — E43 Unspecified severe protein-calorie malnutrition: Secondary | ICD-10-CM | POA: Diagnosis not present

## 2024-08-03 DIAGNOSIS — A419 Sepsis, unspecified organism: Secondary | ICD-10-CM | POA: Diagnosis not present

## 2024-08-03 DIAGNOSIS — R6520 Severe sepsis without septic shock: Secondary | ICD-10-CM | POA: Diagnosis not present

## 2024-08-03 DIAGNOSIS — R4182 Altered mental status, unspecified: Secondary | ICD-10-CM | POA: Diagnosis not present

## 2024-08-03 LAB — BASIC METABOLIC PANEL WITH GFR
Anion gap: 7 (ref 5–15)
BUN: 15 mg/dL (ref 8–23)
CO2: 22 mmol/L (ref 22–32)
Calcium: 7.5 mg/dL — ABNORMAL LOW (ref 8.9–10.3)
Chloride: 115 mmol/L — ABNORMAL HIGH (ref 98–111)
Creatinine, Ser: 0.75 mg/dL (ref 0.44–1.00)
GFR, Estimated: >60 mL/min (ref >60)
Glucose, Bld: 73 mg/dL (ref 70–99)
Potassium: 3 mmol/L — ABNORMAL LOW (ref 3.5–5.1)
Sodium: 144 mmol/L (ref 135–145)

## 2024-08-03 LAB — GLUCOSE, CAPILLARY
Glucose-Capillary: 63 mg/dL — ABNORMAL LOW (ref 70–99)
Glucose-Capillary: 71 mg/dL (ref 70–99)
Glucose-Capillary: 71 mg/dL (ref 70–99)
Glucose-Capillary: 78 mg/dL (ref 70–99)
Glucose-Capillary: 84 mg/dL (ref 70–99)
Glucose-Capillary: 88 mg/dL (ref 70–99)

## 2024-08-03 LAB — CBC
HCT: 29.3 % — ABNORMAL LOW (ref 36.0–46.0)
Hemoglobin: 9.5 g/dL — ABNORMAL LOW (ref 12.0–15.0)
MCH: 27.9 pg (ref 26.0–34.0)
MCHC: 32.4 g/dL (ref 30.0–36.0)
MCV: 85.9 fL (ref 80.0–100.0)
Platelets: 96 K/uL — ABNORMAL LOW (ref 150–400)
RBC: 3.41 MIL/uL — ABNORMAL LOW (ref 3.87–5.11)
RDW: 22.1 % — ABNORMAL HIGH (ref 11.5–15.5)
WBC: 9.7 K/uL (ref 4.0–10.5)
nRBC: 0 % (ref 0.0–0.2)

## 2024-08-03 MED ORDER — ASCORBIC ACID 250 MG PO TABS
250.0000 mg | ORAL_TABLET | Freq: Two times a day (BID) | ORAL | 0 refills | Status: DC
Start: 2024-08-03 — End: 2024-10-27

## 2024-08-03 MED ORDER — OXYCODONE HCL 5 MG PO TABS: 5.0000 mg | ORAL_TABLET | ORAL | 0 refills | Status: DC | PRN

## 2024-08-03 MED ORDER — POTASSIUM CHLORIDE 20 MEQ PO PACK
40.0000 meq | PACK | Freq: Every day | ORAL | Status: DC
  Administered 2024-08-03 (×2): 40 meq via ORAL
  Filled 2024-08-03: qty 2

## 2024-08-03 MED ORDER — DOCUSATE SODIUM 100 MG PO CAPS: 100.0000 mg | ORAL_CAPSULE | Freq: Two times a day (BID) | ORAL | Status: DC | PRN

## 2024-08-03 MED ORDER — POTASSIUM CHLORIDE 10 MEQ/100ML IV SOLN
10.0000 meq | INTRAVENOUS | Status: DC
  Administered 2024-08-03 (×2): 10 meq via INTRAVENOUS
  Filled 2024-08-03: qty 100

## 2024-08-03 MED ORDER — MIDODRINE HCL 10 MG PO TABS: 5.0000 mg | ORAL_TABLET | Freq: Three times a day (TID) | ORAL | 0 refills | Status: DC

## 2024-08-03 NOTE — Discharge Summary (Signed)
 Physician Discharge Summary   Patient: Molly Parks MRN: [id number]  DOB: 1928/01/11   Admit:     Date of Admission: 07/17/2024 Admitted from: home hospice   Discharge: Date of discharge: 08/03/24 Disposition: Hospice care Condition at discharge: poor  CODE STATUS: DNR     Discharge Physician: Laneta Blunt, DO [facility]     PCP: Eliverto Bette Hover, MD  Recommendations for Outpatient Follow-up:  Follow up with PCP / with hospice    Discharge Instructions     Diet - low sodium heart healthy   Complete by: As directed    Discharge wound care:   Complete by: As directed    Routine wound care / pressure prevention to sacrum   Increase activity slowly   Complete by: As directed    No wound care   Complete by: As directed          Discharge Diagnoses: Principal Problem:   Severe sepsis with acute organ dysfunction (HCC) Active Problems:   Protein-calorie malnutrition, severe      Hospital course / significant events: 89 y.o female with significant PMH of dementia, HTN, COPD, anxiety, fairly recent development of sacral ulcer following hospitalization 05/2024 for stroke, FTT, CKD stage III, recurrent Klebsiella UTI who presented to the ED 07/17/24 with chief complaints of altered mental status. Of note, largely bedbound following her CVA and on hospice care.   Upon EMS arrival, the patient was initially unresponsive, with a blood pressure reading of 52/30, which improved to 85/58 after administering 1 liter of fluids. The patient was started on a Levophed  drip. Septic shock resolved. Transferred to [facility] as of 7/30. Finished 7 days course abx for UTI.   She was on hospice at home, family has discontinued this / fired Physicist, medical hospice provider. Palliative medicine team was reconsulted 07/25/2024 to clarify goals of care after family wish to pursue full aggressive treatment with all interventions including CPR and intubation and family  refusal to continue with current home hospice provider --> 08/04 decision made for DNR no CPR but open to intubation if needed - MOST form completed.   Reduced po intake and intermittent hypoglycemia, we are intermittently administering IV fluids / po intake as desired but patient is declining offered food, declining pills/meds, intermittently pulling IV. Clear with son that pt is not a candidate for feeding tube and is approaching end of life, he wishes to continue to treat the treatable despite advice that treatments will not be likely to alter her trajectory. I have advised that continuing to try to montiot labs and vitals, force medications, etc (especially when patient is pulling IV, refusing to take medications, refusing food and drink) is certainly bothering her more than helping her and if she would want focus on quality of life rather than quantity of time then we should transition to comfort-only care. Son insists she would want to stay alive no matter what and would want aggressive care/   As of 08/11, treated for pseudomonas in urine (suspect more colonization / asymptomatic bacteriuria vs true UTI but pt cannot describe symptoms) - I have asked ID to weigh in on abx plan - bladder scan post-void 65 mL so low UOP, continue w/ fosfomycin.   [facility] hospice has accepted patient. 08/03/24 can dc home w/ hospice. Hospitalist has spoken w/ Musician about the above.    Consultants:  ICU Palliative Care   Procedures/Surgeries: none      ASSESSMENT & PLAN:  Septic shock in the setting of UTI, POA Septic shock resolved New pseudomonas UTI vs asymptomatic bacteriuria - pt unable to express if any symptoms  Resolved now and has been off pressors Finished 7-day course of ceftriaxone , now low UOP  UA / culture (+)pseudomonas, tx fosfomycin completed  Son is educated that she is likely to develop UTI / other infections given malnourishment and bedbound    Failure to thrive,  debility Complicated by advancing underlying dementia and associated anorexia, complicated by s/p CVA  See below re: GOC discussions Palliative care following  TOC working on another hospice provider acceptance Son is advised that further worsening is expected if she is not eating / advancing dementia and she is expected to decompensate, multiple possible complications including but not limited to infection, electrolyte imbalance, organ failure, etc which will potentially be cause(s) of natural death - please d/w hospice if concerns for symptoms   Hypoglycemia secondary to poor oral intake Patient is not an appropriate candidate for tube feeds - GOC discussions ongoing with family Monitor glucose, dextrose  prn hypoglycemia Son is advised that hypoglycemia is expected if she is not eating and potential to decompensate due to this - please d/w hospice if concerns for symptoms     Hypokalemia Replace as needed Monitor BMP   Acute metabolic encephalopathy, POA: in the setting of UTI and complicated by baseline advanced dementia.  Mental status appears to be at baseline now. Delirium precautions    Sacral decubitus ulcer:  Continue wound care as per protocol.   CKD stage 3:  No acute issues now Monitor periodic BMP   Moderate protein calorie malnutrition:  low albumin in the setting of low calorie intake. Dietician on board.   COPD:  Continue with prn inhalational bronchodilator therapy   Goals of care Palliative Care team following No CPR  Artifical feeding has been discussed - son is requesting feeding tube however she is not an appropriate candidate for feeding tube given advanced dementia  07/29/24 spoke w/ son Kimberlee at bedside. Advised that we are approaching a clinical situation where the patient is not eating and everything else we are doing to treat her is just buying limited time. I advised that eventually (I suspect days or at most weeks) her organs will start failing or  will have another infection or complication that will be the end. Son wants to try to treat as we are able until the time comes. I asked him what he meant by that, he cannot describe a clear end point. I told him that I as her doctor will let him know when we have tried everything and if she is suffering I will not continue to apply treatments which are not helping her. For now will attempt re-place IV and give some hydration but if this isn't resulting in improved po intake then we should stop IV's, blood draws, etc and let nature take its course while keeping her peaceful. Kimberlee voices agreement to this plan 08/08: son Kimberlee again requests for all available treatment short of CPR. Pt appears comfortable. He and I discussed the certainty that she will continue to decline given malnutrition and cognition, she is high risk for organ failure / infection and when this occurs would recommend transition to comfort measures only treatments. He states she would want to live, no matter what, even if it's bed bound. Continue current care. He requests for UA, already done given low UOP and abnormal gross appearance urine     Class  2 obesity based on BMI: Body mass index is 35.16 kg/m.[name] Significantly low or high BMI is associated with higher medical risk.  Underweight - under 18  overweight - 25 to 29 obese - 30 or more Class 1 obesity: BMI of 30.0 to 34 Class 2 obesity: BMI of 35.0 to 39 Class 3 obesity: BMI of 40.0 to 49 Super Morbid Obesity: BMI 50-59 Super-super Morbid Obesity: BMI 60+ Healthy nutrition and physical activity advised as adjunct to other disease management and risk reduction treatments              Discharge Instructions  Allergies as of 08/03/2024       Reactions   Ciprofloxacin          Medication List     STOP taking these medications    amLODipine  5 MG tablet Commonly known as: NORVASC    aspirin  EC 325 MG tablet   atorvastatin  20 MG tablet Commonly  known as: LIPITOR   metoprolol  tartrate 25 MG tablet Commonly known as: LOPRESSOR    naproxen 500 MG tablet Commonly known as: NAPROSYN       TAKE these medications    Acetaminophen  500 MG capsule Take 500 mg by mouth every 6 (six) hours as needed.   ascorbic acid  250 MG tablet Commonly known as: VITAMIN C  Take 1 tablet (250 mg total) by mouth 2 (two) times daily.   bisacodyl  5 MG EC tablet Commonly known as: DULCOLAX Take 1 tablet (5 mg total) daily as needed by mouth for moderate constipation.   calcitRIOL 0.25 MCG capsule Commonly known as: ROCALTROL Take 0.25 mcg by mouth daily.   clonazePAM  0.5 MG tablet Commonly known as: KLONOPIN  Take 0.5 tablets (0.25 mg total) by mouth 2 (two) times daily as needed. What changed:  when to take this reasons to take this   docusate sodium  100 MG capsule Commonly known as: COLACE Take 1 capsule (100 mg total) by mouth 2 (two) times daily as needed for mild constipation.   feeding supplement Liqd Take 237 mLs by mouth 2 (two) times daily between meals.   midodrine  10 MG tablet Commonly known as: PROAMATINE  Take 0.5 tablets (5 mg total) by mouth 3 (three) times daily with meals.   oxyCODONE  5 MG immediate release tablet Commonly known as: Oxy IR/ROXICODONE  Take 1 tablet (5 mg total) by mouth every 4 (four) hours as needed for severe pain (pain score 7-10) or moderate pain (pain score 4-6).   polyethylene glycol 17 g packet Commonly known as: MIRALAX  / GLYCOLAX  Take 17 g by mouth daily as needed for mild constipation.   potassium chloride  10 MEQ tablet Commonly known as: KLOR-CON  Take 10 mEq by mouth daily.               Discharge Care Instructions  (From admission, onward)           Start     Ordered   08/03/24 0000  Discharge wound care:       Comments: Routine wound care / pressure prevention to sacrum   08/03/24 1410             Follow-up Information     Olmedo, Bette Hover, MD Follow up.    Specialty: Family Medicine Why: hospital follow up Contact information: [address] [place] [address] [phone]                 Allergies  Allergen Reactions   Ciprofloxacin       Subjective: pt remains  confused but says no when asked if pain or if she needs anything. Spoke on phone w/ son and no concerns for discharge home w/ hospice    Discharge Exam: BP 114/62 (BP Location: Left Arm)   Pulse 100   Temp 98.5 F (36.9 C)   Resp 19   Ht 5' 9 (1.753 m)   Wt 95.7 kg Comment: Weight isnt accurate. PT refused to move to zero out bed for accurate reading. Nurse notified.  SpO2 95%   BMI 31.16 kg/m  General: Pt is alert, awake, not in acute distress Cardiovascular: RRR Respiratory: CTA bilaterally Abdominal: Soft, NT, ND, bowel sounds + Extremities: no edema, no cyanosis     The results of significant diagnostics from this hospitalization (including imaging, microbiology, ancillary and laboratory) are listed below for reference.     Microbiology: Recent Results (from the past 240 hours)  Urine Culture (for pregnant, neutropenic or urologic patients or patients with an indwelling urinary catheter)     Status: Abnormal   Collection Time: 07/31/24  2:38 AM   Specimen: Urine, Clean Catch  Result Value Ref Range Status   Specimen Description   Final    URINE, CLEAN CATCH Performed at [facility], [address] [place], [place] [address]    Special Requests   Final    NONE Performed at [facility], [address] [place], [place] [address]    Culture 80,000 COLONIES/mL PSEUDOMONAS AERUGINOSA (A)  Final   Report Status 08/02/2024 FINAL  Final   Organism ID, Bacteria PSEUDOMONAS AERUGINOSA (A)  Final      Susceptibility   Pseudomonas aeruginosa - MIC*    CEFTAZIDIME 2 SENSITIVE Sensitive     CIPROFLOXACIN  <=0.25 SENSITIVE Sensitive     GENTAMICIN  <=1 SENSITIVE Sensitive     IMIPENEM <=0.25 SENSITIVE Sensitive      PIP/TAZO <=4 SENSITIVE Sensitive ug/mL    CEFEPIME  0.5 SENSITIVE Sensitive     * 80,000 COLONIES/mL PSEUDOMONAS AERUGINOSA     Labs: BNP (last 3 results) No results for input(s): BNP in the last 8760 hours. Basic Metabolic Panel: Recent Labs  Lab 07/29/24 0351 07/30/24 0423 07/31/24 0446 08/01/24 0428 08/02/24 0727 08/03/24 0522  NA 142 143 142 143 144 144  K 3.5 3.1* 3.6 3.2* 3.4* 3.0*  CL 110 114* 112* 114* 114* 115*  CO2 21* 21* 21* 23 21* 22  GLUCOSE 69* 96 115* 78 74 73  BUN 17 14 14 14 15 15   CREATININE 0.88 0.67 0.80 0.85 0.80 0.75  CALCIUM  8.1* 7.5* 7.6* 7.4* 7.5* 7.5*  MG 1.5* 1.6* 1.4*  --   --   --    Liver Function Tests: Recent Labs  Lab 07/31/24 0446  AST 29  ALT 48*  ALKPHOS 53  BILITOT 1.1  PROT 4.3*  ALBUMIN 1.9*   No results for input(s): LIPASE, AMYLASE in the last 168 hours. No results for input(s): AMMONIA in the last 168 hours. CBC: Recent Labs  Lab 07/28/24 0418 07/29/24 0351 07/30/24 0423 07/31/24 0446 08/01/24 0428 08/02/24 0727 08/03/24 0522  WBC 9.6   < > 9.6 10.8* 9.7 10.9* 9.7  NEUTROABS 6.9  --   --   --   --   --   --   HGB 10.4*   < > 9.2* 9.3* 8.9* 10.0* 9.5*  HCT 30.8*   < > 28.2* 27.7* 26.4* 30.3* 29.3*  MCV 82.1   < > 86.0 82.4 82.5 85.6 85.9  PLT 121*   < >  119* 110* 108* 97* 96*   < > = values in this interval not displayed.   Cardiac Enzymes: No results for input(s): CKTOTAL, CKMB, CKMBINDEX, TROPONINI in the last 168 hours. BNP: Invalid input(s): POCBNP CBG: Recent Labs  Lab 08/03/24 0057 08/03/24 0451 08/03/24 0803 08/03/24 1024 08/03/24 1147  GLUCAP 71 71 63* 88 78   D-Dimer No results for input(s): DDIMER in the last 72 hours. Hgb A1c No results for input(s): HGBA1C in the last 72 hours. Lipid Profile No results for input(s): CHOL, HDL, LDLCALC, TRIG, CHOLHDL, LDLDIRECT in the last 72 hours. Thyroid function studies No results for input(s): TSH, T4TOTAL,  T3FREE, THYROIDAB in the last 72 hours.  Invalid input(s): FREET3 Anemia work up No results for input(s): VITAMINB12, FOLATE, FERRITIN, TIBC, IRON, RETICCTPCT in the last 72 hours. Urinalysis    Component Value Date/Time   COLORURINE AMBER (A) 07/30/2024 0238   APPEARANCEUR HAZY (A) 07/30/2024 0238   APPEARANCEUR Hazy 11/14/2012 2046   LABSPEC 1.025 07/30/2024 0238   LABSPEC 1.021 11/14/2012 2046   PHURINE 5.0 07/30/2024 0238   GLUCOSEU NEGATIVE 07/30/2024 0238   GLUCOSEU Negative 11/14/2012 2046   HGBUR SMALL (A) 07/30/2024 0238   BILIRUBINUR NEGATIVE 07/30/2024 0238   BILIRUBINUR Negative 11/14/2012 2046   KETONESUR NEGATIVE 07/30/2024 0238   PROTEINUR 30 (A) 07/30/2024 0238   NITRITE NEGATIVE 07/30/2024 0238   LEUKOCYTESUR SMALL (A) 07/30/2024 0238   LEUKOCYTESUR Negative 11/14/2012 2046   Sepsis Labs Recent Labs  Lab 07/31/24 0446 08/01/24 0428 08/02/24 0727 08/03/24 0522  WBC 10.8* 9.7 10.9* 9.7   Microbiology Recent Results (from the past 240 hours)  Urine Culture (for pregnant, neutropenic or urologic patients or patients with an indwelling urinary catheter)     Status: Abnormal   Collection Time: 07/31/24  2:38 AM   Specimen: Urine, Clean Catch  Result Value Ref Range Status   Specimen Description   Final    URINE, CLEAN CATCH Performed at [facility], [address], [place], [place] [address]    Special Requests   Final    NONE Performed at [facility], [address], [place], [place] [address]    Culture 80,000 COLONIES/mL PSEUDOMONAS AERUGINOSA (A)  Final   Report Status 08/02/2024 FINAL  Final   Organism ID, Bacteria PSEUDOMONAS AERUGINOSA (A)  Final      Susceptibility   Pseudomonas aeruginosa - MIC*    CEFTAZIDIME 2 SENSITIVE Sensitive     CIPROFLOXACIN  <=0.25 SENSITIVE Sensitive     GENTAMICIN  <=1 SENSITIVE Sensitive     IMIPENEM <=0.25 SENSITIVE Sensitive     PIP/TAZO <=4 SENSITIVE Sensitive ug/mL     CEFEPIME  0.5 SENSITIVE Sensitive     * 80,000 COLONIES/mL PSEUDOMONAS AERUGINOSA   Imaging CT CHEST ABDOMEN PELVIS WO CONTRAST Result Date: 07/17/2024 CLINICAL DATA:  Unresponsive, possible sepsis EXAM: CT CHEST, ABDOMEN AND PELVIS WITHOUT CONTRAST TECHNIQUE: Multidetector CT imaging of the chest, abdomen and pelvis was performed following the standard protocol without IV contrast. RADIATION DOSE REDUCTION: This exam was performed according to the departmental dose-optimization program which includes automated exposure control, adjustment of the mA and/or kV according to patient size and/or use of iterative reconstruction technique. COMPARISON:  08/14/2022 FINDINGS: CT CHEST FINDINGS Cardiovascular: Limited due to lack of IV contrast. Atherosclerotic calcifications of the thoracic aorta are again seen. No aneurysmal dilatation is seen. Coronary calcifications are noted. No cardiac enlargement is seen. Mediastinum/Nodes: Thoracic inlet is within normal limits. No hilar or mediastinal adenopathy is noted.  The esophagus as visualized is within normal limits. Lungs/Pleura: Lungs are well aerated bilaterally. 6 mm nodule is noted in the posterior aspect of the right upper lobe. This occurs in an area of previous ground-glass opacity. Few scattered smaller less than 4 mm nodules are noted throughout both lungs. Bibasilar atelectasis is noted. Musculoskeletal: Degenerative changes of the thoracic spine are seen. No acute rib abnormality is noted. CT ABDOMEN PELVIS FINDINGS Hepatobiliary: No focal liver abnormality is seen. Status post cholecystectomy. No biliary dilatation. Pancreas: Unremarkable. No pancreatic ductal dilatation or surrounding inflammatory changes. Spleen: Normal in size without focal abnormality. Adrenals/Urinary Tract: Adrenal glands are within normal limits. Kidneys are well visualized bilaterally. No renal calculi are seen. No obstructive changes are noted. Ureters are within normal limits.  Bladder is partially distended. Stomach/Bowel: No obstructive or inflammatory changes of the colon are seen. Scattered diverticular changes noted without evidence of diverticulitis. The appendix is within normal limits. Small bowel and stomach are unremarkable. Vascular/Lymphatic: Aortic atherosclerosis. No enlarged abdominal or pelvic lymph nodes. Reproductive: Uterus and bilateral adnexa are unremarkable. Other: No abdominal wall hernia or abnormality. No abdominopelvic ascites. Musculoskeletal: Degenerative changes of lumbar spine are noted. IMPRESSION: CT of the chest: Multiple scattered pulmonary nodules the largest of which measures 6 mm in the right upper lobe. Follow-up can be performed as clinically indicated given the patient's advanced age. No other focal abnormality is noted. CT of the abdomen and pelvis: Diverticulosis without diverticulitis. No other focal abnormality is seen. Electronically Signed   By: Oneil Devonshire M.D.   On: 07/17/2024 23:04   CT Head Wo Contrast Result Date: 07/17/2024 CLINICAL DATA:  Altered mental status EXAM: CT HEAD WITHOUT CONTRAST TECHNIQUE: Contiguous axial images were obtained from the base of the skull through the vertex without intravenous contrast. RADIATION DOSE REDUCTION: This exam was performed according to the departmental dose-optimization program which includes automated exposure control, adjustment of the mA and/or kV according to patient size and/or use of iterative reconstruction technique. COMPARISON:  06/02/2024 FINDINGS: Brain: No evidence of acute infarction, hemorrhage, hydrocephalus, extra-axial collection or mass lesion/mass effect. Chronic atrophic changes and white matter ischemic changes are again seen. Findings of prior infarct in the right occipital lobe, right cerebellum and high near the vertex in the right parietal lobe. These are stable in appearance from the prior exam. Vascular: No hyperdense vessel or unexpected calcification. Skull:  Normal. Negative for fracture or focal lesion. Sinuses/Orbits: No acute finding. Other: None. IMPRESSION: Chronic ischemic changes without acute abnormality. Electronically Signed   By: Oneil Devonshire M.D.   On: 07/17/2024 22:58   DG Chest Port 1 View Result Date: 07/17/2024 CLINICAL DATA:  Questionable sepsis - evaluate for abnormality EXAM: PORTABLE CHEST 1 VIEW COMPARISON:  06/02/2024 FINDINGS: Heart and mediastinal contours are within normal limits. No focal opacities or effusions. No acute bony abnormality. Aortic atherosclerosis. IMPRESSION: No active disease. Electronically Signed   By: Franky Crease M.D.   On: 07/17/2024 22:04      Time coordinating discharge: over 30 minutes  SIGNED:  Araeya Lamb DO [facility]

## 2024-08-03 NOTE — Plan of Care (Signed)
  Problem: Education: Goal: Knowledge of General Education information will improve Description: Including pain rating scale, medication(s)/side effects and non-pharmacologic comfort measures 08/03/2024 1418 by Huntley Demedeiros L, RN Outcome: Adequate for Discharge 08/03/2024 0921 by Shironda Kain L, RN Outcome: Not Progressing   Problem: Health Behavior/Discharge Planning: Goal: Ability to manage health-related needs will improve Outcome: Adequate for Discharge   Problem: Clinical Measurements: Goal: Ability to maintain clinical measurements within normal limits will improve Outcome: Adequate for Discharge Goal: Will remain free from infection Outcome: Adequate for Discharge Goal: Diagnostic test results will improve Outcome: Adequate for Discharge Goal: Respiratory complications will improve Outcome: Adequate for Discharge Goal: Cardiovascular complication will be avoided Outcome: Adequate for Discharge   Problem: Activity: Goal: Risk for activity intolerance will decrease 08/03/2024 1418 by Luan Urbani L, RN Outcome: Adequate for Discharge 08/03/2024 [id number] by Ellison Rieth L, RN Outcome: Adequate for Discharge   Problem: Nutrition: Goal: Adequate nutrition will be maintained Outcome: Adequate for Discharge   Problem: Coping: Goal: Level of anxiety will decrease Outcome: Adequate for Discharge   Problem: Elimination: Goal: Will not experience complications related to bowel motility 08/03/2024 1418 by Tully Burgo L, RN Outcome: Adequate for Discharge 08/03/2024 0921 by Kunio Cummiskey L, RN Outcome: Adequate for Discharge Goal: Will not experience complications related to urinary retention Outcome: Adequate for Discharge   Problem: Pain Managment: Goal: General experience of comfort will improve and/or be controlled Outcome: Adequate for Discharge   Problem: Safety: Goal: Ability to remain free from injury will improve Outcome: Adequate for Discharge    Problem: Skin Integrity: Goal: Risk for impaired skin integrity will decrease Outcome: Adequate for Discharge

## 2024-08-03 NOTE — Progress Notes (Signed)
 Patient given orange juice and candy for blood sugar 63. Will recheck blood sugar

## 2024-08-03 NOTE — TOC Progression Note (Addendum)
 Transition of Care [facility]) - Progression Note    Patient Details  Name: Molly Parks MRN: [id number] Date of Birth: 13-Dec-1928  [facility] of [facility] CM/SW Contact  Dalia GORMAN Fuse, RN Phone Number: 08/03/2024, 9:17 AM  Clinical Narrative:    Late Entry 8/11:TOC received a call from the patient's son, Kimberlee Shutter, he would like to go with [facility]. He confirmed the patient typically transports via EMS. TOC will continue to follow.  8/12 12:05 PM TOC outreached to the patient son Kimberlee Shutter and LVMM requesting a callback.                     Expected Discharge Plan and Services   Discharge Planning Services: CM Consult     Expected Discharge Date: 08/01/24                                     Social Drivers of Health (SDOH) Interventions SDOH Screenings   Food Insecurity: No Food Insecurity (07/18/2024)  Housing: Low Risk  (07/18/2024)  Transportation Needs: No Transportation Needs (07/18/2024)  Utilities: Not At Risk (07/18/2024)  Social Connections: Patient Declined (07/18/2024)  Tobacco Use: Low Risk  (07/18/2024)    Readmission Risk Interventions     No data to display

## 2024-08-03 NOTE — Plan of Care (Signed)
   Problem: Education: Goal: Knowledge of General Education information will improve Description: Including pain rating scale, medication(s)/side effects and non-pharmacologic comfort measures Outcome: Not Progressing

## 2024-08-03 NOTE — Plan of Care (Signed)
  Problem: Pain Managment: Goal: General experience of comfort will improve and/or be controlled Outcome: Progressing   Problem: Safety: Goal: Ability to remain free from injury will improve Outcome: Progressing

## 2024-08-03 NOTE — Progress Notes (Signed)
 Palliative Care Progress Note, Assessment & Plan   Patient Name: Molly Parks       Date: 08/03/2024 DOB: 05/16/1928  Age: 89 y.o. MRN#: [id number] Attending Physician: Marsa Edelman, DO Primary Care Physician: Eliverto Bette Hover, MD Admit Date: 07/17/2024  Subjective: Patient is lying in bed, on her right side, with a [facility] [employer] baseball.  She Is Asleep and in No Apparent Distress.  No Family or Friends Present during My Visit.  HPI: 89 y.o. female  with past medical history significant for dementia, HTN, COPD, anxiety, sacral ulcer, FTT, CKD stage III, CVA and recurrent Klebsiella UTI. Patient presented to ED 07/17/2024 from home c/o AMS and poor p.o. intake.  On scene, patient was initially unresponsive with BP of 52/30.  Improved response with BP 85/58 after 1 liter fluid.  EMS started Levophed  drip.  Patient was more alert on arrival to ED and was able to state name correctly but unable to provide additional history.   ED vital signs BP 114/70, HR 92, RR 19, SpO2 100% RA, 98.7 F. ED labs significant for Na+ 150, K+ 4.2, glucose 158, BUN 44, creatinine 1.42.  BBC 13.2, platelets 124, lactic acid 3.8, troponin 142.  CXR, CT head, CTA chest, CTAP negative.   Patient was admitted to [facility] for septic shock due to UTI, AMS, AKI on CKD stage III, elevated troponin and FTT.   Initially PMT was consulted but deferred due to patient being actively cared for by [facility] home hospice and was being followed by hospice liaison's during her admission.   PMT was reconsulted 07/25/2024 to clarify goals of care after family wish to pursue full aggressive treatment with all interventions including CPR and intubation and family refusal to continue with current home hospice provider.  Summary of  counseling/coordination of care: Extensive chart review completed prior to meeting patient including labs, vital signs, imaging, progress notes, orders, and available advanced directive documents from current and previous encounters.   After reviewing the patient's chart and assessing the patient at bedside, I attempted to assess patient's symptoms.  She awakens to my presence but then returns to sleep.  She makes no vocalizations during my visit.  Nonverbal signs of distress or discomfort such as grimacing, moaning, fidgeting, not noted.  No adjustment to [facility] needed at this time.  As per chart review, plan is for patient to discharge with hospice services at home to follow.  Goals are clear.  Symptom burden is low.  Plan is set.  PMT will step back from daily visits and monitor the patient peripherally.  Please re-engage with PMT if goals change, at patient/family's request, or if patient's health deteriorates during hospitalization.    Physical Exam Vitals reviewed.  Constitutional:      Comments: Thin  HENT:     Head:     Comments: Temporal wasting Eyes:     Pupils: Pupils are equal, round, and reactive to light.  Cardiovascular:     Rate and Rhythm: Normal rate.     Pulses: Normal pulses.  Pulmonary:     Effort: Pulmonary effort is normal.  Abdominal:     Palpations: Abdomen is soft.  Skin:    General:  Skin is warm and dry.  Psychiatric:        Behavior: Behavior normal.             Total Time 25 minutes   Time spent includes: Detailed review of medical records (labs, imaging, vital signs), medically appropriate exam (mental status, respiratory, cardiac, skin), discussed with treatment team, counseling and educating patient, family and staff, documenting clinical information, medication management and coordination of care.  Lamarr L. Arvid, DNP, FNP-BC Palliative Medicine Team

## 2024-09-22 DEATH — deceased
